# Patient Record
Sex: Male | Born: 1945 | Race: White | Hispanic: No | Marital: Single | Smoking: Former smoker
Health system: Southern US, Community
[De-identification: ages and names within clinical notes are randomized; demographics above are authoritative.]

## PROBLEM LIST (undated history)

## (undated) DIAGNOSIS — R601 Generalized edema: Secondary | ICD-10-CM

## (undated) DIAGNOSIS — E785 Hyperlipidemia, unspecified: Secondary | ICD-10-CM

## (undated) DIAGNOSIS — N400 Enlarged prostate without lower urinary tract symptoms: Secondary | ICD-10-CM

## (undated) DIAGNOSIS — I10 Essential (primary) hypertension: Secondary | ICD-10-CM

## (undated) DIAGNOSIS — G629 Polyneuropathy, unspecified: Secondary | ICD-10-CM

## (undated) DIAGNOSIS — K7460 Unspecified cirrhosis of liver: Secondary | ICD-10-CM

## (undated) HISTORY — DX: Polyneuropathy, unspecified: G62.9

## (undated) HISTORY — PX: UMBILICAL HERNIA REPAIR: SHX196

## (undated) HISTORY — DX: Unspecified cirrhosis of liver: K74.60

## (undated) HISTORY — DX: Hyperlipidemia, unspecified: E78.5

## (undated) HISTORY — DX: Benign prostatic hyperplasia without lower urinary tract symptoms: N40.0

## (undated) HISTORY — PX: HAND SURGERY: SHX662

## (undated) HISTORY — PX: OTHER SURGICAL HISTORY: SHX169

## (undated) HISTORY — DX: Essential (primary) hypertension: I10

---

## 2011-08-24 HISTORY — PX: COLONOSCOPY: SHX174

## 2015-04-27 DIAGNOSIS — I10 Essential (primary) hypertension: Secondary | ICD-10-CM | POA: Diagnosis not present

## 2015-04-27 DIAGNOSIS — Z6832 Body mass index (BMI) 32.0-32.9, adult: Secondary | ICD-10-CM | POA: Diagnosis not present

## 2015-04-27 DIAGNOSIS — E784 Other hyperlipidemia: Secondary | ICD-10-CM | POA: Diagnosis not present

## 2015-06-01 DIAGNOSIS — I10 Essential (primary) hypertension: Secondary | ICD-10-CM | POA: Diagnosis not present

## 2015-06-01 DIAGNOSIS — E785 Hyperlipidemia, unspecified: Secondary | ICD-10-CM | POA: Diagnosis not present

## 2015-06-01 DIAGNOSIS — R011 Cardiac murmur, unspecified: Secondary | ICD-10-CM | POA: Diagnosis not present

## 2015-06-01 DIAGNOSIS — M1990 Unspecified osteoarthritis, unspecified site: Secondary | ICD-10-CM | POA: Diagnosis not present

## 2015-06-01 DIAGNOSIS — E784 Other hyperlipidemia: Secondary | ICD-10-CM | POA: Diagnosis not present

## 2015-06-01 DIAGNOSIS — N200 Calculus of kidney: Secondary | ICD-10-CM | POA: Diagnosis not present

## 2015-06-01 DIAGNOSIS — R310 Gross hematuria: Secondary | ICD-10-CM | POA: Diagnosis not present

## 2015-06-01 DIAGNOSIS — I2510 Atherosclerotic heart disease of native coronary artery without angina pectoris: Secondary | ICD-10-CM | POA: Diagnosis not present

## 2015-06-02 DIAGNOSIS — R310 Gross hematuria: Secondary | ICD-10-CM | POA: Diagnosis not present

## 2015-06-02 DIAGNOSIS — N200 Calculus of kidney: Secondary | ICD-10-CM | POA: Diagnosis not present

## 2015-06-02 DIAGNOSIS — I2510 Atherosclerotic heart disease of native coronary artery without angina pectoris: Secondary | ICD-10-CM | POA: Diagnosis not present

## 2015-06-02 DIAGNOSIS — I10 Essential (primary) hypertension: Secondary | ICD-10-CM | POA: Diagnosis not present

## 2015-06-02 DIAGNOSIS — M1990 Unspecified osteoarthritis, unspecified site: Secondary | ICD-10-CM | POA: Diagnosis not present

## 2015-06-02 DIAGNOSIS — E784 Other hyperlipidemia: Secondary | ICD-10-CM | POA: Diagnosis not present

## 2015-06-02 DIAGNOSIS — E785 Hyperlipidemia, unspecified: Secondary | ICD-10-CM | POA: Diagnosis not present

## 2015-06-09 DIAGNOSIS — R310 Gross hematuria: Secondary | ICD-10-CM | POA: Diagnosis not present

## 2015-06-09 DIAGNOSIS — Z6832 Body mass index (BMI) 32.0-32.9, adult: Secondary | ICD-10-CM | POA: Diagnosis not present

## 2015-06-09 DIAGNOSIS — I2510 Atherosclerotic heart disease of native coronary artery without angina pectoris: Secondary | ICD-10-CM | POA: Diagnosis not present

## 2015-06-09 DIAGNOSIS — M160 Bilateral primary osteoarthritis of hip: Secondary | ICD-10-CM | POA: Diagnosis not present

## 2015-07-14 DIAGNOSIS — N401 Enlarged prostate with lower urinary tract symptoms: Secondary | ICD-10-CM | POA: Diagnosis not present

## 2015-07-27 DIAGNOSIS — I10 Essential (primary) hypertension: Secondary | ICD-10-CM | POA: Diagnosis not present

## 2015-07-27 DIAGNOSIS — L308 Other specified dermatitis: Secondary | ICD-10-CM | POA: Diagnosis not present

## 2015-07-27 DIAGNOSIS — Z6832 Body mass index (BMI) 32.0-32.9, adult: Secondary | ICD-10-CM | POA: Diagnosis not present

## 2015-10-26 DIAGNOSIS — Z6831 Body mass index (BMI) 31.0-31.9, adult: Secondary | ICD-10-CM | POA: Diagnosis not present

## 2015-10-26 DIAGNOSIS — Z1389 Encounter for screening for other disorder: Secondary | ICD-10-CM | POA: Diagnosis not present

## 2015-10-26 DIAGNOSIS — I10 Essential (primary) hypertension: Secondary | ICD-10-CM | POA: Diagnosis not present

## 2015-10-26 DIAGNOSIS — Z0000 Encounter for general adult medical examination without abnormal findings: Secondary | ICD-10-CM | POA: Diagnosis not present

## 2015-11-03 DIAGNOSIS — Z136 Encounter for screening for cardiovascular disorders: Secondary | ICD-10-CM | POA: Diagnosis not present

## 2015-11-03 DIAGNOSIS — Z87891 Personal history of nicotine dependence: Secondary | ICD-10-CM | POA: Diagnosis not present

## 2015-11-03 DIAGNOSIS — R1084 Generalized abdominal pain: Secondary | ICD-10-CM | POA: Diagnosis not present

## 2015-12-09 DIAGNOSIS — E7800 Pure hypercholesterolemia, unspecified: Secondary | ICD-10-CM | POA: Diagnosis not present

## 2015-12-09 DIAGNOSIS — M85852 Other specified disorders of bone density and structure, left thigh: Secondary | ICD-10-CM | POA: Diagnosis not present

## 2015-12-09 DIAGNOSIS — M85851 Other specified disorders of bone density and structure, right thigh: Secondary | ICD-10-CM | POA: Diagnosis not present

## 2015-12-09 DIAGNOSIS — I10 Essential (primary) hypertension: Secondary | ICD-10-CM | POA: Diagnosis not present

## 2015-12-09 DIAGNOSIS — M810 Age-related osteoporosis without current pathological fracture: Secondary | ICD-10-CM | POA: Diagnosis not present

## 2015-12-09 DIAGNOSIS — Z79899 Other long term (current) drug therapy: Secondary | ICD-10-CM | POA: Diagnosis not present

## 2015-12-09 DIAGNOSIS — M1990 Unspecified osteoarthritis, unspecified site: Secondary | ICD-10-CM | POA: Diagnosis not present

## 2016-01-28 DIAGNOSIS — E784 Other hyperlipidemia: Secondary | ICD-10-CM | POA: Diagnosis not present

## 2016-01-28 DIAGNOSIS — I10 Essential (primary) hypertension: Secondary | ICD-10-CM | POA: Diagnosis not present

## 2016-01-28 DIAGNOSIS — Z6830 Body mass index (BMI) 30.0-30.9, adult: Secondary | ICD-10-CM | POA: Diagnosis not present

## 2016-04-14 DIAGNOSIS — M25561 Pain in right knee: Secondary | ICD-10-CM | POA: Diagnosis not present

## 2016-04-22 DIAGNOSIS — M1712 Unilateral primary osteoarthritis, left knee: Secondary | ICD-10-CM | POA: Diagnosis not present

## 2016-05-09 DIAGNOSIS — N200 Calculus of kidney: Secondary | ICD-10-CM | POA: Diagnosis not present

## 2016-05-09 DIAGNOSIS — J440 Chronic obstructive pulmonary disease with acute lower respiratory infection: Secondary | ICD-10-CM | POA: Diagnosis not present

## 2016-05-09 DIAGNOSIS — I10 Essential (primary) hypertension: Secondary | ICD-10-CM | POA: Diagnosis not present

## 2016-05-09 DIAGNOSIS — Z6830 Body mass index (BMI) 30.0-30.9, adult: Secondary | ICD-10-CM | POA: Diagnosis not present

## 2016-05-09 DIAGNOSIS — E784 Other hyperlipidemia: Secondary | ICD-10-CM | POA: Diagnosis not present

## 2016-05-09 DIAGNOSIS — N4289 Other specified disorders of prostate: Secondary | ICD-10-CM | POA: Diagnosis not present

## 2016-05-09 DIAGNOSIS — Z125 Encounter for screening for malignant neoplasm of prostate: Secondary | ICD-10-CM | POA: Diagnosis not present

## 2016-07-26 DIAGNOSIS — Z6831 Body mass index (BMI) 31.0-31.9, adult: Secondary | ICD-10-CM | POA: Diagnosis not present

## 2016-07-26 DIAGNOSIS — M5432 Sciatica, left side: Secondary | ICD-10-CM | POA: Diagnosis not present

## 2016-07-26 DIAGNOSIS — I10 Essential (primary) hypertension: Secondary | ICD-10-CM | POA: Diagnosis not present

## 2016-07-26 DIAGNOSIS — J440 Chronic obstructive pulmonary disease with acute lower respiratory infection: Secondary | ICD-10-CM | POA: Diagnosis not present

## 2016-08-12 DIAGNOSIS — M5432 Sciatica, left side: Secondary | ICD-10-CM | POA: Diagnosis not present

## 2016-08-12 DIAGNOSIS — I10 Essential (primary) hypertension: Secondary | ICD-10-CM | POA: Diagnosis not present

## 2016-08-12 DIAGNOSIS — Z6831 Body mass index (BMI) 31.0-31.9, adult: Secondary | ICD-10-CM | POA: Diagnosis not present

## 2016-08-12 DIAGNOSIS — J440 Chronic obstructive pulmonary disease with acute lower respiratory infection: Secondary | ICD-10-CM | POA: Diagnosis not present

## 2016-09-27 ENCOUNTER — Telehealth: Payer: Self-pay

## 2016-09-27 NOTE — Telephone Encounter (Signed)
Pt was referred from the [place] for colonoscopy.  Said his last colonoscopy was 5 years ago and he had polyps. I have scheduled him an OV with Neil Crouch, PA on 11/10/2016 at 11:00 Am.  I called the number on the referral [phone] and spoke to [employer]. He always sends authorization number on his referrals. Authorization # [id number] ( on page 3 of the referral).  He is aware of the date and time of the pt's appt.

## 2016-09-27 NOTE — Telephone Encounter (Signed)
[phone] PATIENT READY TO SCHEDULE TCS

## 2016-11-10 ENCOUNTER — Encounter: Payer: Self-pay | Admitting: Gastroenterology

## 2016-11-10 ENCOUNTER — Other Ambulatory Visit: Payer: Self-pay

## 2016-11-10 ENCOUNTER — Ambulatory Visit (INDEPENDENT_AMBULATORY_CARE_PROVIDER_SITE_OTHER): Payer: Non-veteran care | Admitting: Gastroenterology

## 2016-11-10 DIAGNOSIS — Z86010 Personal history of colonic polyps: Secondary | ICD-10-CM | POA: Diagnosis not present

## 2016-11-10 MED ORDER — PEG 3350-KCL-NA BICARB-NACL 420 G PO SOLR: 4000.0000 mL | ORAL | 0 refills | Status: DC

## 2016-11-10 NOTE — Patient Instructions (Signed)
1. Colonoscopy as scheduled. See separate instructions.  

## 2016-11-10 NOTE — Assessment & Plan Note (Signed)
Colonoscopy in near future.  I have discussed the risks, alternatives, benefits with regards to but not limited to the risk of reaction to medication, bleeding, infection, perforation and the patient is agreeable to proceed. Written consent to be obtained.

## 2016-11-10 NOTE — Progress Notes (Signed)
cc'ed to pcp °

## 2016-11-10 NOTE — Progress Notes (Signed)
Primary Care Physician:  [facility]  Primary Gastroenterologist:  Garfield Cornea, MD   Chief Complaint  Patient presents with  . Colonoscopy    hx polyps, last tcs 5 yrs ago    HPI:  Mike Moreno is a 71 y.o. male here to schedule surveillance colonoscopy. Last colonoscopy in May 2013, tubular adenomatous polyps 2 removed. Recommended 5 year follow-up. He is referral from the [place] system. Clinically doing well from GI standpoint. No heartburn, dysphagia, vomiting, unintentional weight loss, abd pain, constipation, diarrhea, melena, brbpr. He has small umbilical hernia and bilateral inguinal hernias with plans for repair after his colonoscopy.   Current Outpatient Prescriptions  Medication Sig Dispense Refill  . atorvastatin (LIPITOR) 80 MG tablet Take 40 mg by mouth daily.    . benazepril (LOTENSIN) 5 MG tablet Take 5 mg by mouth daily.    . ERGOCALCIFEROL PO Take by mouth once a week.    . finasteride (PROSCAR) 5 MG tablet Take 5 mg by mouth daily.    Marland Kitchen tiZANidine (ZANAFLEX) 4 MG capsule Take 4 mg by mouth at bedtime.     No current facility-administered medications for this visit.     Allergies as of 11/10/2016  . (No Known Allergies)    Past Medical History:  Diagnosis Date  . BPH (benign prostatic hyperplasia)   . HTN (hypertension)   . Hyperlipidemia     Past Surgical History:  Procedure Laterality Date  . COLONOSCOPY  08/2011   According to [facility] notes, 2 tubular adenomatous colon polyps removed next surveillance colonoscopy in 5 years  . HAND SURGERY     traumatic injury    Family History  Problem Relation Age of Onset  . Colon cancer Neg Hx     Social History   Social History  . Marital status: Single    Spouse name: N/A  . Number of children: N/A  . Years of education: N/A   Occupational History  . Not on file.   Social History Main Topics  . Smoking status: Former Smoker    Types: Cigarettes, Cigars  . Smokeless tobacco: Never Used      Comment: quit 2015  . Alcohol use No     Comment: some etoh 20 years ago  . Drug use: No  . Sexual activity: Not on file   Other Topics Concern  . Not on file   Social History Narrative  . No narrative on file      ROS:  General: Negative for anorexia, weight loss, fever, chills, fatigue, weakness. Eyes: Negative for vision changes.  ENT: Negative for hoarseness, difficulty swallowing , nasal congestion. CV: Negative for chest pain, angina, palpitations, dyspnea on exertion, peripheral edema.  Respiratory: Negative for dyspnea at rest, dyspnea on exertion, cough, sputum, wheezing.  GI: See history of present illness. GU:  Negative for dysuria, hematuria, urinary incontinence, urinary frequency, nocturnal urination.  MS: Negative for jlow back pain. +arthritis Derm: Negative for rash or itching.  Neuro: Negative for weakness, abnormal sensation, seizure, frequent headaches, memory loss, confusion.  Psych: Negative for anxiety, depression, suicidal ideation, hallucinations.  Endo: Negative for unusual weight change.  Heme: Negative for bruising or bleeding. Allergy: Negative for rash or hives.    Physical Examination:  BP (!) 145/82   Pulse 71   Temp (!) 97 F (36.1 C) (Oral)   Ht 5\' 11"  (1.803 m)   Wt 225 lb (102.1 kg)   BMI 31.38 kg/m    General: Well-nourished,  well-developed in no acute distress.  Head: Normocephalic, atraumatic.   Eyes: Conjunctiva pink, no icterus. Mouth: Oropharyngeal mucosa moist and pink , no lesions erythema or exudate. Neck: Supple without thyromegaly, masses, or lymphadenopathy.  Lungs: Clear to auscultation bilaterally.  Heart: Regular rate and rhythm, no murmurs rubs or gallops.  Abdomen: Bowel sounds are normal, nontender, nondistended, no hepatosplenomegaly or masses, no abdominal bruits or    hernia , no rebound or guarding.   Rectal: deferred Extremities: No lower extremity edema. No clubbing or deformities.  Neuro: Alert and  oriented x 4 , grossly normal neurologically.  Skin: Warm and dry, no rash or jaundice.   Psych: Alert and cooperative, normal mood and affect.

## 2016-11-14 ENCOUNTER — Telehealth: Payer: Self-pay | Admitting: General Practice

## 2016-11-14 NOTE — Telephone Encounter (Signed)
Patient called back and stated he found his license

## 2016-11-14 NOTE — Telephone Encounter (Signed)
Patient called in stating he was here as a New Patient on 7/19th and we scanned in his Driver's license.  He has not been able to find them.  Routing to [place] to check at her desk to see if we have them.  The patient can be reached at [phone].

## 2016-11-21 DIAGNOSIS — I10 Essential (primary) hypertension: Secondary | ICD-10-CM | POA: Diagnosis not present

## 2016-11-21 DIAGNOSIS — Z6832 Body mass index (BMI) 32.0-32.9, adult: Secondary | ICD-10-CM | POA: Diagnosis not present

## 2016-11-21 DIAGNOSIS — M5432 Sciatica, left side: Secondary | ICD-10-CM | POA: Diagnosis not present

## 2016-11-21 DIAGNOSIS — J440 Chronic obstructive pulmonary disease with acute lower respiratory infection: Secondary | ICD-10-CM | POA: Diagnosis not present

## 2016-11-21 DIAGNOSIS — M12861 Other specific arthropathies, not elsewhere classified, right knee: Secondary | ICD-10-CM | POA: Diagnosis not present

## 2016-12-07 ENCOUNTER — Encounter (HOSPITAL_COMMUNITY): Payer: Self-pay | Admitting: *Deleted

## 2016-12-07 ENCOUNTER — Encounter (HOSPITAL_COMMUNITY)
Admission: RE | Disposition: A | Discharge status: Home / Self Care | Payer: Self-pay | Source: Ambulatory Visit | Attending: Internal Medicine

## 2016-12-07 ENCOUNTER — Ambulatory Visit (HOSPITAL_COMMUNITY)
Admission: RE | Admit: 2016-12-07 | Discharge: 2016-12-07 | Disposition: A | Discharge status: Home / Self Care | Payer: Non-veteran care | Source: Ambulatory Visit | Attending: Internal Medicine | Admitting: Internal Medicine

## 2016-12-07 DIAGNOSIS — K6389 Other specified diseases of intestine: Secondary | ICD-10-CM | POA: Diagnosis not present

## 2016-12-07 DIAGNOSIS — Z9889 Other specified postprocedural states: Secondary | ICD-10-CM | POA: Insufficient documentation

## 2016-12-07 DIAGNOSIS — K621 Rectal polyp: Secondary | ICD-10-CM | POA: Insufficient documentation

## 2016-12-07 DIAGNOSIS — Z79899 Other long term (current) drug therapy: Secondary | ICD-10-CM | POA: Diagnosis not present

## 2016-12-07 DIAGNOSIS — N400 Enlarged prostate without lower urinary tract symptoms: Secondary | ICD-10-CM | POA: Insufficient documentation

## 2016-12-07 DIAGNOSIS — E785 Hyperlipidemia, unspecified: Secondary | ICD-10-CM | POA: Diagnosis not present

## 2016-12-07 DIAGNOSIS — Z87891 Personal history of nicotine dependence: Secondary | ICD-10-CM | POA: Diagnosis not present

## 2016-12-07 DIAGNOSIS — I10 Essential (primary) hypertension: Secondary | ICD-10-CM | POA: Insufficient documentation

## 2016-12-07 DIAGNOSIS — Z1211 Encounter for screening for malignant neoplasm of colon: Secondary | ICD-10-CM | POA: Insufficient documentation

## 2016-12-07 DIAGNOSIS — Z86010 Personal history of colonic polyps: Secondary | ICD-10-CM | POA: Diagnosis not present

## 2016-12-07 HISTORY — PX: BIOPSY: SHX5522

## 2016-12-07 HISTORY — PX: POLYPECTOMY: SHX5525

## 2016-12-07 HISTORY — PX: COLONOSCOPY: SHX5424

## 2016-12-07 SURGERY — COLONOSCOPY
Anesthesia: Moderate Sedation

## 2016-12-07 MED ORDER — ONDANSETRON HCL 4 MG/2ML IJ SOLN
INTRAMUSCULAR | Status: DC | PRN
  Administered 2016-12-07: 4 mg via INTRAVENOUS

## 2016-12-07 MED ORDER — MEPERIDINE HCL 100 MG/ML IJ SOLN
INTRAMUSCULAR | Status: DC | PRN
  Administered 2016-12-07: 50 mg via INTRAVENOUS
  Administered 2016-12-07: 25 mg via INTRAVENOUS

## 2016-12-07 MED ORDER — MEPERIDINE HCL 100 MG/ML IJ SOLN
INTRAMUSCULAR | Status: DC
Start: 2016-12-07 — End: 2016-12-07
  Filled 2016-12-07: qty 2

## 2016-12-07 MED ORDER — ONDANSETRON HCL 4 MG/2ML IJ SOLN
INTRAMUSCULAR | Status: AC
  Filled 2016-12-07: qty 2

## 2016-12-07 MED ORDER — MIDAZOLAM HCL 5 MG/5ML IJ SOLN
INTRAMUSCULAR | Status: AC
  Filled 2016-12-07: qty 10

## 2016-12-07 MED ORDER — SODIUM CHLORIDE 0.9 % IV SOLN
INTRAVENOUS | Status: DC
  Administered 2016-12-07: 1000 mL via INTRAVENOUS

## 2016-12-07 MED ORDER — MIDAZOLAM HCL 5 MG/5ML IJ SOLN
INTRAMUSCULAR | Status: DC | PRN
  Administered 2016-12-07: 1 mg via INTRAVENOUS
  Administered 2016-12-07 (×2): 2 mg via INTRAVENOUS

## 2016-12-07 MED ORDER — SIMETHICONE 40 MG/0.6ML PO SUSP
ORAL | Status: DC | PRN
  Administered 2016-12-07: 10:00:00

## 2016-12-07 NOTE — Op Note (Signed)
[facility] Patient Name: Mike Moreno Procedure Date: 12/07/2016 9:51 AM MRN: [id number] Date of Birth: [birth year] Attending MD: Norvel Richards , MD CSN: [id number] Age: 71 Admit Type: Outpatient Procedure:                Colonoscopy Indications:              High risk colon cancer surveillance: Personal                            history of colonic polyps Providers:                Norvel Richards, MD, Otis Peak B. Gwenlyn Perking RN, RN,                            Aram Candela Referring MD:              Medicines:                Midazolam 5 mg IV, Meperidine 75 mg IV Complications:            No immediate complications. Estimated Blood Loss:     Estimated blood loss was minimal. Procedure:                Pre-Anesthesia Assessment:                           - Prior to the procedure, a History and Physical                            was performed, and patient medications and                            allergies were reviewed. The patient's tolerance of                            previous anesthesia was also reviewed. The risks                            and benefits of the procedure and the sedation                            options and risks were discussed with the patient.                            All questions were answered, and informed consent                            was obtained. Prior Anticoagulants: The patient has                            taken no previous anticoagulant or antiplatelet                            agents. ASA Grade Assessment: III - A patient with  severe systemic disease. After reviewing the risks                            and benefits, the patient was deemed in                            satisfactory condition to undergo the procedure.                           After obtaining informed consent, the colonoscope                            was passed under direct vision. Throughout the                             procedure, the patient's blood pressure, pulse, and                            oxygen saturations were monitored continuously. The                            EC-3890Li ([id number]) scope was introduced through                            the anus and advanced to the 5 cm into the ileum.                            The terminal ileum, ileocecal valve, appendiceal                            orifice, and rectum were photographed. The entire                            colon was well visualized. The colonoscopy was                            performed without difficulty. The patient tolerated                            the procedure well. The quality of the bowel                            preparation was adequate. Scope In: 10:21:03 AM Scope Out: 10:40:22 AM Scope Withdrawal Time: 0 hours 15 minutes 38 seconds  Total Procedure Duration: 0 hours 19 minutes 19 seconds  Findings:      The perianal and digital rectal examinations were normal.      A 4 mm polyp was found in the rectum. The polyp was sessile.      A localized area of mucosa in the ileocecal valve was nodular. This was       biopsied with a cold forceps for histology. Estimated blood loss was       minimal.      The exam was otherwise without abnormality on direct and retroflexion  views. . The polyp was removed with a cold snare. Resection and       retrieval were complete. Estimated blood loss was minimal Impression:               - One 4 mm polyp in the rectum, removed with a cold                            snare. Resected and retrieved.                           - Nodular ileal mucosa. Biopsied.                           - The examination was otherwise normal on direct                            and retroflexion views. Moderate Sedation:      Moderate (conscious) sedation was administered by the endoscopy nurse       and supervised by the endoscopist. The following parameters were       monitored: oxygen saturation, heart  rate, blood pressure, respiratory       rate, EKG, adequacy of pulmonary ventilation, and response to care.       Total physician intraservice time was 37 minutes. Recommendation:           - Written discharge instructions were provided to                            the patient.                           - The signs and symptoms of potential delayed                            complications were discussed with the patient.                           - Patient has a contact number available for                            emergencies.                           - Return to normal activities tomorrow.                           - Resume previous diet.                           - Continue present medications.                           - Repeat colonoscopy in 5 years for surveillance                            based on pathology results.                           -  Return to GI office (date not yet determined).                           - Repeat colonoscopy date to be determined after                            pending pathology results are reviewed for                            surveillance based on pathology results. Procedure Code(s):        --- Professional ---                           [phone], Colonoscopy, flexible; with removal of                            tumor(s), polyp(s), or other lesion(s) by snare                            technique                           45380, 59, Colonoscopy, flexible; with biopsy,                            single or multiple                           99152, Moderate sedation services provided by the                            same physician or other qualified health care                            professional performing the diagnostic or                            therapeutic service that the sedation supports,                            requiring the presence of an independent trained                            observer to assist in the monitoring of the                             patient's level of consciousness and physiological                            status; initial 15 minutes of intraservice time,                            patient age 70 years or older  99153, Moderate sedation services; each additional                            15 minutes intraservice time Diagnosis Code(s):        --- Professional ---                           Z86.010, Personal history of colonic polyps                           K62.1, Rectal polyp                           K63.89, Other specified diseases of intestine CPT copyright 2016 American Medical Association. All rights reserved. The codes documented in this report are preliminary and upon coder review may  be revised to meet current compliance requirements. Cristopher Estimable. Kristain Hu, MD Norvel Richards, MD 12/07/2016 10:50:50 AM This report has been signed electronically. Number of Addenda: 0

## 2016-12-07 NOTE — H&P (Signed)
@LOGO @   Primary Care Physician:  [facility] [place] Primary Gastroenterologist:  Dr. Gala Romney  Pre-Procedure History & Physical: HPI:  Mike Moreno is a 71 y.o. male is here for a surveillance colonoscopy. History of colon polyps removed a colonoscopy 5 years ago in [place]. Her bowel symptoms currently. Here for surveillance examination.  Past Medical History:  Diagnosis Date  . BPH (benign prostatic hyperplasia)   . HTN (hypertension)   . Hyperlipidemia     Past Surgical History:  Procedure Laterality Date  . COLONOSCOPY  08/2011   According to [facility] notes, 2 tubular adenomatous colon polyps removed next surveillance colonoscopy in 5 years  . HAND SURGERY     traumatic injury    Prior to Admission medications   Medication Sig Start Date End Date Taking? Authorizing Provider  atorvastatin (LIPITOR) 40 MG tablet Take 40 mg by mouth every evening.   Yes [provider]  benazepril (LOTENSIN) 5 MG tablet Take 5 mg by mouth daily.   Yes [provider]  finasteride (PROSCAR) 5 MG tablet Take 5 mg by mouth daily.   Yes [provider]  polyethylene glycol-electrolytes (TRILYTE) 420 g solution Take 4,000 mLs by mouth as directed. 11/10/16  Yes Gerrell Tabet, Cristopher Estimable, MD  tiZANidine (ZANAFLEX) 4 MG tablet Take 4 mg by mouth at bedtime. 11/01/16  Yes [provider]  Vitamin D, Ergocalciferol, (DRISDOL) 50000 units CAPS capsule Take 50,000 Units by mouth every Wednesday.   Yes [provider]    Allergies as of 11/10/2016  . (No Known Allergies)    Family History  Problem Relation Age of Onset  . Colon cancer Neg Hx     Social History   Social History  . Marital status: Single    Spouse name: N/A  . Number of children: N/A  . Years of education: N/A   Occupational History  . Not on file.   Social History Main Topics  . Smoking status: Former Smoker    Types: Cigarettes, Cigars  . Smokeless tobacco: Never Used   Comment: quit 2015  . Alcohol use No     Comment: some etoh 20 years ago  . Drug use: No     Comment: not since 2005  . Sexual activity: Not on file   Other Topics Concern  . Not on file   Social History Narrative  . No narrative on file    Review of Systems: See HPI, otherwise negative ROS  Physical Exam: BP (!) 155/77   Pulse 74   Temp 98.7 F (37.1 C) (Oral)   Resp 20   Ht 5\' 11"  (1.803 m)   Wt 214 lb (97.1 kg)   SpO2 99%   BMI 29.85 kg/m  General:   Alert,  Well-developed, well-nourished, pleasant and cooperative in NAD Lungs:  Clear throughout to auscultation.   No wheezes, crackles, or rhonchi. No acute distress. Heart:  Regular rate and rhythm; no murmurs, clicks, rubs,  or gallops. Abdomen:  Soft, nontender and nondistended. No masses, hepatosplenomegaly or hernias noted. Normal bowel sounds, without guarding, and without rebound.    Impression/Plan: Mike Moreno is now here to undergo a Surveillance colonoscopy. Risks, benefits, limitations, imponderables and alternatives regarding colonoscopy have been reviewed with the patient. Questions have been answered. All parties agreeable.        Notice:  This dictation was prepared with Dragon dictation along with smaller phrase technology. Any transcriptional errors that result from this process are unintentional  and may not be corrected upon review.

## 2016-12-07 NOTE — Discharge Instructions (Addendum)
Colon Polyps °Polyps are tissue growths inside the body. Polyps can grow in many places, including the large intestine (colon). A polyp may be a round bump or a mushroom-shaped growth. You could have one polyp or several. °Most colon polyps are noncancerous (benign). However, some colon polyps can become cancerous over time. °What are the causes? °The exact cause of colon polyps is not known. °What increases the risk? °This condition is more likely to develop in people who: °· Have a family history of colon cancer or colon polyps. °· Are older than 50 or older than 45 if they are African American. °· Have inflammatory bowel disease, such as ulcerative colitis or Crohn disease. °· Are overweight. °· Smoke cigarettes. °· Do not get enough exercise. °· Drink too much alcohol. °· Eat a diet that is: °? High in fat and red meat. °? Low in fiber. °· Had childhood cancer that was treated with abdominal radiation. ° °What are the signs or symptoms? °Most polyps do not cause symptoms. If you have symptoms, they may include: °· Blood coming from your rectum when having a bowel movement. °· Blood in your stool. The stool may look dark red or black. °· A change in bowel habits, such as constipation or diarrhea. ° °How is this diagnosed? °This condition is diagnosed with a colonoscopy. This is a procedure that uses a lighted, flexible scope to look at the inside of your colon. °How is this treated? °Treatment for this condition involves removing any polyps that are found. Those polyps will then be tested for cancer. If cancer is found, your health care provider will talk to you about options for colon cancer treatment. °Follow these instructions at home: °Diet °· Eat plenty of fiber, such as fruits, vegetables, and whole grains. °· Eat foods that are high in calcium and vitamin D, such as milk, cheese, yogurt, eggs, liver, fish, and broccoli. °· Limit foods high in fat, red meats, and processed meats, such as hot dogs, sausage,  bacon, and lunch meats. °· Maintain a healthy weight, or lose weight if recommended by your health care provider. °General instructions °· Do not smoke cigarettes. °· Do not drink alcohol excessively. °· Keep all follow-up visits as told by your health care provider. This is important. This includes keeping regularly scheduled colonoscopies. Talk to your health care provider about when you need a colonoscopy. °· Exercise every day or as told by your health care provider. °Contact a health care provider if: °· You have new or worsening bleeding during a bowel movement. °· You have new or increased blood in your stool. °· You have a change in bowel habits. °· You unexpectedly lose weight. °This information is not intended to replace advice given to you by your health care provider. Make sure you discuss any questions you have with your health care provider. °Document Released: 01/06/2004 Document Revised: 09/17/2015 Document Reviewed: 03/02/2015 °Elsevier Interactive Patient Education © 2018 [employer] ° °Colonoscopy °Discharge Instructions ° °Read the instructions outlined below and refer to this sheet in the next few weeks. These discharge instructions provide you with general information on caring for yourself after you leave the hospital. Your doctor may also give you specific instructions. While your treatment has been planned according to the most current medical practices available, unavoidable complications occasionally occur. If you have any problems or questions after discharge, call Dr. Alwin Lanigan at [phone]. °ACTIVITY °· You may resume your regular activity, but move at a slower pace for the next 24   hours.   Take frequent rest periods for the next 24 hours.   Walking will help get rid of the air and reduce the bloated feeling in your belly (abdomen).   No driving for 24 hours (because of the medicine (anesthesia) used during the test).    Do not sign any important legal documents or operate any  machinery for 24 hours (because of the anesthesia used during the test).  NUTRITION  Drink plenty of fluids.   You may resume your normal diet as instructed by your doctor.   Begin with a light meal and progress to your normal diet. Heavy or fried foods are harder to digest and may make you feel sick to your stomach (nauseated).   Avoid alcoholic beverages for 24 hours or as instructed.  MEDICATIONS  You may resume your normal medications unless your doctor tells you otherwise.  WHAT YOU CAN EXPECT TODAY  Some feelings of bloating in the abdomen.   Passage of more gas than usual.   Spotting of blood in your stool or on the toilet paper.  IF YOU HAD POLYPS REMOVED DURING THE COLONOSCOPY:  No aspirin products for 7 days or as instructed.   No alcohol for 7 days or as instructed.   Eat a soft diet for the next 24 hours.  FINDING OUT THE RESULTS OF YOUR TEST Not all test results are available during your visit. If your test results are not back during the visit, make an appointment with your caregiver to find out the results. Do not assume everything is normal if you have not heard from your caregiver or the medical facility. It is important for you to follow up on all of your test results.  SEEK IMMEDIATE MEDICAL ATTENTION IF:  You have more than a spotting of blood in your stool.   Your belly is swollen (abdominal distention).   You are nauseated or vomiting.   You have a temperature over 101.   You have abdominal pain or discomfort that is severe or gets worse throughout the day.    Colon polyp information provided - one small polyp removed from your rectum and mild nodularity where your small intestine next to your colon  -  biopsied  Further recommendations to follow pending review of pathology report

## 2016-12-08 ENCOUNTER — Encounter (HOSPITAL_COMMUNITY): Payer: Self-pay | Admitting: Internal Medicine

## 2016-12-09 ENCOUNTER — Encounter: Payer: Self-pay | Admitting: Internal Medicine

## 2017-02-03 DIAGNOSIS — R609 Edema, unspecified: Secondary | ICD-10-CM | POA: Diagnosis not present

## 2017-02-03 DIAGNOSIS — E7800 Pure hypercholesterolemia, unspecified: Secondary | ICD-10-CM | POA: Diagnosis not present

## 2017-02-03 DIAGNOSIS — M1990 Unspecified osteoarthritis, unspecified site: Secondary | ICD-10-CM | POA: Diagnosis not present

## 2017-02-03 DIAGNOSIS — R600 Localized edema: Secondary | ICD-10-CM | POA: Diagnosis not present

## 2017-02-03 DIAGNOSIS — R109 Unspecified abdominal pain: Secondary | ICD-10-CM | POA: Diagnosis not present

## 2017-02-03 DIAGNOSIS — Z79899 Other long term (current) drug therapy: Secondary | ICD-10-CM | POA: Diagnosis not present

## 2017-02-03 DIAGNOSIS — K5900 Constipation, unspecified: Secondary | ICD-10-CM | POA: Diagnosis not present

## 2017-02-03 DIAGNOSIS — Z87891 Personal history of nicotine dependence: Secondary | ICD-10-CM | POA: Diagnosis not present

## 2017-02-03 DIAGNOSIS — R300 Dysuria: Secondary | ICD-10-CM | POA: Diagnosis not present

## 2017-02-27 DIAGNOSIS — J441 Chronic obstructive pulmonary disease with (acute) exacerbation: Secondary | ICD-10-CM | POA: Diagnosis not present

## 2017-02-27 DIAGNOSIS — J440 Chronic obstructive pulmonary disease with acute lower respiratory infection: Secondary | ICD-10-CM | POA: Diagnosis not present

## 2017-02-27 DIAGNOSIS — I10 Essential (primary) hypertension: Secondary | ICD-10-CM | POA: Diagnosis not present

## 2017-02-27 DIAGNOSIS — Z6831 Body mass index (BMI) 31.0-31.9, adult: Secondary | ICD-10-CM | POA: Diagnosis not present

## 2017-02-27 DIAGNOSIS — E7849 Other hyperlipidemia: Secondary | ICD-10-CM | POA: Diagnosis not present

## 2017-06-05 DIAGNOSIS — E7849 Other hyperlipidemia: Secondary | ICD-10-CM | POA: Diagnosis not present

## 2017-06-05 DIAGNOSIS — Z6832 Body mass index (BMI) 32.0-32.9, adult: Secondary | ICD-10-CM | POA: Diagnosis not present

## 2017-06-05 DIAGNOSIS — Z6831 Body mass index (BMI) 31.0-31.9, adult: Secondary | ICD-10-CM | POA: Diagnosis not present

## 2017-06-05 DIAGNOSIS — J441 Chronic obstructive pulmonary disease with (acute) exacerbation: Secondary | ICD-10-CM | POA: Diagnosis not present

## 2017-06-05 DIAGNOSIS — I10 Essential (primary) hypertension: Secondary | ICD-10-CM | POA: Diagnosis not present

## 2017-06-05 DIAGNOSIS — M170 Bilateral primary osteoarthritis of knee: Secondary | ICD-10-CM | POA: Diagnosis not present

## 2017-09-11 DIAGNOSIS — I10 Essential (primary) hypertension: Secondary | ICD-10-CM | POA: Diagnosis not present

## 2017-09-11 DIAGNOSIS — Z6832 Body mass index (BMI) 32.0-32.9, adult: Secondary | ICD-10-CM | POA: Diagnosis not present

## 2017-09-11 DIAGNOSIS — J441 Chronic obstructive pulmonary disease with (acute) exacerbation: Secondary | ICD-10-CM | POA: Diagnosis not present

## 2017-09-11 DIAGNOSIS — E7849 Other hyperlipidemia: Secondary | ICD-10-CM | POA: Diagnosis not present

## 2017-09-11 DIAGNOSIS — M170 Bilateral primary osteoarthritis of knee: Secondary | ICD-10-CM | POA: Diagnosis not present

## 2017-09-11 DIAGNOSIS — N182 Chronic kidney disease, stage 2 (mild): Secondary | ICD-10-CM | POA: Diagnosis not present

## 2017-12-26 DIAGNOSIS — N182 Chronic kidney disease, stage 2 (mild): Secondary | ICD-10-CM | POA: Diagnosis not present

## 2017-12-26 DIAGNOSIS — Z6832 Body mass index (BMI) 32.0-32.9, adult: Secondary | ICD-10-CM | POA: Diagnosis not present

## 2017-12-26 DIAGNOSIS — Z0000 Encounter for general adult medical examination without abnormal findings: Secondary | ICD-10-CM | POA: Diagnosis not present

## 2017-12-26 DIAGNOSIS — I10 Essential (primary) hypertension: Secondary | ICD-10-CM | POA: Diagnosis not present

## 2017-12-26 DIAGNOSIS — E7849 Other hyperlipidemia: Secondary | ICD-10-CM | POA: Diagnosis not present

## 2017-12-26 DIAGNOSIS — J441 Chronic obstructive pulmonary disease with (acute) exacerbation: Secondary | ICD-10-CM | POA: Diagnosis not present

## 2017-12-26 DIAGNOSIS — J449 Chronic obstructive pulmonary disease, unspecified: Secondary | ICD-10-CM | POA: Diagnosis not present

## 2017-12-26 DIAGNOSIS — M170 Bilateral primary osteoarthritis of knee: Secondary | ICD-10-CM | POA: Diagnosis not present

## 2018-01-03 DIAGNOSIS — I82439 Acute embolism and thrombosis of unspecified popliteal vein: Secondary | ICD-10-CM | POA: Diagnosis not present

## 2018-01-03 DIAGNOSIS — I82432 Acute embolism and thrombosis of left popliteal vein: Secondary | ICD-10-CM | POA: Diagnosis not present

## 2018-02-18 DIAGNOSIS — E785 Hyperlipidemia, unspecified: Secondary | ICD-10-CM | POA: Diagnosis not present

## 2018-02-18 DIAGNOSIS — R079 Chest pain, unspecified: Secondary | ICD-10-CM | POA: Diagnosis not present

## 2018-02-18 DIAGNOSIS — N179 Acute kidney failure, unspecified: Secondary | ICD-10-CM | POA: Diagnosis not present

## 2018-02-18 DIAGNOSIS — I10 Essential (primary) hypertension: Secondary | ICD-10-CM | POA: Diagnosis not present

## 2018-02-18 DIAGNOSIS — I82502 Chronic embolism and thrombosis of unspecified deep veins of left lower extremity: Secondary | ICD-10-CM | POA: Diagnosis not present

## 2018-02-18 DIAGNOSIS — I2510 Atherosclerotic heart disease of native coronary artery without angina pectoris: Secondary | ICD-10-CM | POA: Diagnosis not present

## 2018-02-18 DIAGNOSIS — M1990 Unspecified osteoarthritis, unspecified site: Secondary | ICD-10-CM | POA: Diagnosis not present

## 2018-02-18 DIAGNOSIS — I129 Hypertensive chronic kidney disease with stage 1 through stage 4 chronic kidney disease, or unspecified chronic kidney disease: Secondary | ICD-10-CM | POA: Diagnosis not present

## 2018-02-18 DIAGNOSIS — R0789 Other chest pain: Secondary | ICD-10-CM | POA: Diagnosis not present

## 2018-02-18 DIAGNOSIS — G629 Polyneuropathy, unspecified: Secondary | ICD-10-CM | POA: Diagnosis not present

## 2018-02-18 DIAGNOSIS — R609 Edema, unspecified: Secondary | ICD-10-CM | POA: Diagnosis not present

## 2018-02-18 DIAGNOSIS — Z87891 Personal history of nicotine dependence: Secondary | ICD-10-CM | POA: Diagnosis not present

## 2018-02-18 DIAGNOSIS — Z87442 Personal history of urinary calculi: Secondary | ICD-10-CM | POA: Diagnosis not present

## 2018-02-18 DIAGNOSIS — N182 Chronic kidney disease, stage 2 (mild): Secondary | ICD-10-CM | POA: Diagnosis not present

## 2018-02-19 DIAGNOSIS — R0789 Other chest pain: Secondary | ICD-10-CM | POA: Diagnosis not present

## 2018-02-19 DIAGNOSIS — E785 Hyperlipidemia, unspecified: Secondary | ICD-10-CM | POA: Diagnosis not present

## 2018-02-19 DIAGNOSIS — I129 Hypertensive chronic kidney disease with stage 1 through stage 4 chronic kidney disease, or unspecified chronic kidney disease: Secondary | ICD-10-CM | POA: Diagnosis not present

## 2018-02-19 DIAGNOSIS — N182 Chronic kidney disease, stage 2 (mild): Secondary | ICD-10-CM | POA: Diagnosis not present

## 2018-02-19 DIAGNOSIS — I2510 Atherosclerotic heart disease of native coronary artery without angina pectoris: Secondary | ICD-10-CM | POA: Diagnosis not present

## 2018-02-26 DIAGNOSIS — M169 Osteoarthritis of hip, unspecified: Secondary | ICD-10-CM | POA: Diagnosis not present

## 2018-02-26 DIAGNOSIS — Z1389 Encounter for screening for other disorder: Secondary | ICD-10-CM | POA: Diagnosis not present

## 2018-02-26 DIAGNOSIS — N182 Chronic kidney disease, stage 2 (mild): Secondary | ICD-10-CM | POA: Diagnosis not present

## 2018-02-26 DIAGNOSIS — Z6834 Body mass index (BMI) 34.0-34.9, adult: Secondary | ICD-10-CM | POA: Diagnosis not present

## 2018-02-26 DIAGNOSIS — E782 Mixed hyperlipidemia: Secondary | ICD-10-CM | POA: Diagnosis not present

## 2018-02-26 DIAGNOSIS — I82409 Acute embolism and thrombosis of unspecified deep veins of unspecified lower extremity: Secondary | ICD-10-CM | POA: Diagnosis not present

## 2018-02-26 DIAGNOSIS — Z0000 Encounter for general adult medical examination without abnormal findings: Secondary | ICD-10-CM | POA: Diagnosis not present

## 2018-02-26 DIAGNOSIS — I10 Essential (primary) hypertension: Secondary | ICD-10-CM | POA: Diagnosis not present

## 2018-06-04 DIAGNOSIS — Z6834 Body mass index (BMI) 34.0-34.9, adult: Secondary | ICD-10-CM | POA: Diagnosis not present

## 2018-06-04 DIAGNOSIS — I82409 Acute embolism and thrombosis of unspecified deep veins of unspecified lower extremity: Secondary | ICD-10-CM | POA: Diagnosis not present

## 2018-06-04 DIAGNOSIS — Z0000 Encounter for general adult medical examination without abnormal findings: Secondary | ICD-10-CM | POA: Diagnosis not present

## 2018-06-04 DIAGNOSIS — N182 Chronic kidney disease, stage 2 (mild): Secondary | ICD-10-CM | POA: Diagnosis not present

## 2018-06-04 DIAGNOSIS — Z125 Encounter for screening for malignant neoplasm of prostate: Secondary | ICD-10-CM | POA: Diagnosis not present

## 2018-06-04 DIAGNOSIS — Z1389 Encounter for screening for other disorder: Secondary | ICD-10-CM | POA: Diagnosis not present

## 2018-06-04 DIAGNOSIS — E782 Mixed hyperlipidemia: Secondary | ICD-10-CM | POA: Diagnosis not present

## 2018-06-04 DIAGNOSIS — I10 Essential (primary) hypertension: Secondary | ICD-10-CM | POA: Diagnosis not present

## 2018-06-04 DIAGNOSIS — M169 Osteoarthritis of hip, unspecified: Secondary | ICD-10-CM | POA: Diagnosis not present

## 2018-09-11 DIAGNOSIS — I10 Essential (primary) hypertension: Secondary | ICD-10-CM | POA: Diagnosis not present

## 2018-09-11 DIAGNOSIS — N182 Chronic kidney disease, stage 2 (mild): Secondary | ICD-10-CM | POA: Diagnosis not present

## 2018-09-11 DIAGNOSIS — Z6834 Body mass index (BMI) 34.0-34.9, adult: Secondary | ICD-10-CM | POA: Diagnosis not present

## 2018-09-11 DIAGNOSIS — E782 Mixed hyperlipidemia: Secondary | ICD-10-CM | POA: Diagnosis not present

## 2018-09-11 DIAGNOSIS — M169 Osteoarthritis of hip, unspecified: Secondary | ICD-10-CM | POA: Diagnosis not present

## 2018-09-11 DIAGNOSIS — I82413 Acute embolism and thrombosis of femoral vein, bilateral: Secondary | ICD-10-CM | POA: Diagnosis not present

## 2018-12-24 DIAGNOSIS — E782 Mixed hyperlipidemia: Secondary | ICD-10-CM | POA: Diagnosis not present

## 2018-12-24 DIAGNOSIS — M169 Osteoarthritis of hip, unspecified: Secondary | ICD-10-CM | POA: Diagnosis not present

## 2018-12-24 DIAGNOSIS — Z6833 Body mass index (BMI) 33.0-33.9, adult: Secondary | ICD-10-CM | POA: Diagnosis not present

## 2018-12-24 DIAGNOSIS — N182 Chronic kidney disease, stage 2 (mild): Secondary | ICD-10-CM | POA: Diagnosis not present

## 2018-12-24 DIAGNOSIS — I82502 Chronic embolism and thrombosis of unspecified deep veins of left lower extremity: Secondary | ICD-10-CM | POA: Diagnosis not present

## 2018-12-24 DIAGNOSIS — I10 Essential (primary) hypertension: Secondary | ICD-10-CM | POA: Diagnosis not present

## 2019-01-01 DIAGNOSIS — Z20828 Contact with and (suspected) exposure to other viral communicable diseases: Secondary | ICD-10-CM | POA: Diagnosis not present

## 2019-01-01 DIAGNOSIS — Z79899 Other long term (current) drug therapy: Secondary | ICD-10-CM | POA: Diagnosis not present

## 2019-01-01 DIAGNOSIS — Z86718 Personal history of other venous thrombosis and embolism: Secondary | ICD-10-CM | POA: Diagnosis not present

## 2019-01-01 DIAGNOSIS — I2510 Atherosclerotic heart disease of native coronary artery without angina pectoris: Secondary | ICD-10-CM | POA: Diagnosis not present

## 2019-01-01 DIAGNOSIS — E785 Hyperlipidemia, unspecified: Secondary | ICD-10-CM | POA: Diagnosis not present

## 2019-01-01 DIAGNOSIS — M1990 Unspecified osteoarthritis, unspecified site: Secondary | ICD-10-CM | POA: Diagnosis not present

## 2019-01-01 DIAGNOSIS — N182 Chronic kidney disease, stage 2 (mild): Secondary | ICD-10-CM | POA: Diagnosis not present

## 2019-01-01 DIAGNOSIS — J180 Bronchopneumonia, unspecified organism: Secondary | ICD-10-CM | POA: Diagnosis not present

## 2019-01-01 DIAGNOSIS — J189 Pneumonia, unspecified organism: Secondary | ICD-10-CM | POA: Diagnosis not present

## 2019-01-01 DIAGNOSIS — I129 Hypertensive chronic kidney disease with stage 1 through stage 4 chronic kidney disease, or unspecified chronic kidney disease: Secondary | ICD-10-CM | POA: Diagnosis not present

## 2019-01-02 DIAGNOSIS — Z79899 Other long term (current) drug therapy: Secondary | ICD-10-CM | POA: Diagnosis not present

## 2019-01-02 DIAGNOSIS — N182 Chronic kidney disease, stage 2 (mild): Secondary | ICD-10-CM | POA: Diagnosis not present

## 2019-01-02 DIAGNOSIS — M1990 Unspecified osteoarthritis, unspecified site: Secondary | ICD-10-CM | POA: Diagnosis not present

## 2019-01-02 DIAGNOSIS — Z86718 Personal history of other venous thrombosis and embolism: Secondary | ICD-10-CM | POA: Diagnosis not present

## 2019-01-02 DIAGNOSIS — I2510 Atherosclerotic heart disease of native coronary artery without angina pectoris: Secondary | ICD-10-CM | POA: Diagnosis not present

## 2019-01-02 DIAGNOSIS — I129 Hypertensive chronic kidney disease with stage 1 through stage 4 chronic kidney disease, or unspecified chronic kidney disease: Secondary | ICD-10-CM | POA: Diagnosis not present

## 2019-01-02 DIAGNOSIS — E785 Hyperlipidemia, unspecified: Secondary | ICD-10-CM | POA: Diagnosis not present

## 2019-01-02 DIAGNOSIS — Z20828 Contact with and (suspected) exposure to other viral communicable diseases: Secondary | ICD-10-CM | POA: Diagnosis not present

## 2019-01-02 DIAGNOSIS — J189 Pneumonia, unspecified organism: Secondary | ICD-10-CM | POA: Diagnosis not present

## 2019-01-03 DIAGNOSIS — Z20828 Contact with and (suspected) exposure to other viral communicable diseases: Secondary | ICD-10-CM | POA: Diagnosis not present

## 2019-01-03 DIAGNOSIS — Z79899 Other long term (current) drug therapy: Secondary | ICD-10-CM | POA: Diagnosis not present

## 2019-01-03 DIAGNOSIS — N182 Chronic kidney disease, stage 2 (mild): Secondary | ICD-10-CM | POA: Diagnosis not present

## 2019-01-03 DIAGNOSIS — E785 Hyperlipidemia, unspecified: Secondary | ICD-10-CM | POA: Diagnosis not present

## 2019-01-03 DIAGNOSIS — M1990 Unspecified osteoarthritis, unspecified site: Secondary | ICD-10-CM | POA: Diagnosis not present

## 2019-01-03 DIAGNOSIS — I2510 Atherosclerotic heart disease of native coronary artery without angina pectoris: Secondary | ICD-10-CM | POA: Diagnosis not present

## 2019-01-03 DIAGNOSIS — J189 Pneumonia, unspecified organism: Secondary | ICD-10-CM | POA: Diagnosis not present

## 2019-01-03 DIAGNOSIS — Z86718 Personal history of other venous thrombosis and embolism: Secondary | ICD-10-CM | POA: Diagnosis not present

## 2019-01-03 DIAGNOSIS — I129 Hypertensive chronic kidney disease with stage 1 through stage 4 chronic kidney disease, or unspecified chronic kidney disease: Secondary | ICD-10-CM | POA: Diagnosis not present

## 2019-01-14 DIAGNOSIS — Z6833 Body mass index (BMI) 33.0-33.9, adult: Secondary | ICD-10-CM | POA: Diagnosis not present

## 2019-01-14 DIAGNOSIS — J168 Pneumonia due to other specified infectious organisms: Secondary | ICD-10-CM | POA: Diagnosis not present

## 2019-02-19 DIAGNOSIS — N182 Chronic kidney disease, stage 2 (mild): Secondary | ICD-10-CM | POA: Diagnosis not present

## 2019-02-19 DIAGNOSIS — I5033 Acute on chronic diastolic (congestive) heart failure: Secondary | ICD-10-CM | POA: Diagnosis not present

## 2019-02-19 DIAGNOSIS — Z6833 Body mass index (BMI) 33.0-33.9, adult: Secondary | ICD-10-CM | POA: Diagnosis not present

## 2019-02-19 DIAGNOSIS — M1009 Idiopathic gout, multiple sites: Secondary | ICD-10-CM | POA: Diagnosis not present

## 2019-02-19 DIAGNOSIS — I825Z2 Chronic embolism and thrombosis of unspecified deep veins of left distal lower extremity: Secondary | ICD-10-CM | POA: Diagnosis not present

## 2019-02-19 DIAGNOSIS — R7303 Prediabetes: Secondary | ICD-10-CM | POA: Diagnosis not present

## 2019-03-06 DIAGNOSIS — Z6832 Body mass index (BMI) 32.0-32.9, adult: Secondary | ICD-10-CM | POA: Diagnosis not present

## 2019-03-06 DIAGNOSIS — N62 Hypertrophy of breast: Secondary | ICD-10-CM | POA: Diagnosis not present

## 2019-03-20 DIAGNOSIS — N182 Chronic kidney disease, stage 2 (mild): Secondary | ICD-10-CM | POA: Diagnosis not present

## 2019-03-20 DIAGNOSIS — I825Z2 Chronic embolism and thrombosis of unspecified deep veins of left distal lower extremity: Secondary | ICD-10-CM | POA: Diagnosis not present

## 2019-03-20 DIAGNOSIS — E7849 Other hyperlipidemia: Secondary | ICD-10-CM | POA: Diagnosis not present

## 2019-03-20 DIAGNOSIS — I10 Essential (primary) hypertension: Secondary | ICD-10-CM | POA: Diagnosis not present

## 2019-03-20 DIAGNOSIS — Z0000 Encounter for general adult medical examination without abnormal findings: Secondary | ICD-10-CM | POA: Diagnosis not present

## 2019-03-20 DIAGNOSIS — Z1389 Encounter for screening for other disorder: Secondary | ICD-10-CM | POA: Diagnosis not present

## 2019-03-20 DIAGNOSIS — Z6833 Body mass index (BMI) 33.0-33.9, adult: Secondary | ICD-10-CM | POA: Diagnosis not present

## 2019-03-20 DIAGNOSIS — I5032 Chronic diastolic (congestive) heart failure: Secondary | ICD-10-CM | POA: Diagnosis not present

## 2019-03-27 DIAGNOSIS — R928 Other abnormal and inconclusive findings on diagnostic imaging of breast: Secondary | ICD-10-CM | POA: Diagnosis not present

## 2019-03-27 DIAGNOSIS — N62 Hypertrophy of breast: Secondary | ICD-10-CM | POA: Diagnosis not present

## 2019-05-01 DIAGNOSIS — Z6833 Body mass index (BMI) 33.0-33.9, adult: Secondary | ICD-10-CM | POA: Diagnosis not present

## 2019-05-01 DIAGNOSIS — N3001 Acute cystitis with hematuria: Secondary | ICD-10-CM | POA: Diagnosis not present

## 2019-06-06 ENCOUNTER — Inpatient Hospital Stay: Admit: 2019-06-06 | Discharge: 2019-06-06 | Payer: PRIVATE HEALTH INSURANCE | Primary: Family Medicine

## 2019-06-06 LAB — COMPREHENSIVE METABOLIC PANEL
BKR ALANINE AMINOTRANSFERASE (ALT): 32 U/L (ref 16–61)
BKR ALBUMIN: 3.6 g/dL (ref 3.4–5.0)
BKR ALKALINE PHOSPHATASE: 73 U/L (ref 45–117)
BKR ANION GAP (LM): 7 mmol/L (ref 5–15)
BKR ASPARTATE AMINOTRANSFERASE (AST): 21 U/L (ref 15–37)
BKR BILIRUBIN TOTAL: 0.8 mg/dL (ref <1.0)
BKR BLOOD UREA NITROGEN: 18 mg/dL (ref 7–18)
BKR CALCIUM: 8.9 mg/dL (ref 8.5–10.1)
BKR CHLORIDE: 108 mmol/L — ABNORMAL HIGH (ref 98–107)
BKR CO2: 25 mmol/L (ref 21–32)
BKR CREATININE: 0.82 mg/dL (ref 0.70–1.30)
BKR EGFR (AFR AMER) (LMC): >60 mL/min/{1.73_m2} (ref >60)
BKR EGFR (NON AFR AMER) (LMC): >60 mL/min/{1.73_m2} (ref >60)
BKR GLOBULIN: 3.3 g/dL (ref 2.5–5.0)
BKR GLUCOSE: 81 mg/dL (ref 65–110)
BKR POTASSIUM: 4.1 mmol/L (ref 3.5–5.1)
BKR PROTEIN TOTAL: 6.9 g/dL (ref 6.4–8.2)
BKR SODIUM: 140 mmol/L (ref 136–145)

## 2019-06-06 LAB — CBC WITH AUTO DIFFERENTIAL
BKR WAM ABSOLUTE IMMATURE GRANULOCYTES.: 0.01 x 1000/ÂµL (ref 0.00–0.10)
BKR WAM ABSOLUTE LYMPHOCYTE COUNT.: 0.86 x 1000/ÂµL — ABNORMAL LOW (ref 1.00–4.50)
BKR WAM ABSOLUTE NEUTROPHIL COUNT.: 3.85 x 1000/ÂµL (ref 1.40–6.60)
BKR WAM BASOPHIL ABSOLUTE COUNT.: 0.06 x 1000/ÂµL (ref 0.0–0.3)
BKR WAM BASOPHILS: 1.1 % (ref 0.0–3.0)
BKR WAM EOSINOPHIL ABSOLUTE COUNT.: 0.15 x 1000/ÂµL (ref 0.00–0.45)
BKR WAM EOSINOPHILS: 2.8 % (ref 0.0–4.0)
BKR WAM HEMATOCRIT: 52.5 % — ABNORMAL HIGH (ref 40.0–50.0)
BKR WAM HEMOGLOBIN: 17.2 g/dL — ABNORMAL HIGH (ref 13.0–17.0)
BKR WAM IMMATURE GRANULOCYTES: 0.2 % (ref 0.0–1.0)
BKR WAM LYMPHOCYTES: 15.8 % — ABNORMAL LOW (ref 25.0–45.0)
BKR WAM MCH PG: 31.6 pg (ref 27–33)
BKR WAM MCHC: 32.8 g/dL (ref 32.0–36.0)
BKR WAM MCV: 96.3 fL (ref 79.0–99.0)
BKR WAM MONOCYTE ABSOLUTE COUNT.: 0.51 x 1000/ÂµL (ref 0.00–1.20)
BKR WAM MONOCYTES: 9.4 % (ref 0.0–12.0)
BKR WAM MPV: 10.7 fL (ref 7.5–11.5)
BKR WAM NEUTROPHILS: 70.7 % — ABNORMAL HIGH (ref 36.0–66.0)
BKR WAM NUCLEATED RED BLOOD CELLS: 0 % (ref 0.0–5.0)
BKR WAM PLATELETS: 148 x1000/ÂµL (ref 140–400)
BKR WAM RDW-CV: 12.9 % (ref 11.5–14.5)
BKR WAM RED BLOOD CELL COUNT.: 5.45 M/ÂµL — ABNORMAL HIGH (ref 4.00–5.20)
BKR WAM WHITE BLOOD CELL COUNT.: 5.44 x1000/ÂµL (ref 4.00–10.00)

## 2019-06-06 LAB — PSA, TOTAL (SCREENING) (BH GH L LMW YH): BKR PROSTATE SPECIFIC ANTIGEN, SCREENING: 0.7 ng/mL (ref <4.000)

## 2019-06-06 LAB — LIPID PANEL
BKR CHOLESTEROL: 146 mg/dL
BKR HDL CHOLESTEROL: 47 mg/dL
BKR LDL CHOLESTEROL CALCULATED: 78 mg/dL
BKR TRIGLYCERIDES: 106 mg/dL

## 2019-06-06 LAB — TSH W/REFLEX TO FT4     (BH GH LMW Q YH): BKR THYROID STIMULATING HORMONE (LM): 0.65 u[IU]/mL (ref 0.36–3.74)

## 2019-06-06 MED ORDER — LORATADINE 10 MG TABLET
10 | ORAL | 0.00 refills | 75.00000 days | Status: AC
Start: 2019-06-06 — End: ?

## 2019-06-06 NOTE — Other
All good

## 2019-06-07 NOTE — Other
Good morning, called patient and relayed message and patient was pleased. Thank you.

## 2019-06-14 MED ORDER — SILDENAFIL 100 MG TABLET
100 | ORAL_TABLET | Freq: Every day | ORAL | 4 refills | 30.00000 days | Status: AC | PRN
Start: 2019-06-14 — End: 2020-07-10

## 2019-07-02 DIAGNOSIS — M1009 Idiopathic gout, multiple sites: Secondary | ICD-10-CM | POA: Diagnosis not present

## 2019-07-02 DIAGNOSIS — N182 Chronic kidney disease, stage 2 (mild): Secondary | ICD-10-CM | POA: Diagnosis not present

## 2019-07-02 DIAGNOSIS — E7849 Other hyperlipidemia: Secondary | ICD-10-CM | POA: Diagnosis not present

## 2019-07-02 DIAGNOSIS — Z6833 Body mass index (BMI) 33.0-33.9, adult: Secondary | ICD-10-CM | POA: Diagnosis not present

## 2019-07-02 DIAGNOSIS — R7303 Prediabetes: Secondary | ICD-10-CM | POA: Diagnosis not present

## 2019-07-02 DIAGNOSIS — I5032 Chronic diastolic (congestive) heart failure: Secondary | ICD-10-CM | POA: Diagnosis not present

## 2019-07-02 DIAGNOSIS — I10 Essential (primary) hypertension: Secondary | ICD-10-CM | POA: Diagnosis not present

## 2019-10-07 DIAGNOSIS — M1009 Idiopathic gout, multiple sites: Secondary | ICD-10-CM | POA: Diagnosis not present

## 2019-10-07 DIAGNOSIS — I10 Essential (primary) hypertension: Secondary | ICD-10-CM | POA: Diagnosis not present

## 2019-10-07 DIAGNOSIS — I5032 Chronic diastolic (congestive) heart failure: Secondary | ICD-10-CM | POA: Diagnosis not present

## 2019-10-07 DIAGNOSIS — N182 Chronic kidney disease, stage 2 (mild): Secondary | ICD-10-CM | POA: Diagnosis not present

## 2019-10-07 DIAGNOSIS — E7849 Other hyperlipidemia: Secondary | ICD-10-CM | POA: Diagnosis not present

## 2019-10-07 DIAGNOSIS — Z0000 Encounter for general adult medical examination without abnormal findings: Secondary | ICD-10-CM | POA: Diagnosis not present

## 2019-10-07 DIAGNOSIS — Z6833 Body mass index (BMI) 33.0-33.9, adult: Secondary | ICD-10-CM | POA: Diagnosis not present

## 2019-11-29 MED ORDER — DOXYCYCLINE HYCLATE 100 MG CAPSULE
100 | ORAL_CAPSULE | Freq: Two times a day (BID) | ORAL | 1 refills | 7.00000 days | Status: AC
Start: 2019-11-29 — End: 2020-06-11

## 2020-01-13 DIAGNOSIS — M7552 Bursitis of left shoulder: Secondary | ICD-10-CM | POA: Diagnosis not present

## 2020-01-13 DIAGNOSIS — Z6832 Body mass index (BMI) 32.0-32.9, adult: Secondary | ICD-10-CM | POA: Diagnosis not present

## 2020-01-13 DIAGNOSIS — Z0000 Encounter for general adult medical examination without abnormal findings: Secondary | ICD-10-CM | POA: Diagnosis not present

## 2020-01-13 DIAGNOSIS — M1009 Idiopathic gout, multiple sites: Secondary | ICD-10-CM | POA: Diagnosis not present

## 2020-01-13 DIAGNOSIS — N182 Chronic kidney disease, stage 2 (mild): Secondary | ICD-10-CM | POA: Diagnosis not present

## 2020-01-13 DIAGNOSIS — E7849 Other hyperlipidemia: Secondary | ICD-10-CM | POA: Diagnosis not present

## 2020-01-13 DIAGNOSIS — Z125 Encounter for screening for malignant neoplasm of prostate: Secondary | ICD-10-CM | POA: Diagnosis not present

## 2020-01-13 DIAGNOSIS — I10 Essential (primary) hypertension: Secondary | ICD-10-CM | POA: Diagnosis not present

## 2020-01-13 DIAGNOSIS — I5032 Chronic diastolic (congestive) heart failure: Secondary | ICD-10-CM | POA: Diagnosis not present

## 2020-02-17 DIAGNOSIS — R109 Unspecified abdominal pain: Secondary | ICD-10-CM | POA: Diagnosis not present

## 2020-02-17 DIAGNOSIS — Z6832 Body mass index (BMI) 32.0-32.9, adult: Secondary | ICD-10-CM | POA: Diagnosis not present

## 2020-02-19 DIAGNOSIS — S3991XA Unspecified injury of abdomen, initial encounter: Secondary | ICD-10-CM | POA: Diagnosis not present

## 2020-02-19 DIAGNOSIS — R109 Unspecified abdominal pain: Secondary | ICD-10-CM | POA: Diagnosis not present

## 2020-02-19 DIAGNOSIS — N400 Enlarged prostate without lower urinary tract symptoms: Secondary | ICD-10-CM | POA: Diagnosis not present

## 2020-02-19 DIAGNOSIS — K7460 Unspecified cirrhosis of liver: Secondary | ICD-10-CM | POA: Diagnosis not present

## 2020-02-19 DIAGNOSIS — N200 Calculus of kidney: Secondary | ICD-10-CM | POA: Diagnosis not present

## 2020-03-11 ENCOUNTER — Ambulatory Visit: Payer: No Typology Code available for payment source

## 2020-03-11 ENCOUNTER — Ambulatory Visit (INDEPENDENT_AMBULATORY_CARE_PROVIDER_SITE_OTHER): Payer: No Typology Code available for payment source

## 2020-03-11 ENCOUNTER — Ambulatory Visit
Admission: EM | Admit: 2020-03-11 | Discharge: 2020-03-11 | Disposition: A | Discharge status: Home / Self Care | Payer: No Typology Code available for payment source | Attending: Internal Medicine | Admitting: Internal Medicine

## 2020-03-11 DIAGNOSIS — S96912A Strain of unspecified muscle and tendon at ankle and foot level, left foot, initial encounter: Secondary | ICD-10-CM | POA: Diagnosis not present

## 2020-03-11 DIAGNOSIS — G8929 Other chronic pain: Secondary | ICD-10-CM | POA: Diagnosis not present

## 2020-03-11 DIAGNOSIS — M25561 Pain in right knee: Secondary | ICD-10-CM

## 2020-03-11 DIAGNOSIS — M25562 Pain in left knee: Secondary | ICD-10-CM | POA: Diagnosis not present

## 2020-03-11 DIAGNOSIS — M25572 Pain in left ankle and joints of left foot: Secondary | ICD-10-CM | POA: Diagnosis not present

## 2020-03-11 DIAGNOSIS — M79672 Pain in left foot: Secondary | ICD-10-CM | POA: Diagnosis not present

## 2020-03-11 NOTE — Discharge Instructions (Addendum)
I reviewed all the xrays myself and I do not see any brakes or bone chips. [facility]. You may need physical therapy for your left ankle and foot pain since more likely you have injured your ligaments and tendons and those take time to heal, but physical therapy can speed up the process.   If the final radiology reading finds something I have not seen today, I will call you

## 2020-03-11 NOTE — ED Provider Notes (Signed)
[facility]    CSN: [id number] Arrival date & time: 03/11/20  1038      History   Chief Complaint Chief Complaint  Patient presents with  . Fall    HPI Mike Moreno is a 74 y.o. male who presents with bilateral knee pain and L foot pain since he fell from tripping on a bold on the ground on 02/11/2020. He went to ER and his ribs were the most painful then and had CT scan, but since his knees were not as painful, he did not have those xrayed. Has hx of knee bilateral OA and receives steroid injections in them, the R one hurts more than the L. His orthopedist at the [place] told him to come in for xrays to make sure he does not have a chip, so he would not have to travel to [place]. He also states his L foot hurts on the lateral foot and ankle area.     Past Medical History:  Diagnosis Date  . BPH (benign prostatic hyperplasia)   . HTN (hypertension)   . Hyperlipidemia     Patient Active Problem List   Diagnosis Date Noted  . Hx of adenomatous polyp of colon 11/10/2016    Past Surgical History:  Procedure Laterality Date  . BIOPSY  12/07/2016   Procedure: BIOPSY;  Surgeon: Daneil Dolin, MD;  [facility];  Service: Endoscopy;;  ileocecal valve  . COLONOSCOPY  08/2011   According to [facility] notes, 2 tubular adenomatous colon polyps removed next surveillance colonoscopy in 5 years  . COLONOSCOPY N/A 12/07/2016   Procedure: COLONOSCOPY;  Surgeon: Daneil Dolin, MD;  [facility];  Service: Endoscopy;  Laterality: N/A;  1030   . HAND SURGERY     traumatic injury  . POLYPECTOMY  12/07/2016   Procedure: POLYPECTOMY;  Surgeon: Daneil Dolin, MD;  [facility];  Service: Endoscopy;;  colon       Home Medications    Prior to Admission medications   Medication Sig Start Date End Date Taking? Authorizing Provider  atorvastatin (LIPITOR) 40 MG tablet Take 40 mg by mouth every evening.    [provider]  benazepril (LOTENSIN)  5 MG tablet Take 5 mg by mouth daily.    [provider]  finasteride (PROSCAR) 5 MG tablet Take 5 mg by mouth daily.    [provider]  polyethylene glycol-electrolytes (TRILYTE) 420 g solution Take 4,000 mLs by mouth as directed. 11/10/16   Rourk, Cristopher Estimable, MD  tiZANidine (ZANAFLEX) 4 MG tablet Take 4 mg by mouth at bedtime. 11/01/16   [provider]  Vitamin D, Ergocalciferol, (DRISDOL) 50000 units CAPS capsule Take 50,000 Units by mouth every Wednesday.    [provider]    Family History Family History  Problem Relation Age of Onset  . Colon cancer Neg Hx     Social History Social History   Tobacco Use  . Smoking status: Former Smoker    Types: Cigarettes, Cigars  . Smokeless tobacco: Never Used  . Tobacco comment: quit 2015  Vaping Use  . Vaping Use: Never used  Substance Use Topics  . Alcohol use: No    Comment: some etoh 20 years ago  . Drug use: No    Comment: not since 2005     Allergies   Patient has no known allergies.   Review of Systems Review of Systems  Cardiovascular: Negative for leg swelling.  Musculoskeletal: Positive  for arthralgias and gait problem. Negative for back pain and joint swelling.  Skin: Negative for rash and wound.  Neurological: Positive for numbness. Negative for dizziness.       Off and on on ankle pain area     Physical Exam Triage Vital Signs ED Triage Vitals  Enc Vitals Group     BP 03/11/20 1050 (!) 154/80     Pulse Rate 03/11/20 1050 74     Resp 03/11/20 1050 18     Temp 03/11/20 1050 97.8 F (36.6 C)     Temp Source 03/11/20 1050 Oral     SpO2 03/11/20 1050 100 %     Weight --      Height --      Head Circumference --      Peak Flow --      Pain Score 03/11/20 1051 6     Pain Loc --      Pain Edu? --      Excl. in [place]? --    No data found.  Updated Vital Signs BP (!) 154/80 (BP Location: Right Arm)   Pulse 74   Temp 97.8 F (36.6 C) (Oral)   Resp 18   SpO2 100%    Visual Acuity Right Eye Distance:   Left Eye Distance:   Bilateral Distance:    Right Eye Near:   Left Eye Near:    Bilateral Near:     Physical Exam Vitals and nursing note reviewed.  Constitutional:      General: He is not in acute distress.    Appearance: He is not toxic-appearing.  HENT:     Right Ear: External ear normal.     Left Ear: External ear normal.  Eyes:     General: No scleral icterus.    Conjunctiva/sclera: Conjunctivae normal.  Pulmonary:     Effort: Pulmonary effort is normal.  Musculoskeletal:        General: No swelling, deformity or signs of injury.     Cervical back: Neck supple.     Right lower leg: No edema.     Left lower leg: No edema.     Comments: KNEES- upon inspection, there is no redness, effusion or redness, both with normal ROM and mild pain on L knee joint region and moderate pain on R joint line region. There are a lot of crepitations in the R knee. No laxity noted bilaterally.   Skin:    General: Skin is warm and dry.     Findings: No rash.  Neurological:     Mental Status: He is alert and oriented to person, place, and time.     Gait: Gait normal.     Deep Tendon Reflexes: Reflexes normal.  Psychiatric:        Mood and Affect: Mood normal.        Behavior: Behavior normal.        Thought Content: Thought content normal.        Judgment: Judgment normal.    UC Treatments / Results  Labs (all labs ordered are listed, but only abnormal results are displayed) Labs Reviewed - No data to display  EKG   Radiology No results found.  Procedures Procedures (including critical care time)  Medications Ordered in UC Medications - No data to display  Initial Impression / Assessment and Plan / UC Course  I have reviewed the triage vital signs and the nursing notes. Chronic bilateral knee pain with aggravation of the R due  to fall, but negative xrays. Strained L ankle and foot. Needs to FU with his [place] orthopedist.  Pertinent   imaging results that were available during my care of the patient were reviewed by me and considered in my medical decision making (see chart for details).  Final Clinical Impressions(s) / UC Diagnoses   Final diagnoses:  None   Discharge Instructions   None    ED Prescriptions    None     PDMP not reviewed this encounter.   Shelby Mattocks, PA-C 03/11/20 1316

## 2020-03-11 NOTE — ED Triage Notes (Signed)
Pt present fall a month ago 02/11/2020. Pt states having pain in both knees and left foot. Pt states that he has no cartilage in both knees. He would like to get an X ray to make sure he has no chip bones because of the severity of pain.

## 2020-04-06 MED ORDER — SIMVASTATIN 10 MG TABLET
10 | ORAL_TABLET | ORAL | 4 refills | 90.00000 days | Status: AC
Start: 2020-04-06 — End: 2021-04-08

## 2020-04-13 DIAGNOSIS — Z6833 Body mass index (BMI) 33.0-33.9, adult: Secondary | ICD-10-CM | POA: Diagnosis not present

## 2020-04-13 DIAGNOSIS — N400 Enlarged prostate without lower urinary tract symptoms: Secondary | ICD-10-CM | POA: Diagnosis not present

## 2020-04-13 DIAGNOSIS — Z0000 Encounter for general adult medical examination without abnormal findings: Secondary | ICD-10-CM | POA: Diagnosis not present

## 2020-04-13 DIAGNOSIS — I10 Essential (primary) hypertension: Secondary | ICD-10-CM | POA: Diagnosis not present

## 2020-04-13 DIAGNOSIS — M10072 Idiopathic gout, left ankle and foot: Secondary | ICD-10-CM | POA: Diagnosis not present

## 2020-04-13 DIAGNOSIS — I700 Atherosclerosis of aorta: Secondary | ICD-10-CM | POA: Diagnosis not present

## 2020-04-13 DIAGNOSIS — J449 Chronic obstructive pulmonary disease, unspecified: Secondary | ICD-10-CM | POA: Diagnosis not present

## 2020-04-13 DIAGNOSIS — N182 Chronic kidney disease, stage 2 (mild): Secondary | ICD-10-CM | POA: Diagnosis not present

## 2020-05-11 DIAGNOSIS — N1831 Chronic kidney disease, stage 3a: Secondary | ICD-10-CM | POA: Diagnosis not present

## 2020-05-19 ENCOUNTER — Ambulatory Visit: Admit: 2020-05-19 | Payer: PRIVATE HEALTH INSURANCE | Primary: Family Medicine

## 2020-05-23 DIAGNOSIS — I129 Hypertensive chronic kidney disease with stage 1 through stage 4 chronic kidney disease, or unspecified chronic kidney disease: Secondary | ICD-10-CM | POA: Diagnosis not present

## 2020-05-23 DIAGNOSIS — J449 Chronic obstructive pulmonary disease, unspecified: Secondary | ICD-10-CM | POA: Diagnosis not present

## 2020-05-23 DIAGNOSIS — I700 Atherosclerosis of aorta: Secondary | ICD-10-CM | POA: Diagnosis not present

## 2020-05-23 DIAGNOSIS — N182 Chronic kidney disease, stage 2 (mild): Secondary | ICD-10-CM | POA: Diagnosis not present

## 2020-06-01 MED ORDER — FLUTICASONE PROPIONATE 50 MCG/ACTUATION NASAL SPRAY,SUSPENSION
50 | NASAL | 3 refills | 30.00000 days | Status: AC
Start: 2020-06-01 — End: ?

## 2020-06-03 DIAGNOSIS — J208 Acute bronchitis due to other specified organisms: Secondary | ICD-10-CM | POA: Diagnosis not present

## 2020-06-03 DIAGNOSIS — Z6832 Body mass index (BMI) 32.0-32.9, adult: Secondary | ICD-10-CM | POA: Diagnosis not present

## 2020-06-11 ENCOUNTER — Ambulatory Visit: Admit: 2020-06-11 | Payer: PRIVATE HEALTH INSURANCE | Attending: Family Medicine | Primary: Family Medicine

## 2020-06-11 DIAGNOSIS — E7800 Pure hypercholesterolemia, unspecified: Secondary | ICD-10-CM

## 2020-06-11 DIAGNOSIS — I6523 Occlusion and stenosis of bilateral carotid arteries: Secondary | ICD-10-CM

## 2020-06-11 DIAGNOSIS — Z87442 Personal history of urinary calculi: Secondary | ICD-10-CM

## 2020-06-11 DIAGNOSIS — Z125 Encounter for screening for malignant neoplasm of prostate: Secondary | ICD-10-CM

## 2020-06-11 DIAGNOSIS — Z0000 Encounter for general adult medical examination without abnormal findings: Secondary | ICD-10-CM

## 2020-06-22 DIAGNOSIS — N182 Chronic kidney disease, stage 2 (mild): Secondary | ICD-10-CM | POA: Diagnosis not present

## 2020-06-22 DIAGNOSIS — G588 Other specified mononeuropathies: Secondary | ICD-10-CM | POA: Diagnosis not present

## 2020-06-22 DIAGNOSIS — E7849 Other hyperlipidemia: Secondary | ICD-10-CM | POA: Diagnosis not present

## 2020-06-22 DIAGNOSIS — M810 Age-related osteoporosis without current pathological fracture: Secondary | ICD-10-CM | POA: Diagnosis not present

## 2020-06-22 DIAGNOSIS — M10072 Idiopathic gout, left ankle and foot: Secondary | ICD-10-CM | POA: Diagnosis not present

## 2020-06-22 DIAGNOSIS — M85851 Other specified disorders of bone density and structure, right thigh: Secondary | ICD-10-CM | POA: Diagnosis not present

## 2020-07-10 MED ORDER — SILDENAFIL 100 MG TABLET
100 | ORAL_TABLET | ORAL | 4 refills | 30.00000 days | Status: AC
Start: 2020-07-10 — End: 2021-08-02

## 2020-07-13 DIAGNOSIS — Z6833 Body mass index (BMI) 33.0-33.9, adult: Secondary | ICD-10-CM | POA: Diagnosis not present

## 2020-07-13 DIAGNOSIS — M10072 Idiopathic gout, left ankle and foot: Secondary | ICD-10-CM | POA: Diagnosis not present

## 2020-07-13 DIAGNOSIS — N400 Enlarged prostate without lower urinary tract symptoms: Secondary | ICD-10-CM | POA: Diagnosis not present

## 2020-07-13 DIAGNOSIS — E7849 Other hyperlipidemia: Secondary | ICD-10-CM | POA: Diagnosis not present

## 2020-07-13 DIAGNOSIS — N182 Chronic kidney disease, stage 2 (mild): Secondary | ICD-10-CM | POA: Diagnosis not present

## 2020-07-13 DIAGNOSIS — G588 Other specified mononeuropathies: Secondary | ICD-10-CM | POA: Diagnosis not present

## 2020-07-13 DIAGNOSIS — J208 Acute bronchitis due to other specified organisms: Secondary | ICD-10-CM | POA: Diagnosis not present

## 2020-07-30 ENCOUNTER — Other Ambulatory Visit: Payer: Self-pay

## 2020-07-30 ENCOUNTER — Encounter: Payer: Self-pay | Admitting: Emergency Medicine

## 2020-07-30 ENCOUNTER — Ambulatory Visit
Admission: EM | Admit: 2020-07-30 | Discharge: 2020-07-30 | Disposition: A | Discharge status: Home / Self Care | Payer: Medicare HMO | Attending: Emergency Medicine | Admitting: Emergency Medicine

## 2020-07-30 DIAGNOSIS — G6289 Other specified polyneuropathies: Secondary | ICD-10-CM | POA: Diagnosis not present

## 2020-07-30 DIAGNOSIS — M7989 Other specified soft tissue disorders: Secondary | ICD-10-CM

## 2020-07-30 MED ORDER — 1ST MEDX-PATCH/ LIDOCAINE 4-0.0375-5-20 % EX PTCH: MEDICATED_PATCH | CUTANEOUS | 1 refills | Status: DC

## 2020-07-30 NOTE — ED Provider Notes (Signed)
[place]   [id number] 07/30/20 Arrival Time: 5397  CC: LE swelling and burning sensation  SUBJECTIVE: History from: patient. Mike Moreno is a 75 y.o. male complains of bilateral feet swelling and burning x 6 months.  Denies a precipitating event or specific injury.  Denies hx of DM.  Localizes the pain to the bilateral LEs.  Describes the pain as constant and burning in character.  Has tried gabapentin without relief.  Symptoms are made worse with sitting.  Denies similar symptoms in the past.  Denies fever, chills, erythema, ecchymosis, weakness.  Unable to be seen by PCP.    ROS: As per HPI.  All other pertinent ROS negative.     Past Medical History:  Diagnosis Date  . BPH (benign prostatic hyperplasia)   . HTN (hypertension)   . Hyperlipidemia    Past Surgical History:  Procedure Laterality Date  . BIOPSY  12/07/2016   Procedure: BIOPSY;  Surgeon: Daneil Dolin, MD;  [facility];  Service: Endoscopy;;  ileocecal valve  . COLONOSCOPY  08/2011   According to [facility] notes, 2 tubular adenomatous colon polyps removed next surveillance colonoscopy in 5 years  . COLONOSCOPY N/A 12/07/2016   Procedure: COLONOSCOPY;  Surgeon: Daneil Dolin, MD;  [facility];  Service: Endoscopy;  Laterality: N/A;  1030   . HAND SURGERY     traumatic injury  . POLYPECTOMY  12/07/2016   Procedure: POLYPECTOMY;  Surgeon: Daneil Dolin, MD;  [facility];  Service: Endoscopy;;  colon   No Known Allergies No current facility-administered medications on file prior to encounter.   Current Outpatient Medications on File Prior to Encounter  Medication Sig Dispense Refill  . atorvastatin (LIPITOR) 40 MG tablet Take 40 mg by mouth every evening.    . benazepril (LOTENSIN) 5 MG tablet Take 5 mg by mouth daily.    . finasteride (PROSCAR) 5 MG tablet Take 5 mg by mouth daily.    Marland Kitchen tiZANidine (ZANAFLEX) 4 MG tablet Take 4 mg by mouth at bedtime.    . Vitamin  D, Ergocalciferol, (DRISDOL) 50000 units CAPS capsule Take 50,000 Units by mouth every Wednesday.     Social History   Socioeconomic History  . Marital status: Single    Spouse name: Not on file  . Number of children: Not on file  . Years of education: Not on file  . Highest education level: Not on file  Occupational History  . Not on file  Tobacco Use  . Smoking status: Former Smoker    Types: Cigarettes, Cigars  . Smokeless tobacco: Never Used  . Tobacco comment: quit 2015  Vaping Use  . Vaping Use: Never used  Substance and Sexual Activity  . Alcohol use: No    Comment: some etoh 20 years ago  . Drug use: No    Comment: not since 2005  . Sexual activity: Not on file  Other Topics Concern  . Not on file  Social History Narrative  . Not on file   Social Determinants of Health   Financial Resource Strain: Not on file  Food Insecurity: Not on file  Transportation Needs: Not on file  Physical Activity: Not on file  Stress: Not on file  Social Connections: Not on file  Intimate Partner Violence: Not on file   Family History  Problem Relation Age of Onset  . Colon cancer Neg Hx     OBJECTIVE:  Vitals:   07/30/20 1438 07/30/20  1443  BP: (!) 157/73   Pulse: 63   Resp: 16   Temp: 97.8 F (36.6 C)   TempSrc: Oral   SpO2: 97%   Weight:  235 lb (106.6 kg)    General appearance: ALERT; in no acute distress.  Head: NCAT Lungs: Normal respiratory effort CV: bilateral dorsalis pedis pulse 2+ Musculoskeletal: Bilateral LE Inspection: Swelling diffuse about the bilateral LEs, 1+ pitting edema Palpation: TTP diffuse about bilateral feet Skin: warm and dry Neurologic: Ambulates without difficulty Psychological: alert and cooperative; normal mood and affect   ASSESSMENT & PLAN:  1. Other polyneuropathy   2. Leg swelling     Meds ordered this encounter  Medications  . Lido-Capsaicin-Men-Methyl Sal (1ST MEDX-PATCH/ LIDOCAINE) 4-0.373-09-11 % PTCH    Sig:  Apply one patch to lower extremities.  Patches may remain in place for no more than 12 hours in any 24-hour period    Dispense:  30 patch    Refill:  1    Order Specific Question:   Supervising Provider    Answer:   Raylene Everts [[id number]]    Continue conservative management of rest, ice, and elevation Continue with gabapentin Lidocaine patches prescribed.  Use as directed Drink water and avoid foods and drinks with high sodium/ sugar content as this may contribute to swelling Wear compression stockings Follow up with PCP for recheck next week Return or go to the ER if you have any new or worsening symptoms (fever, chills, chest pain, redness, swelling, shortness of breath, nausea, vomiting, calf pain, etc...)   Reviewed expectations re: course of current medical issues. Questions answered. Outlined signs and symptoms indicating need for more acute intervention. Patient verbalized understanding. After Visit Summary given.    Lestine Box, PA-C 07/30/20 1526

## 2020-07-30 NOTE — Discharge Instructions (Signed)
Continue conservative management of rest, ice, and elevation Continue with gabapentin Lidocaine patches prescribed.  Use as directed Drink water and avoid foods and drinks with high sodium/ sugar content as this may contribute to swelling Wear compression stockings Follow up with PCP for recheck next week Return or go to the ER if you have any new or worsening symptoms (fever, chills, chest pain, redness, swelling, shortness of breath, nausea, vomiting, calf pain, etc...)

## 2020-07-30 NOTE — ED Triage Notes (Signed)
Bilateral feet swelling and burning.  Saw PCP and he told him he has neuropathy.  Pt doesn't have diabetes nor has had any chemotherapy treatments.  PCP started him on gabapentin.  He doesn't think it has helped his feet.

## 2020-09-11 DIAGNOSIS — I10 Essential (primary) hypertension: Secondary | ICD-10-CM | POA: Diagnosis not present

## 2020-09-11 DIAGNOSIS — R040 Epistaxis: Secondary | ICD-10-CM | POA: Diagnosis not present

## 2020-09-11 DIAGNOSIS — E785 Hyperlipidemia, unspecified: Secondary | ICD-10-CM | POA: Diagnosis not present

## 2020-09-11 DIAGNOSIS — Z87891 Personal history of nicotine dependence: Secondary | ICD-10-CM | POA: Diagnosis not present

## 2020-10-05 DIAGNOSIS — G588 Other specified mononeuropathies: Secondary | ICD-10-CM | POA: Diagnosis not present

## 2020-10-05 DIAGNOSIS — N400 Enlarged prostate without lower urinary tract symptoms: Secondary | ICD-10-CM | POA: Diagnosis not present

## 2020-10-05 DIAGNOSIS — N182 Chronic kidney disease, stage 2 (mild): Secondary | ICD-10-CM | POA: Diagnosis not present

## 2020-10-05 DIAGNOSIS — E7849 Other hyperlipidemia: Secondary | ICD-10-CM | POA: Diagnosis not present

## 2020-10-05 DIAGNOSIS — Z6831 Body mass index (BMI) 31.0-31.9, adult: Secondary | ICD-10-CM | POA: Diagnosis not present

## 2020-10-05 DIAGNOSIS — M10072 Idiopathic gout, left ankle and foot: Secondary | ICD-10-CM | POA: Diagnosis not present

## 2020-10-13 DIAGNOSIS — R040 Epistaxis: Secondary | ICD-10-CM | POA: Diagnosis not present

## 2020-11-22 DIAGNOSIS — G588 Other specified mononeuropathies: Secondary | ICD-10-CM | POA: Diagnosis not present

## 2020-11-22 DIAGNOSIS — N400 Enlarged prostate without lower urinary tract symptoms: Secondary | ICD-10-CM | POA: Diagnosis not present

## 2020-11-22 DIAGNOSIS — N182 Chronic kidney disease, stage 2 (mild): Secondary | ICD-10-CM | POA: Diagnosis not present

## 2020-11-22 DIAGNOSIS — E7849 Other hyperlipidemia: Secondary | ICD-10-CM | POA: Diagnosis not present

## 2020-12-23 DIAGNOSIS — N400 Enlarged prostate without lower urinary tract symptoms: Secondary | ICD-10-CM | POA: Diagnosis not present

## 2020-12-23 DIAGNOSIS — M10072 Idiopathic gout, left ankle and foot: Secondary | ICD-10-CM | POA: Diagnosis not present

## 2020-12-23 DIAGNOSIS — N182 Chronic kidney disease, stage 2 (mild): Secondary | ICD-10-CM | POA: Diagnosis not present

## 2020-12-23 DIAGNOSIS — E7849 Other hyperlipidemia: Secondary | ICD-10-CM | POA: Diagnosis not present

## 2020-12-23 DIAGNOSIS — G588 Other specified mononeuropathies: Secondary | ICD-10-CM | POA: Diagnosis not present

## 2021-01-11 DIAGNOSIS — K7469 Other cirrhosis of liver: Secondary | ICD-10-CM | POA: Diagnosis not present

## 2021-01-11 DIAGNOSIS — Z6831 Body mass index (BMI) 31.0-31.9, adult: Secondary | ICD-10-CM | POA: Diagnosis not present

## 2021-01-22 DIAGNOSIS — E7849 Other hyperlipidemia: Secondary | ICD-10-CM | POA: Diagnosis not present

## 2021-01-22 DIAGNOSIS — I10 Essential (primary) hypertension: Secondary | ICD-10-CM | POA: Diagnosis not present

## 2021-02-22 DIAGNOSIS — E7849 Other hyperlipidemia: Secondary | ICD-10-CM | POA: Diagnosis not present

## 2021-02-22 DIAGNOSIS — I10 Essential (primary) hypertension: Secondary | ICD-10-CM | POA: Diagnosis not present

## 2021-04-08 MED ORDER — SIMVASTATIN 10 MG TABLET
10 | ORAL_TABLET | ORAL | 4 refills | 90.00000 days | Status: AC
Start: 2021-04-08 — End: 2022-05-09

## 2021-04-13 DIAGNOSIS — R0902 Hypoxemia: Secondary | ICD-10-CM | POA: Diagnosis not present

## 2021-04-13 DIAGNOSIS — R001 Bradycardia, unspecified: Secondary | ICD-10-CM | POA: Diagnosis not present

## 2021-04-13 DIAGNOSIS — I959 Hypotension, unspecified: Secondary | ICD-10-CM | POA: Diagnosis not present

## 2021-04-13 DIAGNOSIS — K767 Hepatorenal syndrome: Secondary | ICD-10-CM | POA: Diagnosis not present

## 2021-04-13 DIAGNOSIS — Z20822 Contact with and (suspected) exposure to covid-19: Secondary | ICD-10-CM | POA: Diagnosis not present

## 2021-04-13 DIAGNOSIS — A419 Sepsis, unspecified organism: Secondary | ICD-10-CM | POA: Diagnosis not present

## 2021-04-13 DIAGNOSIS — R935 Abnormal findings on diagnostic imaging of other abdominal regions, including retroperitoneum: Secondary | ICD-10-CM | POA: Diagnosis not present

## 2021-04-13 DIAGNOSIS — R2681 Unsteadiness on feet: Secondary | ICD-10-CM | POA: Diagnosis not present

## 2021-04-13 DIAGNOSIS — R0689 Other abnormalities of breathing: Secondary | ICD-10-CM | POA: Diagnosis not present

## 2021-04-13 DIAGNOSIS — R410 Disorientation, unspecified: Secondary | ICD-10-CM | POA: Diagnosis not present

## 2021-04-13 DIAGNOSIS — N179 Acute kidney failure, unspecified: Secondary | ICD-10-CM | POA: Diagnosis not present

## 2021-04-13 DIAGNOSIS — K7682 Hepatic encephalopathy: Secondary | ICD-10-CM | POA: Diagnosis not present

## 2021-04-13 DIAGNOSIS — K769 Liver disease, unspecified: Secondary | ICD-10-CM | POA: Diagnosis not present

## 2021-04-13 DIAGNOSIS — N320 Bladder-neck obstruction: Secondary | ICD-10-CM | POA: Diagnosis not present

## 2021-04-13 DIAGNOSIS — R079 Chest pain, unspecified: Secondary | ICD-10-CM | POA: Diagnosis not present

## 2021-04-13 DIAGNOSIS — R531 Weakness: Secondary | ICD-10-CM | POA: Diagnosis not present

## 2021-04-13 DIAGNOSIS — I700 Atherosclerosis of aorta: Secondary | ICD-10-CM | POA: Diagnosis not present

## 2021-04-16 DIAGNOSIS — A419 Sepsis, unspecified organism: Secondary | ICD-10-CM | POA: Diagnosis not present

## 2021-04-16 DIAGNOSIS — R2681 Unsteadiness on feet: Secondary | ICD-10-CM | POA: Diagnosis not present

## 2021-04-16 DIAGNOSIS — N179 Acute kidney failure, unspecified: Secondary | ICD-10-CM | POA: Diagnosis not present

## 2021-04-16 DIAGNOSIS — K767 Hepatorenal syndrome: Secondary | ICD-10-CM | POA: Diagnosis not present

## 2021-04-16 DIAGNOSIS — R079 Chest pain, unspecified: Secondary | ICD-10-CM | POA: Diagnosis not present

## 2021-04-16 DIAGNOSIS — I959 Hypotension, unspecified: Secondary | ICD-10-CM | POA: Diagnosis not present

## 2021-04-16 DIAGNOSIS — R6521 Severe sepsis with septic shock: Secondary | ICD-10-CM | POA: Diagnosis not present

## 2021-04-16 DIAGNOSIS — R188 Other ascites: Secondary | ICD-10-CM | POA: Diagnosis not present

## 2021-04-16 DIAGNOSIS — N320 Bladder-neck obstruction: Secondary | ICD-10-CM | POA: Diagnosis not present

## 2021-04-16 DIAGNOSIS — E872 Acidosis, unspecified: Secondary | ICD-10-CM | POA: Diagnosis not present

## 2021-04-16 DIAGNOSIS — C799 Secondary malignant neoplasm of unspecified site: Secondary | ICD-10-CM | POA: Diagnosis not present

## 2021-04-16 DIAGNOSIS — Z0389 Encounter for observation for other suspected diseases and conditions ruled out: Secondary | ICD-10-CM | POA: Diagnosis not present

## 2021-04-16 DIAGNOSIS — C228 Malignant neoplasm of liver, primary, unspecified as to type: Secondary | ICD-10-CM | POA: Diagnosis not present

## 2021-04-16 DIAGNOSIS — K7682 Hepatic encephalopathy: Secondary | ICD-10-CM | POA: Diagnosis not present

## 2021-04-16 DIAGNOSIS — I517 Cardiomegaly: Secondary | ICD-10-CM | POA: Diagnosis not present

## 2021-04-16 DIAGNOSIS — N170 Acute kidney failure with tubular necrosis: Secondary | ICD-10-CM | POA: Diagnosis not present

## 2021-04-16 DIAGNOSIS — K7290 Hepatic failure, unspecified without coma: Secondary | ICD-10-CM | POA: Insufficient documentation

## 2021-04-16 DIAGNOSIS — C229 Malignant neoplasm of liver, not specified as primary or secondary: Secondary | ICD-10-CM | POA: Diagnosis not present

## 2021-04-16 DIAGNOSIS — Z20822 Contact with and (suspected) exposure to covid-19: Secondary | ICD-10-CM | POA: Diagnosis not present

## 2021-04-16 DIAGNOSIS — I340 Nonrheumatic mitral (valve) insufficiency: Secondary | ICD-10-CM | POA: Diagnosis not present

## 2021-04-16 DIAGNOSIS — N390 Urinary tract infection, site not specified: Secondary | ICD-10-CM | POA: Diagnosis not present

## 2021-04-17 DIAGNOSIS — E872 Acidosis, unspecified: Secondary | ICD-10-CM | POA: Diagnosis not present

## 2021-04-17 DIAGNOSIS — N170 Acute kidney failure with tubular necrosis: Secondary | ICD-10-CM | POA: Diagnosis not present

## 2021-04-17 DIAGNOSIS — R6521 Severe sepsis with septic shock: Secondary | ICD-10-CM | POA: Diagnosis not present

## 2021-04-17 DIAGNOSIS — N390 Urinary tract infection, site not specified: Secondary | ICD-10-CM | POA: Diagnosis not present

## 2021-04-17 DIAGNOSIS — C228 Malignant neoplasm of liver, primary, unspecified as to type: Secondary | ICD-10-CM | POA: Diagnosis not present

## 2021-04-17 DIAGNOSIS — Z0389 Encounter for observation for other suspected diseases and conditions ruled out: Secondary | ICD-10-CM | POA: Diagnosis not present

## 2021-04-17 DIAGNOSIS — A419 Sepsis, unspecified organism: Secondary | ICD-10-CM | POA: Diagnosis not present

## 2021-04-17 DIAGNOSIS — C799 Secondary malignant neoplasm of unspecified site: Secondary | ICD-10-CM | POA: Diagnosis not present

## 2021-04-17 DIAGNOSIS — R188 Other ascites: Secondary | ICD-10-CM | POA: Diagnosis not present

## 2021-04-17 DIAGNOSIS — K767 Hepatorenal syndrome: Secondary | ICD-10-CM | POA: Diagnosis not present

## 2021-04-18 DIAGNOSIS — K767 Hepatorenal syndrome: Secondary | ICD-10-CM | POA: Diagnosis not present

## 2021-04-19 DIAGNOSIS — R188 Other ascites: Secondary | ICD-10-CM | POA: Diagnosis not present

## 2021-04-19 DIAGNOSIS — I517 Cardiomegaly: Secondary | ICD-10-CM | POA: Diagnosis not present

## 2021-04-19 DIAGNOSIS — C799 Secondary malignant neoplasm of unspecified site: Secondary | ICD-10-CM | POA: Diagnosis not present

## 2021-04-19 DIAGNOSIS — A419 Sepsis, unspecified organism: Secondary | ICD-10-CM | POA: Diagnosis not present

## 2021-04-19 DIAGNOSIS — K767 Hepatorenal syndrome: Secondary | ICD-10-CM | POA: Diagnosis not present

## 2021-04-19 DIAGNOSIS — R6521 Severe sepsis with septic shock: Secondary | ICD-10-CM | POA: Diagnosis not present

## 2021-04-19 DIAGNOSIS — E872 Acidosis, unspecified: Secondary | ICD-10-CM | POA: Diagnosis not present

## 2021-04-19 DIAGNOSIS — N390 Urinary tract infection, site not specified: Secondary | ICD-10-CM | POA: Diagnosis not present

## 2021-04-19 DIAGNOSIS — R079 Chest pain, unspecified: Secondary | ICD-10-CM | POA: Diagnosis not present

## 2021-04-19 DIAGNOSIS — C228 Malignant neoplasm of liver, primary, unspecified as to type: Secondary | ICD-10-CM | POA: Diagnosis not present

## 2021-04-19 DIAGNOSIS — N170 Acute kidney failure with tubular necrosis: Secondary | ICD-10-CM | POA: Diagnosis not present

## 2021-04-20 DIAGNOSIS — I517 Cardiomegaly: Secondary | ICD-10-CM | POA: Diagnosis not present

## 2021-04-20 DIAGNOSIS — K767 Hepatorenal syndrome: Secondary | ICD-10-CM | POA: Diagnosis not present

## 2021-04-20 DIAGNOSIS — I340 Nonrheumatic mitral (valve) insufficiency: Secondary | ICD-10-CM | POA: Diagnosis not present

## 2021-04-21 DIAGNOSIS — K767 Hepatorenal syndrome: Secondary | ICD-10-CM | POA: Diagnosis not present

## 2021-04-22 DIAGNOSIS — R160 Hepatomegaly, not elsewhere classified: Secondary | ICD-10-CM | POA: Insufficient documentation

## 2021-04-22 DIAGNOSIS — K767 Hepatorenal syndrome: Secondary | ICD-10-CM | POA: Diagnosis not present

## 2021-04-28 DIAGNOSIS — K7469 Other cirrhosis of liver: Secondary | ICD-10-CM | POA: Diagnosis not present

## 2021-04-28 DIAGNOSIS — N400 Enlarged prostate without lower urinary tract symptoms: Secondary | ICD-10-CM | POA: Diagnosis not present

## 2021-04-28 DIAGNOSIS — C224 Other sarcomas of liver: Secondary | ICD-10-CM | POA: Diagnosis not present

## 2021-04-30 DIAGNOSIS — K7031 Alcoholic cirrhosis of liver with ascites: Secondary | ICD-10-CM | POA: Diagnosis present

## 2021-05-05 ENCOUNTER — Encounter: Admit: 2021-05-05 | Payer: PRIVATE HEALTH INSURANCE | Primary: Family Medicine

## 2021-06-07 ENCOUNTER — Encounter: Admit: 2021-06-07 | Payer: PRIVATE HEALTH INSURANCE | Attending: Family Medicine | Primary: Family Medicine

## 2021-06-07 ENCOUNTER — Telehealth: Admit: 2021-06-07 | Payer: PRIVATE HEALTH INSURANCE | Attending: Family Medicine | Primary: Family Medicine

## 2021-06-07 MED ORDER — AZITHROMYCIN 250 MG TABLET
250 | ORAL_TABLET | Freq: Every day | ORAL | 1 refills | 5.00000 days | Status: AC
Start: 2021-06-07 — End: ?

## 2021-06-14 ENCOUNTER — Encounter: Admit: 2021-06-14 | Payer: PRIVATE HEALTH INSURANCE | Attending: Family Medicine | Primary: Family Medicine

## 2021-06-14 ENCOUNTER — Ambulatory Visit: Admit: 2021-06-14 | Payer: Medicare (Managed Care) | Attending: Family Medicine | Primary: Family Medicine

## 2021-06-14 ENCOUNTER — Ambulatory Visit: Admit: 2021-06-14 | Payer: PRIVATE HEALTH INSURANCE | Attending: Family Medicine | Primary: Family Medicine

## 2021-06-14 DIAGNOSIS — E6609 Other obesity due to excess calories: Secondary | ICD-10-CM

## 2021-06-14 DIAGNOSIS — E7800 Pure hypercholesterolemia, unspecified: Secondary | ICD-10-CM

## 2021-06-14 DIAGNOSIS — Z0000 Encounter for general adult medical examination without abnormal findings: Secondary | ICD-10-CM

## 2021-06-14 DIAGNOSIS — Z87442 Personal history of urinary calculi: Secondary | ICD-10-CM

## 2021-06-14 DIAGNOSIS — Z6833 Body mass index (BMI) 33.0-33.9, adult: Secondary | ICD-10-CM

## 2021-06-14 DIAGNOSIS — I6523 Occlusion and stenosis of bilateral carotid arteries: Secondary | ICD-10-CM

## 2021-08-02 ENCOUNTER — Encounter: Admit: 2021-08-02 | Payer: PRIVATE HEALTH INSURANCE | Attending: Family Medicine | Primary: Family Medicine

## 2021-08-02 MED ORDER — SILDENAFIL 100 MG TABLET
100 | ORAL_TABLET | ORAL | 4 refills | 30.00000 days | Status: AC
Start: 2021-08-02 — End: 2022-08-03

## 2021-08-09 DIAGNOSIS — N400 Enlarged prostate without lower urinary tract symptoms: Secondary | ICD-10-CM | POA: Diagnosis not present

## 2021-08-09 DIAGNOSIS — C224 Other sarcomas of liver: Secondary | ICD-10-CM | POA: Diagnosis not present

## 2021-08-09 DIAGNOSIS — R197 Diarrhea, unspecified: Secondary | ICD-10-CM | POA: Diagnosis not present

## 2021-08-09 DIAGNOSIS — K7469 Other cirrhosis of liver: Secondary | ICD-10-CM | POA: Diagnosis not present

## 2021-09-28 ENCOUNTER — Encounter: Admit: 2021-09-28 | Payer: PRIVATE HEALTH INSURANCE | Primary: Family Medicine

## 2021-09-30 DIAGNOSIS — I5031 Acute diastolic (congestive) heart failure: Secondary | ICD-10-CM | POA: Diagnosis not present

## 2021-10-04 ENCOUNTER — Telehealth: Admit: 2021-10-04 | Payer: PRIVATE HEALTH INSURANCE | Attending: Family Medicine | Primary: Family Medicine

## 2021-11-01 DIAGNOSIS — J189 Pneumonia, unspecified organism: Secondary | ICD-10-CM | POA: Diagnosis not present

## 2021-11-01 DIAGNOSIS — Z20822 Contact with and (suspected) exposure to covid-19: Secondary | ICD-10-CM | POA: Diagnosis not present

## 2021-11-01 DIAGNOSIS — R059 Cough, unspecified: Secondary | ICD-10-CM | POA: Diagnosis not present

## 2021-11-01 DIAGNOSIS — Z87891 Personal history of nicotine dependence: Secondary | ICD-10-CM | POA: Diagnosis not present

## 2021-11-01 DIAGNOSIS — E785 Hyperlipidemia, unspecified: Secondary | ICD-10-CM | POA: Diagnosis not present

## 2021-11-01 DIAGNOSIS — Z79899 Other long term (current) drug therapy: Secondary | ICD-10-CM | POA: Diagnosis not present

## 2021-11-01 DIAGNOSIS — R6883 Chills (without fever): Secondary | ICD-10-CM | POA: Diagnosis not present

## 2021-11-01 DIAGNOSIS — R197 Diarrhea, unspecified: Secondary | ICD-10-CM | POA: Diagnosis not present

## 2021-11-01 DIAGNOSIS — J90 Pleural effusion, not elsewhere classified: Secondary | ICD-10-CM | POA: Diagnosis not present

## 2021-11-01 DIAGNOSIS — I10 Essential (primary) hypertension: Secondary | ICD-10-CM | POA: Diagnosis not present

## 2021-11-04 ENCOUNTER — Encounter: Payer: Self-pay | Admitting: *Deleted

## 2021-11-04 DIAGNOSIS — J158 Pneumonia due to other specified bacteria: Secondary | ICD-10-CM | POA: Diagnosis not present

## 2021-11-04 DIAGNOSIS — Z6827 Body mass index (BMI) 27.0-27.9, adult: Secondary | ICD-10-CM | POA: Diagnosis not present

## 2022-02-07 DIAGNOSIS — Z6827 Body mass index (BMI) 27.0-27.9, adult: Secondary | ICD-10-CM | POA: Diagnosis not present

## 2022-02-07 DIAGNOSIS — E7849 Other hyperlipidemia: Secondary | ICD-10-CM | POA: Diagnosis not present

## 2022-02-07 DIAGNOSIS — K7469 Other cirrhosis of liver: Secondary | ICD-10-CM | POA: Diagnosis not present

## 2022-02-15 DIAGNOSIS — N281 Cyst of kidney, acquired: Secondary | ICD-10-CM | POA: Diagnosis not present

## 2022-02-15 DIAGNOSIS — K766 Portal hypertension: Secondary | ICD-10-CM | POA: Diagnosis not present

## 2022-02-15 DIAGNOSIS — N200 Calculus of kidney: Secondary | ICD-10-CM | POA: Diagnosis not present

## 2022-02-15 DIAGNOSIS — K7460 Unspecified cirrhosis of liver: Secondary | ICD-10-CM | POA: Diagnosis not present

## 2022-02-15 DIAGNOSIS — K7469 Other cirrhosis of liver: Secondary | ICD-10-CM | POA: Diagnosis not present

## 2022-02-17 DIAGNOSIS — Z1211 Encounter for screening for malignant neoplasm of colon: Secondary | ICD-10-CM | POA: Diagnosis not present

## 2022-02-21 ENCOUNTER — Encounter: Admit: 2022-02-21 | Payer: Medicare (Managed Care) | Primary: Family Medicine

## 2022-02-24 ENCOUNTER — Encounter: Admit: 2022-02-24 | Payer: PRIVATE HEALTH INSURANCE | Primary: Family Medicine

## 2022-02-25 ENCOUNTER — Encounter: Admit: 2022-02-25 | Payer: Medicare (Managed Care) | Primary: Family Medicine

## 2022-03-04 ENCOUNTER — Ambulatory Visit: Admit: 2022-03-04 | Payer: Medicare (Managed Care) | Primary: Family Medicine

## 2022-03-29 DIAGNOSIS — R1032 Left lower quadrant pain: Secondary | ICD-10-CM | POA: Diagnosis not present

## 2022-03-29 DIAGNOSIS — Z6827 Body mass index (BMI) 27.0-27.9, adult: Secondary | ICD-10-CM | POA: Diagnosis not present

## 2022-03-29 DIAGNOSIS — K5902 Outlet dysfunction constipation: Secondary | ICD-10-CM | POA: Diagnosis not present

## 2022-04-14 ENCOUNTER — Encounter: Payer: Self-pay | Admitting: Internal Medicine

## 2022-05-08 ENCOUNTER — Encounter: Admit: 2022-05-08 | Payer: PRIVATE HEALTH INSURANCE | Attending: Family Medicine | Primary: Family Medicine

## 2022-05-09 MED ORDER — SIMVASTATIN 10 MG TABLET
10 | ORAL_TABLET | ORAL | 4 refills | 90.00000 days | Status: AC
Start: 2022-05-09 — End: ?

## 2022-05-25 DIAGNOSIS — G621 Alcoholic polyneuropathy: Secondary | ICD-10-CM | POA: Diagnosis not present

## 2022-05-25 DIAGNOSIS — N182 Chronic kidney disease, stage 2 (mild): Secondary | ICD-10-CM | POA: Diagnosis not present

## 2022-05-25 DIAGNOSIS — K5902 Outlet dysfunction constipation: Secondary | ICD-10-CM | POA: Diagnosis not present

## 2022-05-25 DIAGNOSIS — R1032 Left lower quadrant pain: Secondary | ICD-10-CM | POA: Diagnosis not present

## 2022-05-25 DIAGNOSIS — I5032 Chronic diastolic (congestive) heart failure: Secondary | ICD-10-CM | POA: Diagnosis not present

## 2022-05-25 DIAGNOSIS — Z6829 Body mass index (BMI) 29.0-29.9, adult: Secondary | ICD-10-CM | POA: Diagnosis not present

## 2022-05-25 DIAGNOSIS — N400 Enlarged prostate without lower urinary tract symptoms: Secondary | ICD-10-CM | POA: Diagnosis not present

## 2022-05-27 ENCOUNTER — Encounter: Payer: Self-pay | Admitting: Gastroenterology

## 2022-05-30 ENCOUNTER — Encounter: Payer: Self-pay | Admitting: Gastroenterology

## 2022-05-30 ENCOUNTER — Encounter: Payer: Self-pay | Admitting: *Deleted

## 2022-05-30 ENCOUNTER — Ambulatory Visit (INDEPENDENT_AMBULATORY_CARE_PROVIDER_SITE_OTHER): Payer: Medicare HMO | Admitting: Gastroenterology

## 2022-05-30 ENCOUNTER — Other Ambulatory Visit: Payer: Self-pay | Admitting: *Deleted

## 2022-05-30 VITALS — BP 122/69 | HR 48 | Temp 97.7°F | Ht 71.0 in | Wt 205.4 lb

## 2022-05-30 DIAGNOSIS — K7460 Unspecified cirrhosis of liver: Secondary | ICD-10-CM

## 2022-05-30 DIAGNOSIS — Z86010 Personal history of colonic polyps: Secondary | ICD-10-CM | POA: Diagnosis not present

## 2022-05-30 MED ORDER — PEG 3350-KCL-NA BICARB-NACL 420 G PO SOLR: 4000.0000 mL | Freq: Once | ORAL | 0 refills | Status: AC

## 2022-05-30 NOTE — Patient Instructions (Addendum)
Please complete labs. We will be in touch with results within 5 business days.  Colonoscopy to be scheduled. See separate instructions. If Linzess is too strong, you can try taking every other day. The number of stools may decrease with time over the next 1-2 weeks but we don't want you to continue having 6-7 stools daily due to risk of dehydration. So either try taking every other day OR you can continue taking 1/2 dose daily (you can open up contents of capsule into medicine cup and mix with couple of teaspoons of water. Drink half the mixture and throw the other half away).

## 2022-05-30 NOTE — H&P (View-Only) (Signed)
GI Office Note    Referring Provider: Neale Burly, MD Primary Care Physician:  Neale Burly, MD  Primary Gastroenterologist: Garfield Cornea, MD   Chief Complaint   Chief Complaint  Patient presents with   Colonoscopy     History of Present Illness   Mike Moreno is a 77 y.o. male presenting today at the request of Dr. Sherrie Sport for colonoscopy. Due to his history of cirrhosis, office visit was made.   CT A/P without contrast 01/2020 done after a fall, showing cirrhosis. Subsequent CT in 03/2021 with multiple hypodensities in the periphery of the liver, at one time thought worrisome for primary or metastatic neoplasm but now thought to be areas of asymmetry and fatty replacement or areas of hepatitis. Last CT as outlined below.   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 Concern for possible HCC but MRI not an option due to elevated creatinine. Patient elected to go home with comfort measures and no further work up of liver lesions. Labs 03/2021: Hep B surf Ag negative, Hep C Ab <0.1, AFP 3.5.  Labs from October 2023: Creatinine 1.38, BUN 24, sodium 142, albumin 3.1, total bilirubin 1.7, alkaline phosphatase 108, AST 60, ALT 31  CT Abd without contrast 01/2022: 1. 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 Single subcentimeter low-density lesion in the right  hepatic lobe remains too small to characterize. No definite new  liver lesions identified on noncontrast imaging.  2. Question mild wall thickening of the gastric antrum and proximal  duodenum, which may be secondary to underdistention or  gastritis/duodenitis.  3. Small left pleural effusion with adjacent atelectasis. Volume of  effusion has slightly decreased from prior.  4. Nonobstructing right renal stone.  5. Aortic atherosclerosis (ICD10-I70.0).   Today: he is due for surveillance colonoscopy due to history of  colon polyps. He had been having constipation. Started Linzess 29mg last week. Has been having 6-7 stools per day. Associated with urgency. No melena, brbpr. No abdominal pain. No heartburn, dysphagia, vomiting. No recent swelling.   Patient states he is not seeing a GI or hepatologist for his cirrhosis. He has labs done by PCP. His brother had cirrhosis felt to be due to etoh use. Patient states he used to drink regularly but quit in 1983. Remote drug use. Patient still works.   No prior EGD. He had thrombocytopenia (91000) in 03/2021. He is on propranolol '10mg'$  BID but he is unsure purpose. He has never had EGD. Also on midodrine, furosemide, and spironolactone.    Colonoscopy 11/2016: -one 453mpolyp in rectum, benign -nodular ileal mucosa, benign -surveillance colonoscopy in 5 years    Medications   Current Outpatient Medications  Medication Sig Dispense Refill   alfuzosin (UROXATRAL) 10 MG 24 hr tablet Take 10 mg by mouth daily.     furosemide (LASIX) 40 MG tablet Take 40 mg by mouth daily.     gabapentin (NEURONTIN) 100 MG capsule Take 100 mg by mouth at bedtime.     LINZESS 290 MCG CAPS capsule Take 290 mcg by mouth daily.     midodrine (PROAMATINE) 5 MG tablet Take 5 mg by mouth 3 (three) times daily.     propranolol (INDERAL) 10 MG tablet Take 10 mg by mouth 2 (two) times daily.     sodium bicarbonate 650 MG tablet Take 650 mg by mouth 2 (two) times daily.     spironolactone (ALDACTONE) 25  MG tablet Take 25 mg by mouth daily.     No current facility-administered medications for this visit.    Allergies   Allergies as of 05/30/2022   (No Known Allergies)    Past Medical History   Past Medical History:  Diagnosis Date   BPH (benign prostatic hyperplasia)    Cirrhosis (HCC)    HTN (hypertension)    Hyperlipidemia     Past Surgical History   Past Surgical History:  Procedure Laterality Date   bilateral inguinal hernias     BIOPSY  12/07/2016   Procedure: BIOPSY;   Surgeon: Daneil Dolin, MD;  [facility];  Service: Endoscopy;;  ileocecal valve   COLONOSCOPY  08/2011   According to [facility] notes, 2 tubular adenomatous colon polyps removed next surveillance colonoscopy in 5 years   COLONOSCOPY N/A 12/07/2016   Procedure: COLONOSCOPY;  Surgeon: Daneil Dolin, MD;  [facility];  Service: Endoscopy;  Laterality: N/A;  7    HAND SURGERY     traumatic injury   POLYPECTOMY  12/07/2016   Procedure: POLYPECTOMY;  Surgeon: Daneil Dolin, MD;  [facility];  Service: Endoscopy;;  colon   UMBILICAL HERNIA REPAIR      Past Family History   Family History  Problem Relation Age of Onset   Colon cancer Neg Hx     Past Social History   Social History   Socioeconomic History   Marital status: Single    Spouse name: Not on file   Number of children: Not on file   Years of education: Not on file   Highest education level: Not on file  Occupational History   Not on file  Tobacco Use   Smoking status: Former    Types: Cigarettes, Cigars   Smokeless tobacco: Never   Tobacco comments:    quit 2015  Vaping Use   Vaping Use: Never used  Substance and Sexual Activity   Alcohol use: No    Comment: some etoh 20 years ago   Drug use: No    Comment: not since 2005   Sexual activity: Not Currently  Other Topics Concern   Not on file  Social History Narrative   Not on file   Social Determinants of Health   Financial Resource Strain: Not on file  Food Insecurity: Not on file  Transportation Needs: Not on file  Physical Activity: Not on file  Stress: Not on file  Social Connections: Not on file  Intimate Partner Violence: Not on file    Review of Systems   General: Negative for anorexia, weight loss, fever, chills, fatigue, weakness. Eyes: Negative for vision changes.  ENT: Negative for hoarseness, difficulty swallowing , nasal congestion. CV: Negative for chest pain, angina, palpitations, dyspnea on  exertion, +intermittent peripheral edema none currently.  Respiratory: Negative for dyspnea at rest, dyspnea on exertion, cough, sputum, wheezing.  GI: See history of present illness. GU:  Negative for dysuria, hematuria, urinary incontinence, urinary frequency, nocturnal urination.  MS: Negative for joint pain, low back pain.  Derm: Negative for rash or itching.  Neuro: Negative for weakness, abnormal sensation, seizure, frequent headaches, memory loss,  confusion.  Psych: Negative for anxiety, depression, suicidal ideation, hallucinations.  Endo: Negative for unusual weight change.  Heme: Negative for bruising or bleeding. Allergy: Negative for rash or hives.  Physical Exam   BP 122/69 (BP Location: Right Arm, Patient Position: Sitting, Cuff Size: Large)   Pulse (!) 48  Temp 97.7 F (36.5 C) (Oral)   Ht '5\' 11"'$  (1.803 m)   Wt 205 lb 6.4 oz (93.2 kg)   SpO2 100%   BMI 28.65 kg/m    General: elderly male in NAD.   Head: Normocephalic, atraumatic.   Eyes: Conjunctiva pink, no icterus. Mouth: Oropharyngeal mucosa moist and pink  Neck: Supple without thyromegaly, masses, or lymphadenopathy.  Lungs: Clear to auscultation bilaterally.  Heart: Regular rate and rhythm, no murmurs rubs or gallops.  Abdomen: Bowel sounds are normal, nontender, nondistended, no hepatosplenomegaly or masses, no abdominal bruits or hernia, no rebound or guarding.  Caput medusae  Rectal: not performed Extremities: No lower extremity edema. No clubbing or deformities.  Neuro: Alert and oriented x 4 , grossly normal neurologically.  Skin: Warm and dry, no rash or jaundice.   Psych: Alert and cooperative, normal mood and affect.  Labs   See hpi Imaging Studies   No results found.  Assessment   H/O colon polyps: patient desires one last colonoscopy. He has history of adenomatous colon polyps previous. Recently started on Linzess for constipation. Dosage may be too strong for him. We discussed taking  every other day or making a slurry and consuming only 1/2 of the dose per day.   Cirrhosis: noted on imaging couple of years ago. No recent CBC on file, but he did have thrombocytopenia in 2022. We discusses screening for varices and at this time he was not interested. He is on propranolol with good rate control, it is unclear who or when this was started and for which purpose. He was started on midodrine during his hospitalization in 03/2021. Also back on diuretics as outlined above. No further episodes of confusion. While in hospital in 03/2021 his ammonia levels were minimally elevated. Confusion could have been secondary to uremia.   Long discussion with patient, he should be having hepatoma screening twice yearly with AFP and RUQ U/S. Would also recommend labs to check MELD twice yearly. Vaccinate for Hep A and B if not immune. Recommend EGD to screen for varices due to risk of life threatening bleeding that can occur. He is agreeable to labs for now.    PLAN   Colonoscopy with Dr. Gala Romney. ASA 3.  I have discussed the risks, alternatives, benefits with regards to but not limited to the risk of reaction to medication, bleeding, infection, perforation and the patient is agreeable to proceed. Written consent to be obtained. Reduce Linzess to 236mg every other day or 1/2 dose daily. Labs.  Recommend EGD to screen for varices whenever he is ready.  LLaureen Ochs LBobby Rumpf [facility]

## 2022-05-30 NOTE — Progress Notes (Signed)
   GI Office Note    Referring Provider: Hasanaj, Xaje A, MD Primary Care Physician:  Hasanaj, Xaje A, MD  Primary Gastroenterologist: Michael Rourk, MD   Chief Complaint   Chief Complaint  Patient presents with   Colonoscopy     History of Present Illness   Mike Moreno is a 77 y.o. male presenting today at the request of Dr. Hasanaj for colonoscopy. Due to his history of cirrhosis, office visit was made.   CT A/P without contrast 01/2020 done after a fall, showing cirrhosis. Subsequent CT in 03/2021 with multiple hypodensities in the periphery of the liver, at one time thought worrisome for primary or metastatic neoplasm but now thought to be areas of asymmetry and fatty replacement or areas of hepatitis. Last CT as outlined below.   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 Concern for possible HCC but MRI not an option due to elevated creatinine. Patient elected to go home with comfort measures and no further work up of liver lesions. Labs 03/2021: Hep B surf Ag negative, Hep C Ab <0.1, AFP 3.5.  Labs from October 2023: Creatinine 1.38, BUN 24, sodium 142, albumin 3.1, total bilirubin 1.7, alkaline phosphatase 108, AST 60, ALT 31  CT Abd without contrast 01/2022: 1. 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 Single subcentimeter low-density lesion in the right  hepatic lobe remains too small to characterize. No definite new  liver lesions identified on noncontrast imaging.  2. Question mild wall thickening of the gastric antrum and proximal  duodenum, which may be secondary to underdistention or  gastritis/duodenitis.  3. Small left pleural effusion with adjacent atelectasis. Volume of  effusion has slightly decreased from prior.  4. Nonobstructing right renal stone.  5. Aortic atherosclerosis (ICD10-I70.0).   Today: he is due for surveillance colonoscopy due to history of  colon polyps. He had been having constipation. Started Linzess 290mcg last week. Has been having 6-7 stools per day. Associated with urgency. No melena, brbpr. No abdominal pain. No heartburn, dysphagia, vomiting. No recent swelling.   Patient states he is not seeing a GI or hepatologist for his cirrhosis. He has labs done by PCP. His brother had cirrhosis felt to be due to etoh use. Patient states he used to drink regularly but quit in 1983. Remote drug use. Patient still works.   No prior EGD. He had thrombocytopenia (91000) in 03/2021. He is on propranolol 10mg BID but he is unsure purpose. He has never had EGD. Also on midodrine, furosemide, and spironolactone.    Colonoscopy 11/2016: -one 4mm polyp in rectum, benign -nodular ileal mucosa, benign -surveillance colonoscopy in 5 years    Medications   Current Outpatient Medications  Medication Sig Dispense Refill   alfuzosin (UROXATRAL) 10 MG 24 hr tablet Take 10 mg by mouth daily.     furosemide (LASIX) 40 MG tablet Take 40 mg by mouth daily.     gabapentin (NEURONTIN) 100 MG capsule Take 100 mg by mouth at bedtime.     LINZESS 290 MCG CAPS capsule Take 290 mcg by mouth daily.     midodrine (PROAMATINE) 5 MG tablet Take 5 mg by mouth 3 (three) times daily.     propranolol (INDERAL) 10 MG tablet Take 10 mg by mouth 2 (two) times daily.     sodium bicarbonate 650 MG tablet Take 650 mg by mouth 2 (two) times daily.     spironolactone (ALDACTONE) 25   MG tablet Take 25 mg by mouth daily.     No current facility-administered medications for this visit.    Allergies   Allergies as of 05/30/2022   (No Known Allergies)    Past Medical History   Past Medical History:  Diagnosis Date   BPH (benign prostatic hyperplasia)    Cirrhosis (HCC)    HTN (hypertension)    Hyperlipidemia     Past Surgical History   Past Surgical History:  Procedure Laterality Date   bilateral inguinal hernias     BIOPSY  12/07/2016   Procedure: BIOPSY;   Surgeon: Rourk, Robert M, MD;  [facility]: [facility];  Service: Endoscopy;;  ileocecal valve   COLONOSCOPY  08/2011   [facility] 2 tubular adenomatous colon polyps removed next surveillance colonoscopy in 5 years   COLONOSCOPY N/A 12/07/2016   Procedure: COLONOSCOPY;  Surgeon: Rourk, Robert M, MD;  Location: [facility];  Service: Endoscopy;  Laterality: N/A;  1030    HAND SURGERY     traumatic injury   POLYPECTOMY  12/07/2016   Procedure: POLYPECTOMY;  Surgeon: Rourk, Robert M, MD;  Location: [facility];  Service: Endoscopy;;  colon   UMBILICAL HERNIA REPAIR      Past Family History   Family History  Problem Relation Age of Onset   Colon cancer Neg Hx     Past Social History   Social History   Socioeconomic History   Marital status: Single    Spouse name: Not on file   Number of children: Not on file   Years of education: Not on file   Highest education level: Not on file  Occupational History   Not on file  Tobacco Use   Smoking status: Former    Types: Cigarettes, Cigars   Smokeless tobacco: Never   Tobacco comments:    quit 2015  Vaping Use   Vaping Use: Never used  Substance and Sexual Activity   Alcohol use: No    Comment: some etoh 20 years ago   Drug use: No    Comment: not since 2005   Sexual activity: Not Currently  Other Topics Concern   Not on file  Social History Narrative   Not on file   Social Determinants of Health   Financial Resource Strain: Not on file  Food Insecurity: Not on file  Transportation Needs: Not on file  Physical Activity: Not on file  Stress: Not on file  Social Connections: Not on file  Intimate Partner Violence: Not on file    Review of Systems   General: Negative for anorexia, weight loss, fever, chills, fatigue, weakness. Eyes: Negative for vision changes.  ENT: Negative for hoarseness, difficulty swallowing , nasal congestion. CV: Negative for chest pain, angina, palpitations, dyspnea on  exertion, +intermittent peripheral edema none currently.  Respiratory: Negative for dyspnea at rest, dyspnea on exertion, cough, sputum, wheezing.  GI: See history of present illness. GU:  Negative for dysuria, hematuria, urinary incontinence, urinary frequency, nocturnal urination.  MS: Negative for joint pain, low back pain.  Derm: Negative for rash or itching.  Neuro: Negative for weakness, abnormal sensation, seizure, frequent headaches, memory loss,  confusion.  Psych: Negative for anxiety, depression, suicidal ideation, hallucinations.  Endo: Negative for unusual weight change.  Heme: Negative for bruising or bleeding. Allergy: Negative for rash or hives.  Physical Exam   BP 122/69 (BP Location: Right Arm, Patient Position: Sitting, Cuff Size: Large)   Pulse (!) 48     Temp 97.7 F (36.5 C) (Oral)   Ht 5' 11" (1.803 m)   Wt 205 lb 6.4 oz (93.2 kg)   SpO2 100%   BMI 28.65 kg/m    General: elderly male in NAD.   Head: Normocephalic, atraumatic.   Eyes: Conjunctiva pink, no icterus. Mouth: Oropharyngeal mucosa moist and pink  Neck: Supple without thyromegaly, masses, or lymphadenopathy.  Lungs: Clear to auscultation bilaterally.  Heart: Regular rate and rhythm, no murmurs rubs or gallops.  Abdomen: Bowel sounds are normal, nontender, nondistended, no hepatosplenomegaly or masses, no abdominal bruits or hernia, no rebound or guarding.  Caput medusae  Rectal: not performed Extremities: No lower extremity edema. No clubbing or deformities.  Neuro: Alert and oriented x 4 , grossly normal neurologically.  Skin: Warm and dry, no rash or jaundice.   Psych: Alert and cooperative, normal mood and affect.  Labs   See hpi Imaging Studies   No results found.  Assessment   H/O colon polyps: patient desires one last colonoscopy. He has history of adenomatous colon polyps previous. Recently started on Linzess for constipation. Dosage may be too strong for him. We discussed taking  every other day or making a slurry and consuming only 1/2 of the dose per day.   Cirrhosis: noted on imaging couple of years ago. No recent CBC on file, but he did have thrombocytopenia in 2022. We discusses screening for varices and at this time he was not interested. He is on propranolol with good rate control, it is unclear who or when this was started and for which purpose. He was started on midodrine during his hospitalization in 03/2021. Also back on diuretics as outlined above. No further episodes of confusion. While in hospital in 03/2021 his ammonia levels were minimally elevated. Confusion could have been secondary to uremia.   Long discussion with patient, he should be having hepatoma screening twice yearly with AFP and RUQ U/S. Would also recommend labs to check MELD twice yearly. Vaccinate for Hep A and B if not immune. Recommend EGD to screen for varices due to risk of life threatening bleeding that can occur. He is agreeable to labs for now.    PLAN   Colonoscopy with Dr. Rourk. ASA 3.  I have discussed the risks, alternatives, benefits with regards to but not limited to the risk of reaction to medication, bleeding, infection, perforation and the patient is agreeable to proceed. Written consent to be obtained. Reduce Linzess to 290mcg every other day or 1/2 dose daily. Labs.  Recommend EGD to screen for varices whenever he is ready.  Shaylene Paganelli S. Rainen Vanrossum, MHS, PA-C [facility]  

## 2022-05-31 ENCOUNTER — Encounter: Payer: Self-pay | Admitting: Gastroenterology

## 2022-05-31 ENCOUNTER — Encounter: Payer: Self-pay | Admitting: *Deleted

## 2022-05-31 LAB — CBC WITH DIFFERENTIAL/PLATELET

## 2022-06-01 LAB — CBC WITH DIFFERENTIAL/PLATELET
Basophils Absolute: 0 10*3/uL (ref 0.0–0.2)
Basos: 1 %
EOS (ABSOLUTE): 0.1 10*3/uL (ref 0.0–0.4)
Eos: 3 %
Hematocrit: 37.3 % — ABNORMAL LOW (ref 37.5–51.0)
Hemoglobin: 13.1 g/dL (ref 13.0–17.7)
Immature Grans (Abs): 0 10*3/uL (ref 0.0–0.1)
Immature Granulocytes: 0 %
Lymphocytes Absolute: 1 10*3/uL (ref 0.7–3.1)
Lymphs: 22 %
MCH: 37.1 pg — ABNORMAL HIGH (ref 26.6–33.0)
MCHC: 35.1 g/dL (ref 31.5–35.7)
MCV: 106 fL — ABNORMAL HIGH (ref 79–97)
Monocytes Absolute: 0.5 10*3/uL (ref 0.1–0.9)
Monocytes: 12 %
Neutrophils Absolute: 2.9 10*3/uL (ref 1.4–7.0)
Neutrophils: 62 %
Platelets: 78 10*3/uL — CL (ref 150–450)
RBC: 3.53 x10E6/uL — ABNORMAL LOW (ref 4.14–5.80)
RDW: 12.5 % (ref 11.6–15.4)
WBC: 4.6 10*3/uL (ref 3.4–10.8)

## 2022-06-01 LAB — COMPREHENSIVE METABOLIC PANEL
ALT: 40 IU/L (ref 0–44)
AST: 75 IU/L — ABNORMAL HIGH (ref 0–40)
Albumin/Globulin Ratio: 1.1 — ABNORMAL LOW (ref 1.2–2.2)
Albumin: 3.6 g/dL — ABNORMAL LOW (ref 3.8–4.8)
Alkaline Phosphatase: 111 IU/L (ref 44–121)
BUN/Creatinine Ratio: 22 (ref 10–24)
BUN: 28 mg/dL — ABNORMAL HIGH (ref 8–27)
Bilirubin Total: 1.7 mg/dL — ABNORMAL HIGH (ref 0.0–1.2)
CO2: 19 mmol/L — ABNORMAL LOW (ref 20–29)
Calcium: 9.3 mg/dL (ref 8.6–10.2)
Chloride: 108 mmol/L — ABNORMAL HIGH (ref 96–106)
Creatinine, Ser: 1.3 mg/dL — ABNORMAL HIGH (ref 0.76–1.27)
Globulin, Total: 3.3 g/dL (ref 1.5–4.5)
Glucose: 103 mg/dL — ABNORMAL HIGH (ref 70–99)
Potassium: 4.5 mmol/L (ref 3.5–5.2)
Sodium: 140 mmol/L (ref 134–144)
Total Protein: 6.9 g/dL (ref 6.0–8.5)
eGFR: 57 mL/min/{1.73_m2} — ABNORMAL LOW (ref >59)

## 2022-06-01 LAB — IRON,TIBC AND FERRITIN PANEL
Ferritin: 333 ng/mL (ref 30–400)
Iron Saturation: 48 % (ref 15–55)
Iron: 131 ug/dL (ref 38–169)
Total Iron Binding Capacity: 272 ug/dL (ref 250–450)
UIBC: 141 ug/dL (ref 111–343)

## 2022-06-01 LAB — HEPATITIS B SURFACE ANTIBODY,QUALITATIVE: Hep B Surface Ab, Qual: NONREACTIVE

## 2022-06-01 LAB — AFP TUMOR MARKER: AFP, Serum, Tumor Marker: 7.7 ng/mL (ref 0.0–8.4)

## 2022-06-01 LAB — PROTIME-INR
INR: 1.3 — ABNORMAL HIGH (ref 0.9–1.2)
Prothrombin Time: 13.4 s — ABNORMAL HIGH (ref 9.1–12.0)

## 2022-06-01 LAB — IGA: IgA/Immunoglobulin A, Serum: 677 mg/dL — ABNORMAL HIGH (ref 61–437)

## 2022-06-01 LAB — HEPATITIS A ANTIBODY, TOTAL: hep A Total Ab: POSITIVE — AB

## 2022-06-01 LAB — TISSUE TRANSGLUTAMINASE, IGA: Transglutaminase IgA: <2 U/mL (ref 0–3)

## 2022-06-03 ENCOUNTER — Other Ambulatory Visit: Payer: Self-pay

## 2022-06-03 DIAGNOSIS — K7460 Unspecified cirrhosis of liver: Secondary | ICD-10-CM

## 2022-06-16 ENCOUNTER — Ambulatory Visit: Admit: 2022-06-16 | Payer: PRIVATE HEALTH INSURANCE | Attending: Family Medicine | Primary: Family Medicine

## 2022-06-16 ENCOUNTER — Ambulatory Visit: Admit: 2022-06-16 | Payer: Medicare (Managed Care) | Attending: Family Medicine | Primary: Family Medicine

## 2022-06-16 DIAGNOSIS — Z0000 Encounter for general adult medical examination without abnormal findings: Secondary | ICD-10-CM

## 2022-06-16 DIAGNOSIS — Z87442 Personal history of urinary calculi: Secondary | ICD-10-CM

## 2022-06-16 DIAGNOSIS — R030 Elevated blood-pressure reading, without diagnosis of hypertension: Secondary | ICD-10-CM

## 2022-06-16 DIAGNOSIS — I6523 Occlusion and stenosis of bilateral carotid arteries: Secondary | ICD-10-CM

## 2022-06-16 DIAGNOSIS — E7800 Pure hypercholesterolemia, unspecified: Secondary | ICD-10-CM

## 2022-06-16 NOTE — Patient Instructions (Signed)
Mike Moreno  06/16/2022     @PREFPERIOPPHARMACY$ @   Your procedure is scheduled on  06/22/2022.   Report to [facility] at  1145  A.M.   Call this number if you have problems the morning of surgery:  [phone]  If you experience any cold or flu symptoms such as cough, fever, chills, shortness of breath, etc. between now and your scheduled surgery, please notify us at the above number.   Remember:  Follow the diet and prep instructions given to you by the office.    Take these medicines the morning of surgery with A SIP OF WATER             uroxatral, gabapentin, midodrine, propranolol.     Do not wear jewelry, make-up or nail polish.  Do not wear lotions, powders, or perfumes, or deodorant.  Do not shave 48 hours prior to surgery.  Men may shave face and neck.  Do not bring valuables to the hospital.  [facility] is not responsible for any belongings or valuables.  Contacts, dentures or bridgework may not be worn into surgery.  Leave your suitcase in the car.  After surgery it may be brought to your room.  For patients admitted to the hospital, discharge time will be determined by your treatment team.  Patients discharged the day of surgery will not be allowed to drive home and must have someone with them for 24 hours.    Special instructions:   DO NOT smoke tobacco or vape for 24 hours before your procedure.  Please read over the following fact sheets that you were given. Anesthesia Post-op Instructions and Care and Recovery After Surgery      Colonoscopy, Adult, Care After The following information offers guidance on how to care for yourself after your procedure. Your health care provider may also give you more specific instructions. If you have problems or questions, contact your health care provider. What can I expect after the procedure? After the procedure, it is common to have: A small amount of blood in your stool for 24 hours after the  procedure. Some gas. Mild cramping or bloating of your abdomen. Follow these instructions at home: Eating and drinking  Drink enough fluid to keep your urine pale yellow. Follow instructions from your health care provider about eating or drinking restrictions. Resume your normal diet as told by your health care provider. Avoid heavy or fried foods that are hard to digest. Activity Rest as told by your health care provider. Avoid sitting for a long time without moving. Get up to take short walks every 1-2 hours. This is important to improve blood flow and breathing. Ask for help if you feel weak or unsteady. Return to your normal activities as told by your health care provider. Ask your health care provider what activities are safe for you. Managing cramping and bloating  Try walking around when you have cramps or feel bloated. If directed, apply heat to your abdomen as told by your health care provider. Use the heat source that your health care provider recommends, such as a moist heat pack or a heating pad. Place a towel between your skin and the heat source. Leave the heat on for 20-30 minutes. Remove the heat if your skin turns bright red. This is especially important if you are unable to feel pain, heat, or cold. You have a greater risk of getting burned. General instructions If you were given a sedative  during the procedure, it can affect you for several hours. Do not drive or operate machinery until your health care provider says that it is safe. For the first 24 hours after the procedure: Do not sign important documents. Do not drink alcohol. Do your regular daily activities at a slower pace than normal. Eat soft foods that are easy to digest. Take over-the-counter and prescription medicines only as told by your health care provider. Keep all follow-up visits. This is important. Contact a health care provider if: You have blood in your stool 2-3 days after the procedure. Get  help right away if: You have more than a small spotting of blood in your stool. You have large blood clots in your stool. You have swelling of your abdomen. You have nausea or vomiting. You have a fever. You have increasing pain in your abdomen that is not relieved with medicine. These symptoms may be an emergency. Get help right away. Call 911. Do not wait to see if the symptoms will go away. Do not drive yourself to the hospital. Summary After the procedure, it is common to have a small amount of blood in your stool. You may also have mild cramping and bloating of your abdomen. If you were given a sedative during the procedure, it can affect you for several hours. Do not drive or operate machinery until your health care provider says that it is safe. Get help right away if you have a lot of blood in your stool, nausea or vomiting, a fever, or increased pain in your abdomen. This information is not intended to replace advice given to you by your health care provider. Make sure you discuss any questions you have with your health care provider. Document Revised: 12/02/2020 Document Reviewed: 12/02/2020 Elsevier Patient Education  [place] After The following information offers guidance on how to care for yourself after your procedure. Your health care provider may also give you more specific instructions. If you have problems or questions, contact your health care provider. What can I expect after the procedure? After the procedure, it is common to have: Tiredness. Little or no memory about what happened during or after the procedure. Impaired judgment when it comes to making decisions. Nausea or vomiting. Some trouble with balance. Follow these instructions at home: For the time period you were told by your health care provider:  Rest. Do not participate in activities where you could fall or become injured. Do not drive or use machinery. Do  not drink alcohol. Do not take sleeping pills or medicines that cause drowsiness. Do not make important decisions or sign legal documents. Do not take care of children on your own. Medicines Take over-the-counter and prescription medicines only as told by your health care provider. If you were prescribed antibiotics, take them as told by your health care provider. Do not stop using the antibiotic even if you start to feel better. Eating and drinking Follow instructions from your health care provider about what you may eat and drink. Drink enough fluid to keep your urine pale yellow. If you vomit: Drink clear fluids slowly and in small amounts as you are able. Clear fluids include water, ice chips, low-calorie sports drinks, and fruit juice that has water added to it (diluted fruit juice). Eat light and bland foods in small amounts as you are able. These foods include bananas, applesauce, rice, lean meats, toast, and crackers. General instructions  Have a responsible adult stay with you  for the time you are told. It is important to have someone help care for you until you are awake and alert. If you have sleep apnea, surgery and some medicines can increase your risk for breathing problems. Follow instructions from your health care provider about wearing your sleep device: When you are sleeping. This includes during daytime naps. While taking prescription pain medicines, sleeping medicines, or medicines that make you drowsy. Do not use any products that contain nicotine or tobacco. These products include cigarettes, chewing tobacco, and vaping devices, such as e-cigarettes. If you need help quitting, ask your health care provider. Contact a health care provider if: You feel nauseous or vomit every time you eat or drink. You feel light-headed. You are still sleepy or having trouble with balance after 24 hours. You get a rash. You have a fever. You have redness or swelling around the IV  site. Get help right away if: You have trouble breathing. You have new confusion after you get home. These symptoms may be an emergency. Get help right away. Call 911. Do not wait to see if the symptoms will go away. Do not drive yourself to the hospital. This information is not intended to replace advice given to you by your health care provider. Make sure you discuss any questions you have with your health care provider. Document Revised: 09/06/2021 Document Reviewed: 09/06/2021 Elsevier Patient Education  [place].

## 2022-06-17 ENCOUNTER — Encounter (HOSPITAL_COMMUNITY)
Admission: RE | Admit: 2022-06-17 | Discharge: 2022-06-17 | Disposition: A | Discharge status: Home / Self Care | Payer: Medicare HMO | Source: Ambulatory Visit | Attending: Internal Medicine | Admitting: Internal Medicine

## 2022-06-17 ENCOUNTER — Encounter (HOSPITAL_COMMUNITY): Payer: Self-pay

## 2022-06-17 ENCOUNTER — Other Ambulatory Visit (HOSPITAL_COMMUNITY)
Admission: RE | Admit: 2022-06-17 | Discharge: 2022-06-17 | Disposition: A | Discharge status: Home / Self Care | Payer: Medicare HMO | Source: Ambulatory Visit | Attending: Gastroenterology | Admitting: Gastroenterology

## 2022-06-17 VITALS — BP 131/57 | HR 55 | Temp 97.7°F | Resp 18 | Ht 71.0 in | Wt 205.4 lb

## 2022-06-17 DIAGNOSIS — Z01818 Encounter for other preprocedural examination: Secondary | ICD-10-CM | POA: Diagnosis not present

## 2022-06-17 DIAGNOSIS — K7030 Alcoholic cirrhosis of liver without ascites: Secondary | ICD-10-CM

## 2022-06-17 DIAGNOSIS — K7460 Unspecified cirrhosis of liver: Secondary | ICD-10-CM | POA: Diagnosis not present

## 2022-06-17 DIAGNOSIS — R001 Bradycardia, unspecified: Secondary | ICD-10-CM | POA: Diagnosis not present

## 2022-06-17 LAB — CBC WITH DIFFERENTIAL/PLATELET
Abs Immature Granulocytes: 0.02 10*3/uL (ref 0.00–0.07)
Basophils Absolute: 0 10*3/uL (ref 0.0–0.1)
Basophils Relative: 1 %
Eosinophils Absolute: 0.2 10*3/uL (ref 0.0–0.5)
Eosinophils Relative: 4 %
HCT: 33.8 % — ABNORMAL LOW (ref 39.0–52.0)
Hemoglobin: 11.6 g/dL — ABNORMAL LOW (ref 13.0–17.0)
Immature Granulocytes: 0 %
Lymphocytes Relative: 27 %
Lymphs Abs: 1.2 10*3/uL (ref 0.7–4.0)
MCH: 36.9 pg — ABNORMAL HIGH (ref 26.0–34.0)
MCHC: 34.3 g/dL (ref 30.0–36.0)
MCV: 107.6 fL — ABNORMAL HIGH (ref 80.0–100.0)
Monocytes Absolute: 0.7 10*3/uL (ref 0.1–1.0)
Monocytes Relative: 15 %
Neutro Abs: 2.4 10*3/uL (ref 1.7–7.7)
Neutrophils Relative %: 53 %
Platelets: 72 10*3/uL — ABNORMAL LOW (ref 150–400)
RBC: 3.14 MIL/uL — ABNORMAL LOW (ref 4.22–5.81)
RDW: 13.7 % (ref 11.5–15.5)
WBC: 4.5 10*3/uL (ref 4.0–10.5)
nRBC: 0 % (ref 0.0–0.2)

## 2022-06-17 LAB — COMPREHENSIVE METABOLIC PANEL
ALT: 33 U/L (ref 0–44)
AST: 72 U/L — ABNORMAL HIGH (ref 15–41)
Albumin: 2.8 g/dL — ABNORMAL LOW (ref 3.5–5.0)
Alkaline Phosphatase: 94 U/L (ref 38–126)
Anion gap: 6 (ref 5–15)
BUN: 28 mg/dL — ABNORMAL HIGH (ref 8–23)
CO2: 23 mmol/L (ref 22–32)
Calcium: 8.3 mg/dL — ABNORMAL LOW (ref 8.9–10.3)
Chloride: 107 mmol/L (ref 98–111)
Creatinine, Ser: 1.53 mg/dL — ABNORMAL HIGH (ref 0.61–1.24)
GFR, Estimated: 47 mL/min — ABNORMAL LOW (ref >60)
Glucose, Bld: 97 mg/dL (ref 70–99)
Potassium: 3.7 mmol/L (ref 3.5–5.1)
Sodium: 136 mmol/L (ref 135–145)
Total Bilirubin: 2.7 mg/dL — ABNORMAL HIGH (ref 0.3–1.2)
Total Protein: 6.4 g/dL — ABNORMAL LOW (ref 6.5–8.1)

## 2022-06-17 LAB — VITAMIN B12: Vitamin B-12: 843 pg/mL (ref 180–914)

## 2022-06-17 LAB — FOLATE: Folate: 10.6 ng/mL (ref >5.9)

## 2022-06-17 LAB — PROTIME-INR
INR: 1.3 — ABNORMAL HIGH (ref 0.8–1.2)
Prothrombin Time: 16.3 seconds — ABNORMAL HIGH (ref 11.4–15.2)

## 2022-06-20 ENCOUNTER — Telehealth: Payer: Self-pay | Admitting: *Deleted

## 2022-06-20 NOTE — Telephone Encounter (Signed)
[employer] PA: Approved Authorization [id number]  Tracking [id number] DOS:06/22/22-09/20/22

## 2022-06-21 NOTE — Progress Notes (Signed)
Abnormal labs PT/INR slightly elevated and Platelets are at  72 which is not much different per previous labs.  Dr Jenetta Downer notified and is okay to proceed.

## 2022-06-22 ENCOUNTER — Ambulatory Visit (HOSPITAL_COMMUNITY): Payer: Medicare HMO | Admitting: Anesthesiology

## 2022-06-22 ENCOUNTER — Encounter (HOSPITAL_COMMUNITY)
Admission: RE | Disposition: A | Discharge status: Home / Self Care | Payer: Self-pay | Source: Ambulatory Visit | Attending: Internal Medicine

## 2022-06-22 ENCOUNTER — Ambulatory Visit (HOSPITAL_COMMUNITY)
Admission: RE | Admit: 2022-06-22 | Discharge: 2022-06-22 | Disposition: A | Discharge status: Home / Self Care | Payer: Medicare HMO | Source: Ambulatory Visit | Attending: Internal Medicine | Admitting: Internal Medicine

## 2022-06-22 ENCOUNTER — Ambulatory Visit (HOSPITAL_BASED_OUTPATIENT_CLINIC_OR_DEPARTMENT_OTHER): Payer: Medicare HMO | Admitting: Anesthesiology

## 2022-06-22 DIAGNOSIS — D123 Benign neoplasm of transverse colon: Secondary | ICD-10-CM | POA: Diagnosis not present

## 2022-06-22 DIAGNOSIS — K7460 Unspecified cirrhosis of liver: Secondary | ICD-10-CM | POA: Insufficient documentation

## 2022-06-22 DIAGNOSIS — Z1211 Encounter for screening for malignant neoplasm of colon: Secondary | ICD-10-CM | POA: Insufficient documentation

## 2022-06-22 DIAGNOSIS — D120 Benign neoplasm of cecum: Secondary | ICD-10-CM | POA: Diagnosis not present

## 2022-06-22 DIAGNOSIS — I10 Essential (primary) hypertension: Secondary | ICD-10-CM

## 2022-06-22 DIAGNOSIS — D125 Benign neoplasm of sigmoid colon: Secondary | ICD-10-CM | POA: Diagnosis not present

## 2022-06-22 DIAGNOSIS — Z09 Encounter for follow-up examination after completed treatment for conditions other than malignant neoplasm: Secondary | ICD-10-CM

## 2022-06-22 DIAGNOSIS — Z87891 Personal history of nicotine dependence: Secondary | ICD-10-CM | POA: Insufficient documentation

## 2022-06-22 DIAGNOSIS — K7030 Alcoholic cirrhosis of liver without ascites: Secondary | ICD-10-CM

## 2022-06-22 DIAGNOSIS — Z86010 Personal history of colonic polyps: Secondary | ICD-10-CM | POA: Diagnosis not present

## 2022-06-22 DIAGNOSIS — K635 Polyp of colon: Secondary | ICD-10-CM | POA: Diagnosis not present

## 2022-06-22 HISTORY — PX: POLYPECTOMY: SHX5525

## 2022-06-22 HISTORY — PX: COLONOSCOPY WITH PROPOFOL: SHX5780

## 2022-06-22 LAB — FOLATE: Folate: 9.3 ng/mL (ref >5.9)

## 2022-06-22 SURGERY — COLONOSCOPY WITH PROPOFOL
Anesthesia: General

## 2022-06-22 MED ORDER — PROPOFOL 10 MG/ML IV BOLUS
INTRAVENOUS | Status: DC | PRN
  Administered 2022-06-22: 80 mg via INTRAVENOUS

## 2022-06-22 MED ORDER — LIDOCAINE HCL (CARDIAC) PF 100 MG/5ML IV SOSY
PREFILLED_SYRINGE | INTRAVENOUS | Status: DC | PRN
  Administered 2022-06-22: 60 mg via INTRATRACHEAL

## 2022-06-22 MED ORDER — PHENYLEPHRINE HCL (PRESSORS) 10 MG/ML IV SOLN
INTRAVENOUS | Status: DC | PRN
  Administered 2022-06-22: 160 ug via INTRAVENOUS

## 2022-06-22 MED ORDER — LACTATED RINGERS IV SOLN: INTRAVENOUS | Status: DC | PRN

## 2022-06-22 MED ORDER — LACTATED RINGERS IV SOLN: INTRAVENOUS | Status: DC

## 2022-06-22 MED ORDER — PROPOFOL 500 MG/50ML IV EMUL
INTRAVENOUS | Status: DC | PRN
  Administered 2022-06-22: 150 ug/kg/min via INTRAVENOUS

## 2022-06-22 NOTE — Anesthesia Postprocedure Evaluation (Signed)
Anesthesia Post Note  Patient: TEJUAN CURLER  Procedure(s) Performed: COLONOSCOPY WITH PROPOFOL POLYPECTOMY  Patient location during evaluation: Phase II Anesthesia Type: General Level of consciousness: awake and alert and oriented Pain management: pain level controlled Vital Signs Assessment: post-procedure vital signs reviewed and stable Respiratory status: spontaneous breathing, nonlabored ventilation and respiratory function stable Cardiovascular status: blood pressure returned to baseline and stable Postop Assessment: no apparent nausea or vomiting Anesthetic complications: no  No notable events documented.   Last Vitals:  Vitals:   06/22/22 1359 06/22/22 1406  BP: (!) 123/51 (!) 127/53  Pulse: 69   Resp: 14   Temp: 36.7 C   SpO2: 98%     Last Pain:  Vitals:   06/22/22 1359  TempSrc: Oral  PainSc: 0-No pain                 Carliyah Cotterman C Camillo Quadros

## 2022-06-22 NOTE — Interval H&P Note (Signed)
History and Physical Interval Note:  06/22/2022 1:27 PM  Mike Moreno  has presented today for surgery, with the diagnosis of history of colon polyps,cirrhosis,constipation.  The various methods of treatment have been discussed with the patient and family. After consideration of risks, benefits and other options for treatment, the patient has consented to  Procedure(s) with comments: COLONOSCOPY WITH PROPOFOL (N/A) - 1:45 pm, pt can't move transportation as a surgical intervention.  The patient's history has been reviewed, patient examined, no change in status, stable for surgery.  I have reviewed the patient's chart and labs.  Questions were answered to the patient's satisfaction.       patient seen in short stay.  No change.  Noted thrombocytopenia.  Patient is here for surveillance colonoscopy due to history of colonic polyps. The risks, benefits, limitations, alternatives and imponderables have been reviewed with the patient. Questions have been answered. All parties are agreeable.    [name]

## 2022-06-22 NOTE — Discharge Instructions (Addendum)
  Colonoscopy Discharge Instructions  Read the instructions outlined below and refer to this sheet in the next few weeks. These discharge instructions provide you with general information on caring for yourself after you leave the hospital. Your doctor may also give you specific instructions. While your treatment has been planned according to the most current medical practices available, unavoidable complications occasionally occur. If you have any problems or questions after discharge, call Dr. Gala Romney at [phone]. ACTIVITY You may resume your regular activity, but move at a slower pace for the next 24 hours.  Take frequent rest periods for the next 24 hours.  Walking will help get rid of the air and reduce the bloated feeling in your belly (abdomen).  No driving for 24 hours (because of the medicine (anesthesia) used during the test).   Do not sign any important legal documents or operate any machinery for 24 hours (because of the anesthesia used during the test).  NUTRITION Drink plenty of fluids.  You may resume your normal diet as instructed by your doctor.  Begin with a light meal and progress to your normal diet. Heavy or fried foods are harder to digest and may make you feel sick to your stomach (nauseated).  Avoid alcoholic beverages for 24 hours or as instructed.  MEDICATIONS You may resume your normal medications unless your doctor tells you otherwise.  WHAT YOU CAN EXPECT TODAY Some feelings of bloating in the abdomen.  Passage of more gas than usual.  Spotting of blood in your stool or on the toilet paper.  IF YOU HAD POLYPS REMOVED DURING THE COLONOSCOPY: No aspirin products for 7 days or as instructed.  No alcohol for 7 days or as instructed.  Eat a soft diet for the next 24 hours.  FINDING OUT THE RESULTS OF YOUR TEST Not all test results are available during your visit. If your test results are not back during the visit, make an appointment with your caregiver to find out the  results. Do not assume everything is normal if you have not heard from your caregiver or the medical facility. It is important for you to follow up on all of your test results.  SEEK IMMEDIATE MEDICAL ATTENTION IF: You have more than a spotting of blood in your stool.  Your belly is swollen (abdominal distention).  You are nauseated or vomiting.  You have a temperature over 101.  You have abdominal pain or discomfort that is severe or gets worse throughout the day.     Multiple polyps removed your colon today  Further recommendations to follow pending review of pathology report   office visit with [place] in 6 months

## 2022-06-22 NOTE — Op Note (Signed)
[facility] Patient Name: Mike Moreno Procedure Date: 06/22/2022 12:03 PM MRN: [id number] Date of Birth: [birth year] Attending MD: Norvel Richards , MD, [id number] CSN: [id number] Age: 76 Admit Type: Inpatient Procedure:                Colonoscopy Indications:              High risk colon cancer surveillance: Personal                            history of colonic polyps Providers:                Norvel Richards, MD, Janeece Riggers, RN, Raphael Gibney, Technician Referring MD:              Medicines:                Propofol per Anesthesia Complications:            No immediate complications. Estimated Blood Loss:     Estimated blood loss was minimal. Estimated blood                            loss was minimal. Procedure:                Pre-Anesthesia Assessment:                           - Prior to the procedure, a History and Physical                            was performed, and patient medications and                            allergies were reviewed. The patient's tolerance of                            previous anesthesia was also reviewed. The risks                            and benefits of the procedure and the sedation                            options and risks were discussed with the patient.                            All questions were answered, and informed consent                            was obtained. Prior Anticoagulants: The patient has                            taken no anticoagulant or antiplatelet agents. ASA  Grade Assessment: III - A patient with severe                            systemic disease. After reviewing the risks and                            benefits, the patient was deemed in satisfactory                            condition to undergo the procedure.                           After obtaining informed consent, the colonoscope                            was passed under direct  vision. Throughout the                            procedure, the patient's blood pressure, pulse, and                            oxygen saturations were monitored continuously. The                            [id number] scope was introduced through the                            anus and advanced to the the cecum, identified by                            appendiceal orifice and ileocecal valve. The                            colonoscopy was performed without difficulty. The                            patient tolerated the procedure well. The quality                            of the bowel preparation was adequate. The                            ileocecal valve, appendiceal orifice, and rectum                            were photographed. The colonoscopy was performed                            without difficulty. The patient tolerated the                            procedure well. Scope In: 1:37:29 PM Scope Out: 1:56:38 PM Scope Withdrawal Time: 0 hours 12 minutes 18 seconds  Total Procedure Duration: 0 hours 19 minutes 9 seconds  Findings:      The perianal and digital rectal examinations were normal. Mildly       congested/friable colon diffusely.      Four sessile polyps were found in the sigmoid colon, mid transverse       colon and cecum. The polyps were 4 to 6 mm in size. These polyps were       removed with a cold snare. Resection and retrieval were complete.       Estimated blood loss was minimal.      The exam was otherwise without abnormality on direct and retroflexion       views. Impression:               - Four 4 to 6 mm polyps in the sigmoid colon, in                            the mid transverse colon and in the cecum, removed                            with a cold snare. Resected and retrieved.                           - The examination was otherwise normal on direct                            and retroflexion views. 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 The following parameters were monitored: oxygen       saturation, heart rate, blood pressure, respiratory rate, EKG, adequacy       of pulmonary ventilation, and response to care. Recommendation:           - Patient has a contact number available for                            emergencies. The signs and symptoms of potential                            delayed complications were discussed with the                            patient. Return to normal activities tomorrow.                            Written discharge instructions were provided to the                            patient.                           -  Advance diet as tolerated.                           - Continue present medications.                           - Repeat colonoscopy date to be determined after                            pending pathology results are reviewed for                            surveillance.                           - Return to GI office in 6 months. Procedure Code(s):        --- Professional ---                           [phone], Colonoscopy, flexible; with removal of                            tumor(s), polyp(s), or other lesion(s) by snare                            technique Diagnosis Code(s):        --- Professional ---                           Z86.010, Personal history of colonic polyps                           D12.5, Benign neoplasm of sigmoid colon                           D12.3, Benign neoplasm of transverse colon (hepatic                            flexure or splenic flexure)                           D12.0, Benign neoplasm of cecum CPT copyright 2022 American [employer] Association. All rights reserved. The codes documented in this report are preliminary and upon coder review may   be revised to meet current compliance requirements. Cristopher Estimable. Koji Niehoff, MD Norvel Richards, MD 06/22/2022 2:07:18 PM This report has been signed electronically. Number of Addenda: 0

## 2022-06-22 NOTE — Anesthesia Preprocedure Evaluation (Addendum)
Anesthesia Evaluation  Patient identified by MRN, date of birth, ID band Patient awake    Reviewed: Allergy & Precautions, H&P , NPO status , Patient's Chart, lab work & pertinent test results, reviewed documented beta blocker date and time   Airway Mallampati: II  TM Distance: >3 FB Neck ROM: Full    Dental  (+) Dental Advisory Given, Missing, Chipped   Pulmonary former smoker   Pulmonary exam normal breath sounds clear to auscultation       Cardiovascular Exercise Tolerance: Good hypertension, Pt. on medications and Pt. on home beta blockers Normal cardiovascular exam Rhythm:Regular Rate:Normal  17-Jun-2022 10:18:12 [facility] ROUTINE RECORD [id number] (51 yr) Male Caucasian Vent. rate 53 BPM PR interval 164 ms QRS duration 92 ms QT/QTcB 494/463 ms P-R-T axes 65 68 28 Sinus bradycardia Otherwise normal ECG No previous ECGs available Confirmed by Asencion Noble [phone]) on 06/19/2022 11:22:47 AM   Neuro/Psych negative neurological ROS  negative psych ROS   GI/Hepatic negative GI ROS,,,(+) Cirrhosis     substance abuse  alcohol use  Endo/Other  negative endocrine ROS    Renal/GU negative Renal ROS  negative genitourinary   Musculoskeletal negative musculoskeletal ROS (+)    Abdominal   Peds negative pediatric ROS (+)  Hematology  (+) Blood dyscrasia (thrombocytopenia)   Anesthesia Other Findings On midodrine for low blood pressure  Reproductive/Obstetrics negative OB ROS                             Anesthesia Physical Anesthesia Plan  ASA: 3  Anesthesia Plan: General   Post-op Pain Management: Minimal or no pain anticipated   Induction: Intravenous  PONV Risk Score and Plan: 1 and Propofol infusion  Airway Management Planned: Natural Airway and Nasal Cannula  Additional Equipment:   Intra-op Plan:   Post-operative Plan:   Informed Consent: I have  reviewed the patients History and Physical, chart, labs and discussed the procedure including the risks, benefits and alternatives for the proposed anesthesia with the patient or authorized representative who has indicated his/her understanding and acceptance.     Dental advisory given  Plan Discussed with: CRNA and Surgeon  Anesthesia Plan Comments:        Anesthesia Quick Evaluation

## 2022-06-22 NOTE — Transfer of Care (Signed)
Immediate Anesthesia Transfer of Care Note  Patient: Mike Moreno  Procedure(s) Performed: COLONOSCOPY WITH PROPOFOL POLYPECTOMY  Patient Location: Short Stay  Anesthesia Type:General  Level of Consciousness: awake, alert , and oriented  Airway & Oxygen Therapy: Patient Spontanous Breathing  Post-op Assessment: Report given to RN and Post -op Vital signs reviewed and stable  Post vital signs: Reviewed and stable  Last Vitals:  Vitals Value Taken Time  BP 105/40   Temp 98   Pulse 65   Resp 16   SpO2 98     Last Pain:  Vitals:   06/22/22 1359  TempSrc: Oral  PainSc: 0-No pain         Complications: No notable events documented.

## 2022-06-23 LAB — SURGICAL PATHOLOGY

## 2022-06-25 ENCOUNTER — Encounter: Payer: Self-pay | Admitting: Internal Medicine

## 2022-06-29 ENCOUNTER — Encounter: Payer: Self-pay | Admitting: Internal Medicine

## 2022-07-01 ENCOUNTER — Encounter (HOSPITAL_COMMUNITY): Payer: Self-pay | Admitting: Internal Medicine

## 2022-08-02 ENCOUNTER — Encounter: Admit: 2022-08-02 | Payer: PRIVATE HEALTH INSURANCE | Attending: Family Medicine | Primary: Family Medicine

## 2022-08-03 MED ORDER — SILDENAFIL 100 MG TABLET
100 | ORAL_TABLET | ORAL | 3 refills | 30.00000 days | Status: AC
Start: 2022-08-03 — End: ?

## 2022-08-05 ENCOUNTER — Encounter
Admit: 2022-08-05 | Payer: PRIVATE HEALTH INSURANCE | Attending: Vascular and Interventional Radiology | Primary: Family Medicine

## 2022-08-05 ENCOUNTER — Encounter: Admit: 2022-08-05 | Payer: PRIVATE HEALTH INSURANCE | Attending: Family Medicine | Primary: Family Medicine

## 2022-08-05 ENCOUNTER — Encounter: Admit: 2022-08-05 | Payer: Medicare (Managed Care) | Attending: Family Medicine | Primary: Family Medicine

## 2022-08-05 MED ORDER — MISCELLANEOUS MEDICAL SUPPLY MISC
1 refills | 16.00000 days | Status: AC
Start: 2022-08-05 — End: ?

## 2022-08-28 NOTE — Progress Notes (Deleted)
GI Office Note    Referring Provider: Toma Deiters, MD Primary Care Physician:  Toma Deiters, MD  Primary Gastroenterologist: Roetta Sessions, MD   Chief Complaint   No chief complaint on file.   History of Present Illness   Mike Moreno is a 77 y.o. male presenting today       Colonoscopy February 2024: -four 4 to 6 mm polyps in the sigmoid colon, mid transverse colon, cecum removed -3 tubular adenomas -Consider colonoscopy in 5 years if overall health permits  Medications   Current Outpatient Medications  Medication Sig Dispense Refill   alfuzosin (UROXATRAL) 10 MG 24 hr tablet Take 10 mg by mouth daily.     Ascorbic Acid (VITAMIN C) 1000 MG tablet Take 1,000 mg by mouth daily.     furosemide (LASIX) 40 MG tablet Take 40 mg by mouth daily.     gabapentin (NEURONTIN) 100 MG capsule Take 100 mg by mouth at bedtime.     LINZESS 290 MCG CAPS capsule Take 290 mcg by mouth daily.     midodrine (PROAMATINE) 5 MG tablet Take 5 mg by mouth 3 (three) times daily.     nitroGLYCERIN (NITROSTAT) 0.4 MG SL tablet Place 0.4 mg under the tongue as needed for chest pain.     propranolol (INDERAL) 10 MG tablet Take 10 mg by mouth 2 (two) times daily.     sodium bicarbonate 650 MG tablet Take 650 mg by mouth 2 (two) times daily.     spironolactone (ALDACTONE) 25 MG tablet Take 25 mg by mouth daily.     No current facility-administered medications for this visit.    Allergies   Allergies as of 08/29/2022   (No Known Allergies)     Past Medical History   Past Medical History:  Diagnosis Date   BPH (benign prostatic hyperplasia)    Cirrhosis (HCC)    HTN (hypertension)    Hyperlipidemia    Neuropathy     Past Surgical History   Past Surgical History:  Procedure Laterality Date   bilateral inguinal hernias     BIOPSY  12/07/2016   Procedure: BIOPSY;  Surgeon: Corbin Ade, MD;  [facility];  Service: Endoscopy;;  ileocecal valve    COLONOSCOPY  08/2011   According to [facility] notes, 2 tubular adenomatous colon polyps removed next surveillance colonoscopy in 5 years   COLONOSCOPY N/A 12/07/2016   Procedure: COLONOSCOPY;  Surgeon: Corbin Ade, MD;  [facility]: [facility];  Service: Endoscopy;  Laterality: N/A;  1030    COLONOSCOPY WITH PROPOFOL N/A 06/22/2022   Procedure: COLONOSCOPY WITH PROPOFOL;  Surgeon: Corbin Ade, MD;  Location: [facility];  Service: Endoscopy;  Laterality: N/A;  1:45 pm, pt can't move transportation   HAND SURGERY     traumatic injury   POLYPECTOMY  12/07/2016   Procedure: POLYPECTOMY;  Surgeon: Corbin Ade, MD;  [facility];  Service: Endoscopy;;  colon   POLYPECTOMY  06/22/2022   Procedure: POLYPECTOMY;  Surgeon: Corbin Ade, MD;  [facility];  Service: Endoscopy;;   UMBILICAL HERNIA REPAIR      Past Family History   Family History  Problem Relation Age of Onset   Cirrhosis Brother    Colon cancer Neg Hx     Past Social History   Social History   Socioeconomic History   Marital status: Single    Spouse name: Not on file  Number of children: Not on file   Years of education: Not on file   Highest education level: Not on file  Occupational History   Not on file  Tobacco Use   Smoking status: Former    Types: Cigarettes, Cigars   Smokeless tobacco: Never   Tobacco comments:    quit 2015  Vaping Use   Vaping Use: Never used  Substance and Sexual Activity   Alcohol use: No    Comment: some etoh 20 years ago   Drug use: No    Comment: not since 2005   Sexual activity: Not Currently  Other Topics Concern   Not on file  Social History Narrative   Not on file   Social Determinants of Health   Financial Resource Strain: Not on file  Food Insecurity: Not on file  Transportation Needs: Not on file  Physical Activity: Not on file  Stress: Not on file  Social Connections: Not on file  Intimate Partner Violence: Not on file     Review of Systems   General: Negative for anorexia, weight loss, fever, chills, fatigue, weakness. ENT: Negative for hoarseness, difficulty swallowing , nasal congestion. CV: Negative for chest pain, angina, palpitations, dyspnea on exertion, peripheral edema.  Respiratory: Negative for dyspnea at rest, dyspnea on exertion, cough, sputum, wheezing.  GI: See history of present illness. GU:  Negative for dysuria, hematuria, urinary incontinence, urinary frequency, nocturnal urination.  Endo: Negative for unusual weight change.     Physical Exam   There were no vitals taken for this visit.   General: Well-nourished, well-developed in no acute distress.  Eyes: No icterus. Mouth: Oropharyngeal mucosa moist and pink , no lesions erythema or exudate. Lungs: Clear to auscultation bilaterally.  Heart: Regular rate and rhythm, no murmurs rubs or gallops.  Abdomen: Bowel sounds are normal, nontender, nondistended, no hepatosplenomegaly or masses,  no abdominal bruits or hernia , no rebound or guarding.  Rectal: ***  Extremities: No lower extremity edema. No clubbing or deformities. Neuro: Alert and oriented x 4   Skin: Warm and dry, no jaundice.   Psych: Alert and cooperative, normal mood and affect.  Labs   *** Imaging Studies   No results found.  Assessment       PLAN   ***   Leanna Battles. Melvyn Neth, MHS, PA-C [facility]

## 2022-08-29 ENCOUNTER — Ambulatory Visit: Payer: Non-veteran care | Admitting: Gastroenterology

## 2022-08-29 ENCOUNTER — Encounter: Payer: Self-pay | Admitting: Gastroenterology

## 2022-08-29 DIAGNOSIS — Z6829 Body mass index (BMI) 29.0-29.9, adult: Secondary | ICD-10-CM | POA: Diagnosis not present

## 2022-08-29 DIAGNOSIS — N182 Chronic kidney disease, stage 2 (mild): Secondary | ICD-10-CM | POA: Diagnosis not present

## 2022-08-29 DIAGNOSIS — K5902 Outlet dysfunction constipation: Secondary | ICD-10-CM | POA: Diagnosis not present

## 2022-08-29 DIAGNOSIS — G621 Alcoholic polyneuropathy: Secondary | ICD-10-CM | POA: Diagnosis not present

## 2022-08-29 DIAGNOSIS — I5032 Chronic diastolic (congestive) heart failure: Secondary | ICD-10-CM | POA: Diagnosis not present

## 2022-08-29 DIAGNOSIS — N400 Enlarged prostate without lower urinary tract symptoms: Secondary | ICD-10-CM | POA: Diagnosis not present

## 2022-08-29 DIAGNOSIS — Z0000 Encounter for general adult medical examination without abnormal findings: Secondary | ICD-10-CM | POA: Diagnosis not present

## 2022-09-02 ENCOUNTER — Ambulatory Visit: Payer: Non-veteran care | Admitting: Gastroenterology

## 2022-09-07 IMAGING — DX DG KNEE COMPLETE 4+V*R*
4 series · 4 of 4 positions shown · non-contrast
Comparison: None.

CLINICAL DATA: Fall over a month ago.  Right knee pain.

EXAM:
RIGHT KNEE - COMPLETE 4+ VIEW

[knee mlo (1 of 3)]
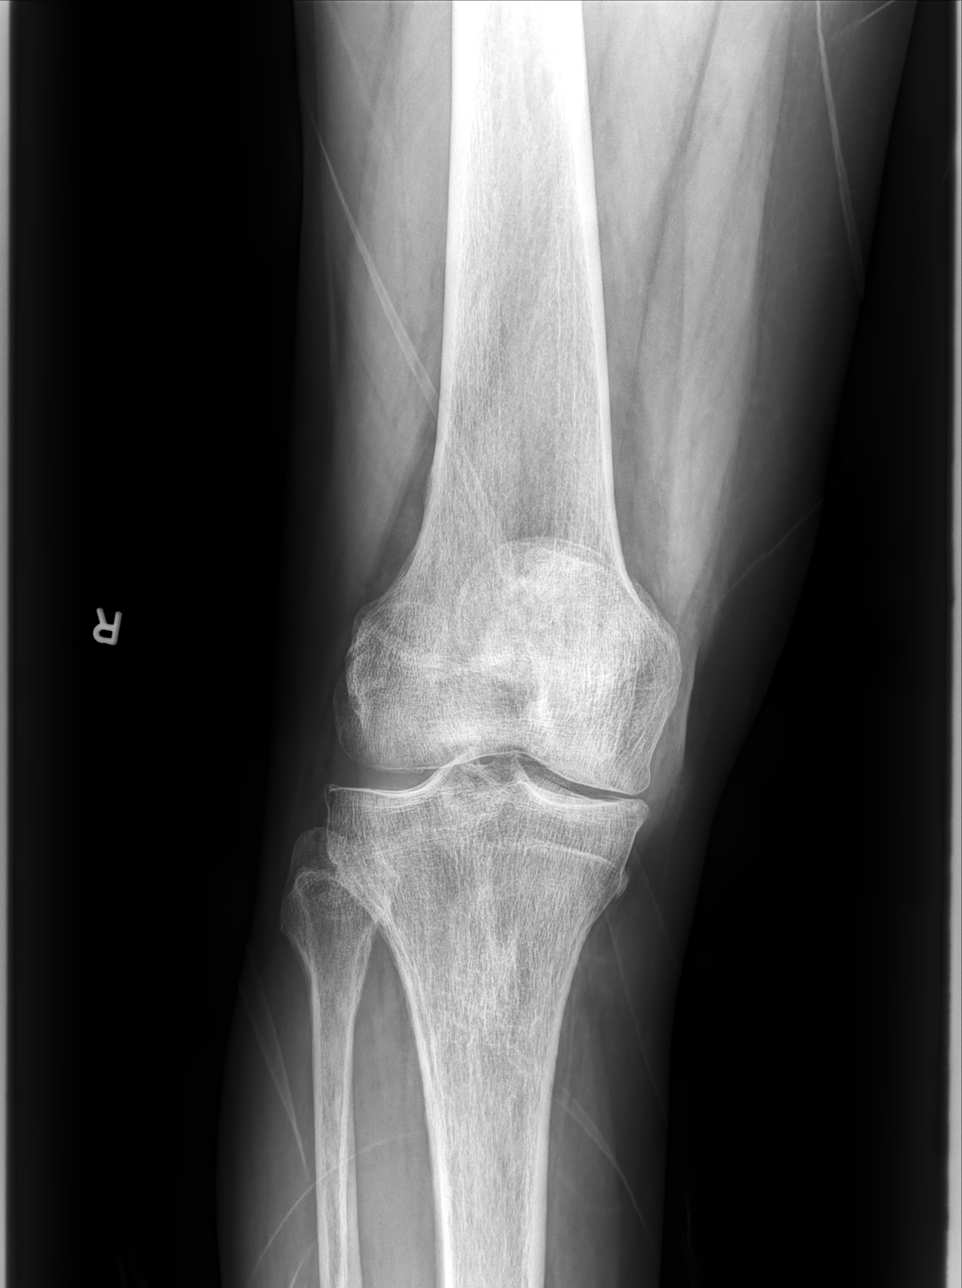

[knee mlo (2 of 3)]
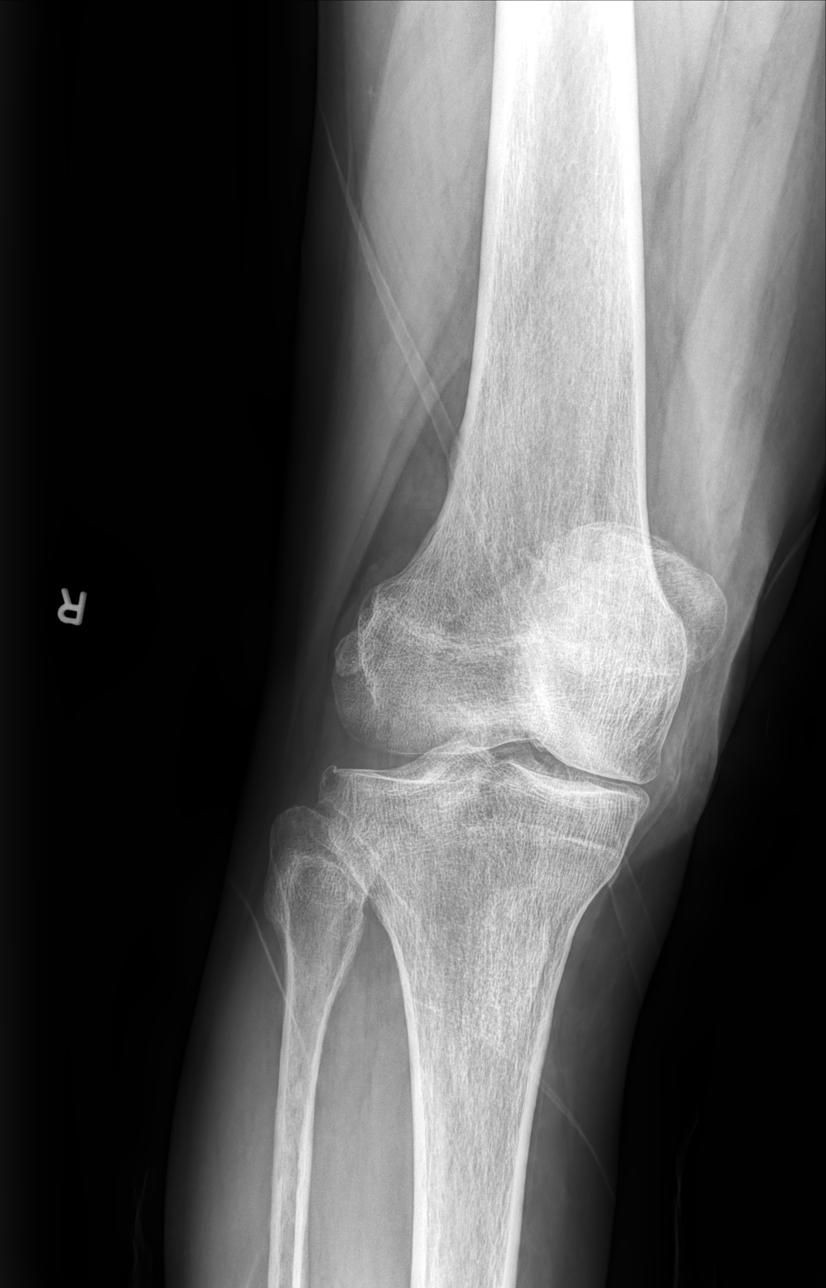

[knee mlo (3 of 3)]
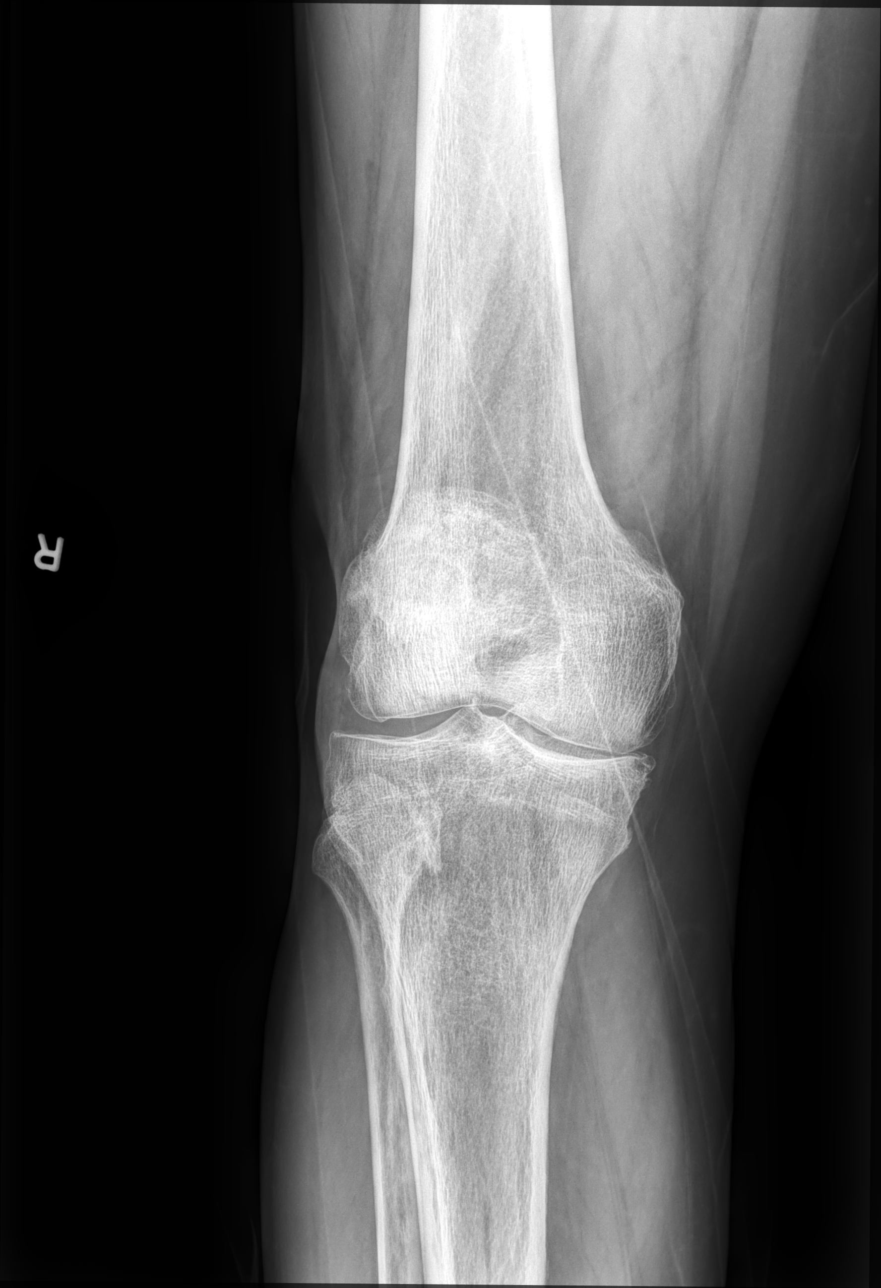

[knee lat]
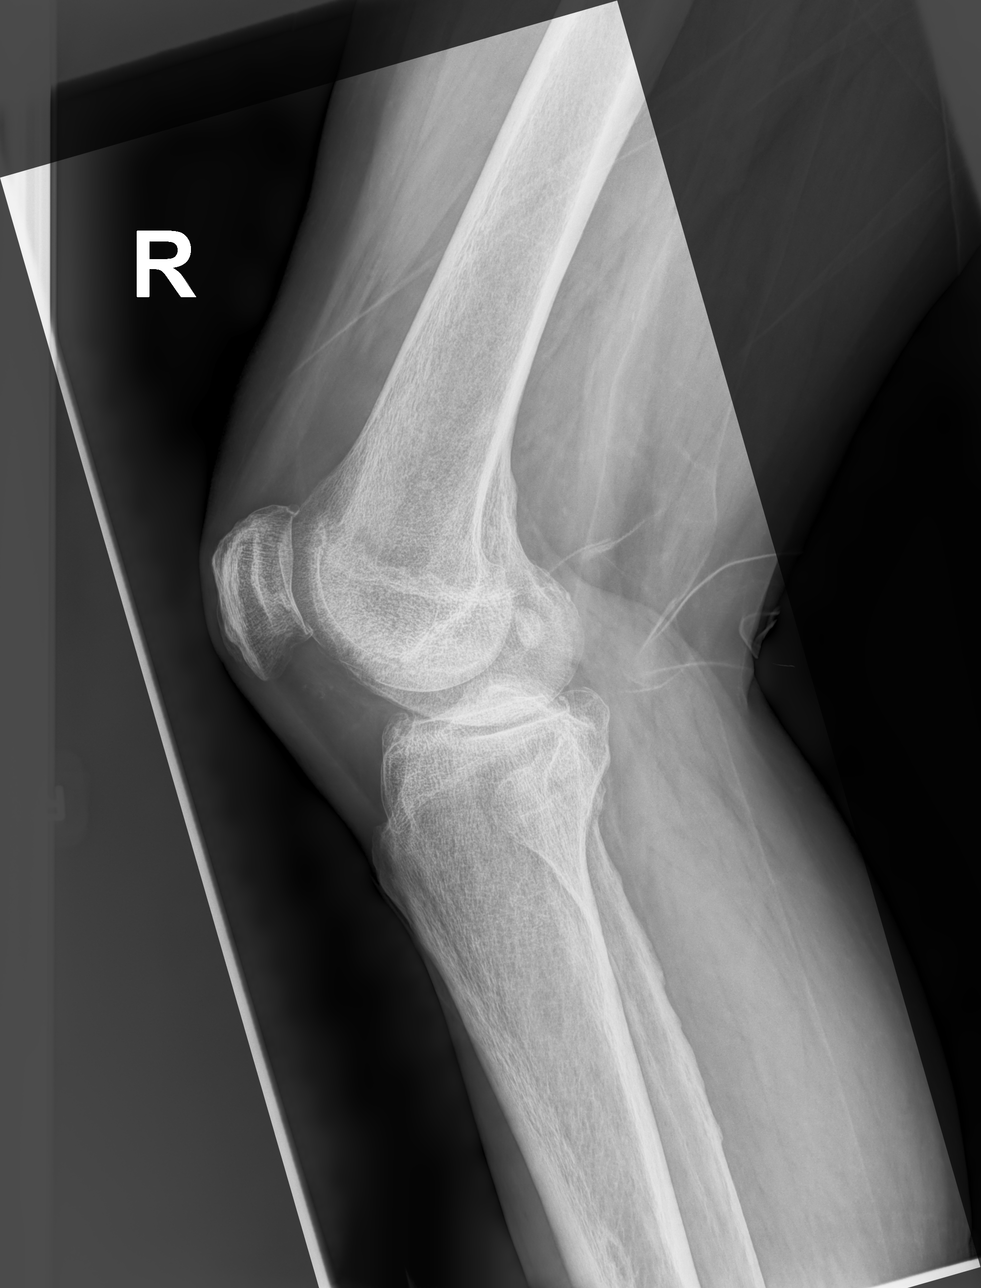

[4 of 4 positions shown; findings below may reference images not displayed]

FINDINGS: No evidence of fracture, dislocation, or joint effusion. Mild
marginal spurring with medial compartment narrowing. Subjective
osteopenia.
IMPRESSION: 1. No acute finding.
2. Degenerative spurring with medial compartment narrowing

## 2022-09-21 ENCOUNTER — Encounter: Admit: 2022-09-21 | Payer: Medicare (Managed Care) | Attending: Family Medicine | Primary: Family Medicine

## 2022-11-24 ENCOUNTER — Telehealth: Admit: 2022-11-24 | Payer: PRIVATE HEALTH INSURANCE | Attending: Family Medicine | Primary: Family Medicine

## 2022-11-26 ENCOUNTER — Encounter: Admit: 2022-11-26 | Payer: PRIVATE HEALTH INSURANCE | Primary: Family Medicine

## 2022-11-26 ENCOUNTER — Inpatient Hospital Stay: Admit: 2022-11-26 | Discharge: 2022-11-26 | Payer: PRIVATE HEALTH INSURANCE

## 2022-11-26 ENCOUNTER — Emergency Department: Admit: 2022-11-26 | Payer: PRIVATE HEALTH INSURANCE | Primary: Family Medicine

## 2022-11-26 DIAGNOSIS — Z87442 Personal history of urinary calculi: Secondary | ICD-10-CM

## 2022-11-26 DIAGNOSIS — G7100 Muscular dystrophy, unspecified: Secondary | ICD-10-CM

## 2022-11-26 DIAGNOSIS — R2681 Unsteadiness on feet: Secondary | ICD-10-CM

## 2022-11-26 DIAGNOSIS — G629 Polyneuropathy, unspecified: Secondary | ICD-10-CM

## 2022-11-26 DIAGNOSIS — Q2112 Patent foramen ovale: Secondary | ICD-10-CM

## 2022-11-26 DIAGNOSIS — R2241 Localized swelling, mass and lump, right lower limb: Secondary | ICD-10-CM

## 2022-11-26 DIAGNOSIS — Z978 Presence of other specified devices: Secondary | ICD-10-CM

## 2022-11-26 DIAGNOSIS — M545 Low back pain, unspecified: Secondary | ICD-10-CM

## 2022-11-26 DIAGNOSIS — C44212 Basal cell carcinoma of skin of right ear and external auricular canal: Secondary | ICD-10-CM

## 2022-11-26 LAB — BASIC METABOLIC PANEL
BKR ANION GAP: 10 g/dL — ABNORMAL HIGH (ref 7–17)
BKR BLOOD UREA NITROGEN: 21 mg/dL — ABNORMAL LOW (ref 8–23)
BKR BUN / CREAT RATIO: 24.4 % — ABNORMAL HIGH (ref 8.0–23.0)
BKR CALCIUM: 9.1 mg/dL (ref 8.8–10.2)
BKR CHLORIDE: 101 mmol/L (ref 98–107)
BKR CO2: 27 mmol/L (ref 20–30)
BKR CREATININE: 0.86 mg/dL — ABNORMAL HIGH (ref 0.40–1.30)
BKR EGFR, CREATININE (CKD-EPI 2021): >60 mL/min/{1.73_m2} — ABNORMAL LOW (ref >=60–420)
BKR GLUCOSE: 107 mg/dL — ABNORMAL HIGH (ref 70–100)
BKR POTASSIUM: 4.3 mmol/L (ref 3.3–5.3)
BKR SODIUM: 138 mmol/L (ref 136–144)

## 2022-11-26 LAB — CBC WITH AUTO DIFFERENTIAL
BKR WAM ABSOLUTE NRBC (2 DEC): 0 x 1000/ÂµL (ref 0.00–1.00)
BKR WAM HEMATOCRIT (2 DEC): 49.9 % (ref 38.50–50.00)
BKR WAM HEMOGLOBIN: 16.4 g/dL (ref 13.2–17.1)
BKR WAM MCH (PG): 31.8 pg (ref 27.0–33.0)
BKR WAM MCHC: 32.9 g/dL (ref 31.0–36.0)
BKR WAM MCV: 96.9 fL (ref 80.0–100.0)
BKR WAM MPV: 10 fL (ref 8.0–12.0)
BKR WAM NUCLEATED RED BLOOD CELLS: 0 % — ABNORMAL HIGH (ref 0.0–1.0)
BKR WAM PLATELETS: 136 x1000/ÂµL — ABNORMAL LOW (ref 150–420)
BKR WAM RDW-CV: 12.8 % (ref 11.0–15.0)
BKR WAM RED BLOOD CELL COUNT.: 5.15 M/ÂµL (ref 4.00–6.00)
BKR WAM WHITE BLOOD CELL COUNT: 7.6 x1000/ÂµL (ref 4.0–11.0)

## 2022-11-26 LAB — MANUAL DIFFERENTIAL
BKR WAM BANDS (DIFF) (1 DEC): 1 % (ref 0.0–10.0)
BKR WAM BASOPHIL - ABS (DIFF) 2 DEC: 0 x 1000/ÂµL (ref 0.00–1.00)
BKR WAM BASOPHILS (DIFF): 0 % (ref 0.0–1.4)
BKR WAM EOSINOPHILS (DIFF) 2 DEC: 0.08 x 1000/ÂµL (ref 0.00–1.00)
BKR WAM EOSINOPHILS (DIFF): 1 % (ref 0.0–5.0)
BKR WAM LYMPHOCYTE - ABS (DIFF) 2 DEC: 0.69 x 1000/ÂµL (ref 0.60–3.70)
BKR WAM LYMPHOCYTES (DIFF): 9 % — ABNORMAL LOW (ref 17.0–50.0)
BKR WAM MONOCYTE - ABS (DIFF) 2 DEC: 1.07 x 1000/ÂµL — ABNORMAL HIGH (ref 0.00–1.00)
BKR WAM MONOCYTES (DIFF): 14 % — ABNORMAL HIGH (ref 4.0–12.0)
BKR WAM NEUTROPHILS (DIFF): 75 % — ABNORMAL HIGH (ref 39.0–72.0)
BKR WAM NEUTROPHILS - ABS (DIFF) 2 DEC: 5.81 x 1000/ÂµL (ref 2.00–7.60)
BKR WAM NORMAL RBC MORPHOLOGY: NORMAL

## 2022-11-26 MED ORDER — LIDOCAINE 4 % TOPICAL PATCH
4 % | TRANSDERMAL | Status: DC
Start: 2022-11-26 — End: 2022-11-26

## 2022-11-26 MED ORDER — PREDNISONE 20 MG TABLET
20 | Freq: Once | ORAL | Status: CP
Start: 2022-11-26 — End: ?
  Administered 2022-11-26: 16:00:00 20 mg via ORAL

## 2022-11-26 MED ORDER — KETOROLAC 60 MG/2 ML INTRAMUSCULAR SOLUTION
60 | Freq: Once | INTRAVENOUS | Status: CP
Start: 2022-11-26 — End: ?
  Administered 2022-11-26: 17:00:00 60 mL via INTRAVENOUS

## 2022-11-26 MED ORDER — LIDOCAINE 5 % ADHESIVE PATCH
5 | MEDICATED_PATCH | TRANSDERMAL | 1 refills | Status: AC
Start: 2022-11-26 — End: ?

## 2022-11-26 MED ORDER — CYCLOBENZAPRINE 5 MG TABLET
5 | Freq: Once | ORAL | Status: CP
Start: 2022-11-26 — End: ?
  Administered 2022-11-26: 16:00:00 5 mg via ORAL

## 2022-11-26 MED ORDER — CYCLOBENZAPRINE 10 MG TABLET
10 | ORAL_TABLET | Freq: Two times a day (BID) | ORAL | 1 refills | Status: AC | PRN
Start: 2022-11-26 — End: ?

## 2022-11-26 MED ORDER — PREDNISONE 10 MG TABLET
10 | ORAL_TABLET | Freq: Every day | ORAL | 1 refills | Status: AC
Start: 2022-11-26 — End: ?

## 2022-11-26 NOTE — ED Notes
10:57 AM Care transferred and report given to [facility]. KAM

## 2022-11-26 NOTE — ED Provider Notes
Chief Complaint Patient presents with  Back Pain   Pt c/o pain across lower back, L side worse than right and pain radiating down both legs (L>R). Pt has muscular dystrophy, recent changes of brace causing change in gait, may be contributing to pain. Hx of kidney stone but this doesn't feel like that. C/o swelling Right leg a few days ago from injury of new brace. Pain 7/10 HPI/PE:77 year old male with a past medical history of HLD, renal stones, Charcot-Marie-Tooth disorder who presents to the ED with lower back pain. It started about 4 days ago. Patient wears bilateral leg braces for his neuropathy and got new braces that weren't fitting well initially, so was using a walker and hunched over prior to the pain starting and thinks using the walker exacerbated the back pain. It radiates down the legs, left worse than right. No saddle anesthesia, difficulty urinating or having a bowel movement. Last BM today. It is very painful to get from a sitting to a standing position. No abdominal pain, nausea, vomiting, fever, chest pain, shortness of breath. He has been taking some Percocet he had at home w/o much relief. No previous surgeries on his back. History of renal stones but this does not feel the same. He does have a PCP appointment in 2 days but came to the ED for pain relief. MDM:MDM: consider spasm vs MSK strain vs lower concern for compression fracture vs epidural abscess vs renal stone Plan:CBCBMPToradol 15mg  IVLidoderm patch Flexeril 10mg  Prednisone 40mg  XR lumbar spine Results:CBC, BMP WNLXR lumbar spine: FINDINGS:Five non-rib-bearing, lumbar-type vertebra are visualized.There is mild retrolisthesis of L1 relative to L2, retrolisthesis of L3 relative to L4, anterolisthesis of L4 on L5, and anterolisthesis of L5 relative to S1. This is likely on degenerative basis.Vertebral body heights are maintained. No evidence of fracture.There are multilevel degenerative changes, as evidenced by endplate sclerosis, disc space narrowing, facet arthropathy, and marginal osteophyte formation.The sacroiliac joints are unremarkable. Bilateral total hip arthroplasty.Calcification of the abdominal aorta.Dispo: Discharge to home. Patient reports improvement in pain, still has some pain but able to ambulate with the walker much better. He will follow up with his PCP in 2 days. He will do Naproxen at home, stop the Percocet, and start Flexeril and prednisone, prescriptions sent to pharmacy. Instructed patient he can always return for any new or worsening symptoms. Patient and wife demonstrated understanding of all discharge instructions. Dr. Val Eagle available for consultation. Mellody Dance, PA-CPlease see Amion for [facility]  Physical ExamED Triage Vitals [11/26/22 1014]BP: (!) 142/88Pulse: 80Pulse from  O2 sat: n/aResp: 16Temp: 97.2 ?F (36.2 ?C)Temp src: TemporalSpO2: 95 % BP (!) 147/73  - Pulse 83  - Temp 98.8 ?F (37.1 ?C) (Temporal)  - Resp 17  - Ht 6' (1.829 m)  - Wt 99.8 kg  - SpO2 94%  - BMI 29.84 kg/m? Physical ExamVitals reviewed. Constitutional:     Appearance: Normal appearance. He is not toxic-appearing. HENT:    Head: Normocephalic. Eyes:    Conjunctiva/sclera: Conjunctivae normal. Cardiovascular:    Rate and Rhythm: Normal rate. Pulmonary:    Effort: Pulmonary effort is normal. No respiratory distress.    Breath sounds: No wheezing. Abdominal:    General: There is no distension.    Palpations: Abdomen is soft.    Tenderness: There is no abdominal tenderness. Musculoskeletal:       General: Normal range of motion.    Comments: Diffuse tenderness to lower paraspinal muscles bilaterally Patient reports pain with getting from a lying to  a sitting position +braces to bilateral lower extremities  Skin:   General: Skin is warm. Neurological: General: No focal deficit present.    Mental Status: He is alert.  ProceduresAttestation/Critical CareClinical Impressions as of 11/26/22 1422 Acute bilateral low back pain, unspecified whether sciatica present  ED DispositionDischarge Mellody Dance, PA08/03/24 1422 Mellody Dance, PA08/03/24 1607

## 2022-11-26 NOTE — Discharge Instructions
Please continue to take your home naproxen dose daily. I would avoid the Percocet (oxycodone/acetaminophen). You can also start taking Flexeril 10mg  up (a muscle relaxant) up to twice per day. Start taking prednisone 40mg  daily for the next 4 days as well. Follow up with your primary care provider on Monday for a re-check. You can always return for ANY new or worsening symptoms.

## 2022-11-26 NOTE — ED Notes
1:03 PM Pt PIV, labs obtained and pt allergies were reviewed.  Pt medicated as per MAR. 1:18 PM  Pt to sitting position with assist.  Pain 10/10.  Reassess in thirty minutes.  Wife at bedside.2:03 PM  Pt sitting up, eating [employer].  Feels much better, as long as he does not move.2:25 PM  Pt PIV d/c'd.  Pt discharged.  Stated understanding of instructions and follow up.  Pt left ED ambulatory and in NAD.

## 2022-11-26 NOTE — ED Notes
11:00 AM Verbal report received from off-going RN, [employer]. This RN assumed care of PT at this time.11:08 AM  Pt on stretcher, call bell in hand.  Pt in NAD.  Wife at bedside.  Awaiting provider.11:43 AM  Pt to xray on stretcher.

## 2022-11-28 ENCOUNTER — Encounter: Admit: 2022-11-28 | Payer: Medicare (Managed Care) | Attending: Family Medicine | Primary: Family Medicine

## 2022-11-28 DIAGNOSIS — N1831 Chronic kidney disease, stage 3a: Secondary | ICD-10-CM | POA: Diagnosis not present

## 2022-11-28 DIAGNOSIS — Z6829 Body mass index (BMI) 29.0-29.9, adult: Secondary | ICD-10-CM | POA: Diagnosis not present

## 2022-11-28 DIAGNOSIS — G621 Alcoholic polyneuropathy: Secondary | ICD-10-CM | POA: Diagnosis not present

## 2022-11-28 DIAGNOSIS — I5032 Chronic diastolic (congestive) heart failure: Secondary | ICD-10-CM | POA: Diagnosis not present

## 2022-11-28 DIAGNOSIS — N400 Enlarged prostate without lower urinary tract symptoms: Secondary | ICD-10-CM | POA: Diagnosis not present

## 2022-11-28 DIAGNOSIS — Z0000 Encounter for general adult medical examination without abnormal findings: Secondary | ICD-10-CM | POA: Diagnosis not present

## 2022-11-28 DIAGNOSIS — K5902 Outlet dysfunction constipation: Secondary | ICD-10-CM | POA: Diagnosis not present

## 2022-11-28 MED ORDER — CYCLOBENZAPRINE 10 MG TABLET
10 | ORAL_TABLET | Freq: Three times a day (TID) | ORAL | 1 refills | Status: SS | PRN
Start: 2022-11-28 — End: 2022-12-13

## 2022-11-28 MED ORDER — PREDNISONE 10 MG TABLET
10 | ORAL_TABLET | ORAL | 1 refills | 12.00000 days | Status: AC
Start: 2022-11-28 — End: 2023-02-15

## 2022-12-05 DIAGNOSIS — Z13820 Encounter for screening for osteoporosis: Secondary | ICD-10-CM | POA: Diagnosis not present

## 2022-12-05 DIAGNOSIS — M810 Age-related osteoporosis without current pathological fracture: Secondary | ICD-10-CM | POA: Diagnosis not present

## 2022-12-05 MED ORDER — KETOROLAC 60 MG/2 ML INTRAMUSCULAR SOLUTION
60 | Freq: Once | INTRAMUSCULAR | Status: CP
Start: 2022-12-05 — End: ?
  Administered 2022-11-28: 09:00:00 60 mL via INTRAMUSCULAR

## 2022-12-07 ENCOUNTER — Telehealth: Admit: 2022-12-07 | Payer: PRIVATE HEALTH INSURANCE | Attending: Family Medicine | Primary: Family Medicine

## 2022-12-07 ENCOUNTER — Emergency Department: Admit: 2022-12-07 | Payer: PRIVATE HEALTH INSURANCE | Primary: Family Medicine

## 2022-12-07 ENCOUNTER — Inpatient Hospital Stay
Admit: 2022-12-07 | Discharge: 2022-12-13 | Payer: PRIVATE HEALTH INSURANCE | Attending: Internal Medicine | Admitting: Internal Medicine

## 2022-12-07 ENCOUNTER — Encounter: Admit: 2022-12-07 | Payer: PRIVATE HEALTH INSURANCE | Primary: Family Medicine

## 2022-12-07 DIAGNOSIS — R3129 Other microscopic hematuria: Secondary | ICD-10-CM

## 2022-12-07 DIAGNOSIS — C44212 Basal cell carcinoma of skin of right ear and external auricular canal: Secondary | ICD-10-CM

## 2022-12-07 DIAGNOSIS — Q2112 Patent foramen ovale: Secondary | ICD-10-CM

## 2022-12-07 DIAGNOSIS — G629 Polyneuropathy, unspecified: Secondary | ICD-10-CM

## 2022-12-07 LAB — CBC WITH AUTO DIFFERENTIAL
BKR WAM ABSOLUTE IMMATURE GRANULOCYTES.: 0.33 x 1000/ÂµL — ABNORMAL HIGH (ref 0.00–0.30)
BKR WAM ABSOLUTE LYMPHOCYTE COUNT.: 0.32 x 1000/ÂµL — ABNORMAL LOW (ref 0.60–3.70)
BKR WAM ABSOLUTE NRBC (2 DEC): 0 x 1000/ÂµL (ref 0.00–1.00)
BKR WAM ANC (ABSOLUTE NEUTROPHIL COUNT): 19.03 x 1000/ÂµL — ABNORMAL HIGH (ref 2.00–7.60)
BKR WAM BASOPHIL ABSOLUTE COUNT.: 0.06 x 1000/ÂµL (ref 0.00–1.00)
BKR WAM BASOPHILS: 0.3 % (ref 0.0–1.4)
BKR WAM EOSINOPHIL ABSOLUTE COUNT.: 0.01 x 1000/ÂµL (ref 0.00–1.00)
BKR WAM EOSINOPHILS: 0 % (ref 0.0–5.0)
BKR WAM HEMATOCRIT (2 DEC): 48.8 % (ref 38.50–50.00)
BKR WAM HEMOGLOBIN: 16.3 g/dL (ref 13.2–17.1)
BKR WAM IMMATURE GRANULOCYTES: 1.6 % — ABNORMAL HIGH (ref 0.0–1.0)
BKR WAM LYMPHOCYTES: 1.5 % — ABNORMAL LOW (ref 17.0–50.0)
BKR WAM MCH (PG): 31.2 pg (ref 27.0–33.0)
BKR WAM MCHC: 33.4 g/dL (ref 31.0–36.0)
BKR WAM MCV: 93.3 fL (ref 80.0–100.0)
BKR WAM MONOCYTE ABSOLUTE COUNT.: 1.13 x 1000/ÂµL — ABNORMAL HIGH (ref 0.00–1.00)
BKR WAM MONOCYTES: 5.4 % (ref 4.0–12.0)
BKR WAM MPV: 9.2 fL (ref 8.0–12.0)
BKR WAM NEUTROPHILS: 91.2 % — ABNORMAL HIGH (ref 39.0–72.0)
BKR WAM NUCLEATED RED BLOOD CELLS: 0 % (ref 0.0–1.0)
BKR WAM PLATELETS: 195 x1000/ÂµL (ref 150–420)
BKR WAM RDW-CV: 12.7 % (ref 11.0–15.0)
BKR WAM RED BLOOD CELL COUNT.: 5.23 M/ÂµL (ref 4.00–6.00)
BKR WAM WHITE BLOOD CELL COUNT: 20.9 x1000/ÂµL — ABNORMAL HIGH (ref 4.0–11.0)

## 2022-12-07 LAB — URINE MICROSCOPIC     (BH GH LMW YH): BKR RBC/HPF: >30 /HPF — AB (ref 0–2)

## 2022-12-07 LAB — BASIC METABOLIC PANEL
BKR ANION GAP: 9 (ref 7–17)
BKR BLOOD UREA NITROGEN: 23 mg/dL (ref 8–23)
BKR BUN / CREAT RATIO: 28 — ABNORMAL HIGH (ref 8.0–23.0)
BKR CALCIUM: 9.3 mg/dL (ref 8.8–10.2)
BKR CHLORIDE: 97 mmol/L — ABNORMAL LOW (ref 98–107)
BKR CO2: 28 mmol/L (ref 20–30)
BKR CREATININE: 0.82 mg/dL (ref 0.40–1.30)
BKR EGFR, CREATININE (CKD-EPI 2021): >60 mL/min/{1.73_m2} (ref >=60)
BKR GLUCOSE: 116 mg/dL — ABNORMAL HIGH (ref 70–100)
BKR POTASSIUM: 4.2 mmol/L (ref 3.3–5.3)
BKR SODIUM: 134 mmol/L — ABNORMAL LOW (ref 136–144)

## 2022-12-07 LAB — URINALYSIS WITH CULTURE REFLEX      (BH LMW YH)
BKR BILIRUBIN, UA: NEGATIVE
BKR GLUCOSE, UA: NEGATIVE
BKR KETONES, UA: NEGATIVE
BKR LEUKOCYTE ESTERASE, UA: NEGATIVE
BKR NITRITE, UA: NEGATIVE
BKR PH, UA: 6.5 (ref 5.5–7.5)
BKR SPECIFIC GRAVITY, UA: 1.017 (ref 1.005–1.030)
BKR UROBILINOGEN, UA (MG/DL): <2 mg/dL (ref <=2.0)

## 2022-12-07 LAB — UA REFLEX CULTURE

## 2022-12-07 MED ORDER — ACETAMINOPHEN 325 MG TABLET
325 mg | Freq: Once | ORAL | Status: CP
Start: 2022-12-07 — End: ?
  Administered 2022-12-08: 04:00:00 325 mg via ORAL

## 2022-12-07 MED ORDER — ROSUVASTATIN 10 MG TABLET
10 mg | Freq: Every day | ORAL | Status: DC
Start: 2022-12-07 — End: 2022-12-13
  Administered 2022-12-08 – 2022-12-13 (×6): 10 mg via ORAL

## 2022-12-07 MED ORDER — CYCLOBENZAPRINE 10 MG TABLET
10 mg | Freq: Three times a day (TID) | ORAL | Status: DC | PRN
Start: 2022-12-07 — End: 2022-12-13
  Administered 2022-12-07 – 2022-12-11 (×7): 10 mg via ORAL

## 2022-12-07 MED ORDER — LIDOCAINE 4 % TOPICAL PATCH
4 % | TRANSDERMAL | Status: DC
Start: 2022-12-07 — End: 2022-12-13

## 2022-12-07 MED ORDER — KETOROLAC 60 MG/2 ML INTRAMUSCULAR SOLUTION
602 mg/2 mL | Freq: Once | INTRAVENOUS | Status: CP
Start: 2022-12-07 — End: ?
  Administered 2022-12-08: 02:00:00 60 mL via INTRAVENOUS

## 2022-12-07 MED ORDER — ACETAMINOPHEN 325 MG TABLET
325 | Freq: Once | ORAL | Status: CP
Start: 2022-12-07 — End: ?
  Administered 2022-12-07: 16:00:00 325 mg via ORAL

## 2022-12-07 MED ORDER — DIAZEPAM 5 MG TABLET
5 mg | Freq: Once | ORAL | Status: CP | PRN
Start: 2022-12-07 — End: ?
  Administered 2022-12-08: 04:00:00 5 mg via ORAL

## 2022-12-07 MED ORDER — ASPIRIN 81 MG TABLET,DELAYED RELEASE
81 mg | Freq: Every day | ORAL | Status: DC
Start: 2022-12-07 — End: 2022-12-13
  Administered 2022-12-08 – 2022-12-13 (×6): 81 mg via ORAL

## 2022-12-07 MED ORDER — KETOROLAC 60 MG/2 ML INTRAMUSCULAR SOLUTION
60 | Freq: Once | INTRAVENOUS | Status: CP
Start: 2022-12-07 — End: ?
  Administered 2022-12-07: 16:00:00 60 mL via INTRAVENOUS

## 2022-12-07 NOTE — ED Notes
5:21 PM pt received in transfer from [facility]

## 2022-12-07 NOTE — ED Notes
3:29 PM pt awaiting transfer to [facility] for MRI/ spine consult. Pt aware, and agreeable.  Pt does not want to change into hospital gown prior to transport.

## 2022-12-07 NOTE — ED Notes
5:50 PM pt sent from [facility] for 6 wks of back pain. Plan for ED obs for MRI and PT eval6:52 PM MRI screening complete. Lido patch declined, medicated w/ flexeril. Blood and respiratory swab collected7:10 PM pt's wife updated on POC.

## 2022-12-07 NOTE — ED Notes
2:41 PM pt now requesting admission. Provider aware.

## 2022-12-07 NOTE — ED Notes
Per order ambulated patient w/ assistant of walker. Pt unable to walk independently. Pt's pain increase with movement. Return him back to stretcher and he sitting on edge of bed leaning on bedside table.Wife is at bedside. RN aware.

## 2022-12-07 NOTE — ED Notes
10:05 AM presents for eval acute on chronic mid low abd pain with migration to left low back and radiation down both legs. Unable to ambulate.  Arrives via ems laying flat on right side for comfort.  Reports recent ED visit for same without radiation of the pain, this being new symptom.  Side rails up x2, call light within reach.  Wife at bedside.  Awaiting provider eval. 10:40am-Ice bag provided to mid low back.  Pt continues to lay on right side.  Wife at side. Awaiting provider eval. 12:18pm-to [place] scan and return. 1:30pm-report to [employer].

## 2022-12-07 NOTE — ED Notes
1:33 PM pt report received, pt awaiting re eval and results.

## 2022-12-08 ENCOUNTER — Inpatient Hospital Stay: Admit: 2022-12-08 | Payer: PRIVATE HEALTH INSURANCE

## 2022-12-08 DIAGNOSIS — R3129 Other microscopic hematuria: Secondary | ICD-10-CM

## 2022-12-08 LAB — SARS-COV-2 (COVID-19)/INFLUENZA A+B/RSV BY RT-PCR (BH GH LMW YH)
BKR INFLUENZA A: NEGATIVE
BKR INFLUENZA B: NEGATIVE
BKR RESPIRATORY SYNCYTIAL VIRUS: NEGATIVE
BKR SARS-COV-2 RNA (COVID-19) (YH): NEGATIVE

## 2022-12-08 LAB — MRSA SCREEN BY PCR: BKR MRSA COLONIZATION STATUS PCR: NOT DETECTED

## 2022-12-08 LAB — C-REACTIVE PROTEIN     (CRP): BKR C-REACTIVE PROTEIN, HIGH SENSITIVITY: 74.4 mg/L — ABNORMAL HIGH

## 2022-12-08 LAB — SEDIMENTATION RATE (ESR): BKR SEDIMENTATION RATE, ERYTHROCYTE: 46 mm/h — ABNORMAL HIGH (ref 0–20)

## 2022-12-08 MED ORDER — ZOLPIDEM 5 MG TABLET
5 mg | Freq: Every evening | ORAL | Status: DC | PRN
Start: 2022-12-08 — End: 2022-12-13
  Administered 2022-12-09 – 2022-12-13 (×3): 5 mg via ORAL

## 2022-12-08 MED ORDER — CETIRIZINE 5 MG TABLET
5 mg | Freq: Every day | ORAL | Status: DC
Start: 2022-12-08 — End: 2022-12-13
  Administered 2022-12-09: 13:00:00 10 mg via ORAL
  Administered 2022-12-09 – 2022-12-13 (×5): 5 mg via ORAL

## 2022-12-08 MED ORDER — KETOROLAC 15 MG/ML INJECTION SOLUTION
15 | Freq: Once | INTRAVENOUS | Status: CP
Start: 2022-12-08 — End: ?
  Administered 2022-12-08: 10:00:00 15 mL via INTRAVENOUS

## 2022-12-08 MED ORDER — VANCOMYCIN MAR LEVEL
Freq: Once | INTRAVENOUS | Status: CP
Start: 2022-12-08 — End: ?

## 2022-12-08 MED ORDER — PERFLUTREN LIPID MICROSPHERES 1.3 ML/10 ML NS INJ (IN
Freq: Once | INTRAVENOUS | Status: CP | PRN
Start: 2022-12-08 — End: ?
  Administered 2022-12-08: 20:00:00 10.000 mL via INTRAVENOUS

## 2022-12-08 MED ORDER — VANCOMYCIN IVPB (1.25 G IN 250ML NS)
Freq: Two times a day (BID) | INTRAVENOUS | Status: DC
Start: 2022-12-08 — End: 2022-12-13
  Administered 2022-12-08 – 2022-12-13 (×11): 250.000 mL/h via INTRAVENOUS

## 2022-12-08 MED ORDER — CEFTRIAXONE IV PUSH 2000 MG VIAL & NS (ADULTS)
INTRAVENOUS | Status: DC
Start: 2022-12-08 — End: 2022-12-13
  Administered 2022-12-09 – 2022-12-13 (×5): 20.000 mL via INTRAVENOUS

## 2022-12-08 MED ORDER — GADOTERATE MEGLUMINE 0.5 MMOL/ML (376.9 MG/ML) INTRAVENOUS SOLUTION
0.5 | INTRAVENOUS | Status: CP
  Administered 2022-12-08: 06:00:00 0.5 mL via INTRAVENOUS

## 2022-12-08 MED ORDER — METRONIDAZOLE 500 MG/100 ML IN SODIUM CHLOR(ISO) INTRAVENOUS PIGGYBACK
500 | Freq: Three times a day (TID) | INTRAVENOUS | Status: DC
Start: 2022-12-08 — End: 2022-12-09
  Administered 2022-12-08 (×3): 500 mL/h via INTRAVENOUS

## 2022-12-08 MED ORDER — ENOXAPARIN 40 MG/0.4 ML SUBCUTANEOUS SYRINGE
400.4 mg/0.4 mL | SUBCUTANEOUS | Status: DC
Start: 2022-12-08 — End: 2022-12-13
  Administered 2022-12-09 – 2022-12-13 (×5): 40 mg/0.4 mL via SUBCUTANEOUS

## 2022-12-08 MED ORDER — DIAZEPAM 5 MG TABLET
5 | Freq: Once | ORAL | Status: CP | PRN
Start: 2022-12-08 — End: ?
  Administered 2022-12-09: 07:00:00 5 mg via ORAL

## 2022-12-08 MED ORDER — KETOROLAC 30 MG/ML (1 ML) INJECTION SOLUTION
30 | Freq: Four times a day (QID) | INTRAVENOUS | Status: DC | PRN
Start: 2022-12-08 — End: 2022-12-12
  Administered 2022-12-08 – 2022-12-10 (×4): 30 mL via INTRAVENOUS

## 2022-12-08 MED ORDER — SODIUM CHLORIDE 0.9 % BOLUS (NEW BAG)
0.9 | Freq: Once | INTRAVENOUS | Status: CP
Start: 2022-12-08 — End: ?
  Administered 2022-12-08: 12:00:00 0.9 mL/h via INTRAVENOUS

## 2022-12-08 MED ORDER — ACETAMINOPHEN 325 MG TABLET
325 | Freq: Once | ORAL | Status: CP
Start: 2022-12-08 — End: ?
  Administered 2022-12-08: 12:00:00 325 mg via ORAL

## 2022-12-08 MED ORDER — CEFTRIAXONE IV PUSH 2000 MG VIAL & NS (ADULTS)
Freq: Once | INTRAVENOUS | Status: CP
Start: 2022-12-08 — End: ?
  Administered 2022-12-08: 07:00:00 20.000 mL via INTRAVENOUS

## 2022-12-08 NOTE — ED Notes
8:17 AM Spoke with Sharman Crate, pts wife, and gave her update.  She is on the way to the hospital

## 2022-12-08 NOTE — ED Provider Notes
 Chief Complaint Patient presents with  Back Pain   Came in by EMS from home with low back pain, seen here last week for same, pt currently on PT.  HPI/PE:Patient is a 77 y.o male pmhx of basal cell carcinoma s/p treatment, peripheral neuropathy with bilateral leg braces, patent foramen ovale presenting with low back pain onset this morning with chills and difficulty ambulating. Patient states he was seen on 8/10 with similar symptoms, was given pain medicine, flexeril and started on a prednisone taper that he has been taking. He reports starting physical therapy yesterday, did not provide significant relief. He reports waking up this morning and having sudden excruciating and worsening pain that radiates to both legs and upper buttocks that he describes as sciatica. He states he did not have sciatica when he first presented on 8/10. He denies any urinary symptoms, no bladder/bowel incontinence, saddle anesthesia. He ambulates with a walker at baseline but was unable to ambulate this morning due to pain On exam, pt is hemodynamically stable, in no acute distress, laying on right side for comfort, +lumbar paraspinal tenderness without midline or point tenderness, +bilateral upper glute tenderness radiating down leg, nv in tact , hip flexion and extension in tact Plan:Considered infectious etiology vs considered uti vs considered compression fx vs considered acute on chronic back pain vs considered sciatica vs considered though lower suspicion for acute cord, abscess given clinical exam Will repeat labs, check UA and obtain a [place] L spine for further evaluation & plan for ambulation trial ED Course and Results: -WBC 20.0  -No AKI -UA 2+ blood, 30 rbcs, negative leukocytes, nitrites  Lumbar spine: 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 No acute fracture or subluxation.Pt ambulated with rolling walker, however, reports persistent pain. !!! Please order MRI Lumbar spine wo contrast upon arrival to [facility] ED !!!! Discussed results with patient and family at bedside. Shared decision making regarding discharge vs observation for further imaging, PT eval. Patient and family spoke with their PMD who agrees with observation for further testing and management. ADMISSION TO ED OBSERVATIONAdmit to Observation Status: Obs date: N/A6' 1 (1.854 m)99.8 kgIndication for observation: Diagnostic testing (MRI, 6hr  etc)In addition to serial exams and monitoring of hemodynamics, other anticipated interventions include: Diagnostic testingRelevant home medications to be ordered by me and patient handoff given per protocol Discharge criteria: MRI L spine, pain control, PT eval, spine referral. Patient was signed out to [facility] ED Obs APP for further care upon arrival. Discussed with Dr.Zhang.   Physical ExamED Triage Vitals [12/07/22 0953]BP: 121/61Pulse: (!) 53Pulse from  O2 sat: n/aResp: 17Temp: 98.5 ?F (36.9 ?C)Temp src: TemporalSpO2: 94 % BP (!) 152/92  - Pulse 65  - Temp 97.5 ?F (36.4 ?C) (Oral)  - Resp 17  - Ht 6' 1 (1.854 m)  - Wt 99.8 kg  - SpO2 96%  - BMI 29.03 kg/m? Physical ExamVitals and nursing note reviewed.  ProceduresAttestation/Critical CarePatient Reevaluation: I discussed the case with supervisee, agree with supervisee E/M note with corrections /additions as noted I did not personally see patient.Admitted to medicine for management of epidural abscess, discitis.Abx.Spine following.Nada Libman Sather_______Pt was turned over to me, 0700, having already been admitted, pending ready bed. I was available for any questions/concerns during my shift. I did not personally eval this pt. Mervyn Gay MD  9:36 AMClinical Impressions as of 12/08/22 1411 Back pain, unspecified back location, unspecified back pain laterality, unspecified chronicity Microscopic hematuria Epidural abscess  ED DispositionAdmit Berton Lan, PA08/14/24 1458 Berton Lan, PA08/14/24 1507 Berton Lan, PA08/14/24 1636 Inez Pilgrim, MD08/15/24 0540 Mervyn Gay, MD08/15/24 0936 Berton Lan, PA08/15/24 1411

## 2022-12-08 NOTE — ED Notes
10:36 PM Initial contact done. Patient care assumed.

## 2022-12-08 NOTE — Other
[name]  REQUEST  DOC[facility] NOTE-Type of consult: [facility] Interventional Radiology -New Consult: [name] Carlos Caldwell / Location: 9723/9723-B / Brief Clinical Question: 87V here with spinal epidural abscess; requesting IR biopsy of abscess to guide treatment/Callback Cell Phone: covering provider [phone] / Use .consultnotetemplate or add .consultrecommendation to your consult notes. Remind your attending to use .consultattestation /Please confirm receipt of this message by texting back ?OK?-2 [name] Page sent to Interventional Radiology [facility] [phone] at 6:35 PM.-[name] Justice Deeds, PCT8/15/20246:35 [facility] [phone]

## 2022-12-08 NOTE — H&P
[facility] ConsultPatient name:  Isais CochranMRN: [id number] Date of birth: [birth year]Date of admit: 8/14/2024Attending of record for this patient: Otho Najjar*   Provider leaving this note: Nena Jordan, MDReason for evaluation / consult is:  Question of lumbar epidural abscess on MRIHPI:Mr. Chukwu is a 77 y.o. 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  He states that over the past 2 weeks he had back pain as well as occasional sciatica pain that shoots down to posterior aspect of the thigh and occasionally posterior aspect of the calf.  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  Denies any fevers.  He does state that today he did have some chills.Roughly a week ago he was prescribed Medrol Dosepak for the pain states that this did not help at all.Denies any urinary symptoms or bowel incontinence.  Denies saddle anesthesiaLives in [place].  1 flight of stairs .Baseline ambulation: with walker/canePrevious Orthopaedic: Charcot Marie Tooth feetOnc history:  Basal cell carcinomaSmoking/EtOH/Drug Use:  DeniesPMH:Past Medical History: Diagnosis Date  BCC (basal cell carcinoma), ear, right   Patent foramen ovale   Peripheral neuropathy  XBM:WUXL Surgical History: Procedure Laterality Date  COLONOSCOPY  07/2014  JOINT REPLACEMENT    b/l hips  ORTHOPEDIC SURGERY    ROTATOR CUFF REPAIR Bilateral   TOTAL HIP ARTHROPLASTY  2000  TOTAL HIP ARTHROPLASTY  2003  VASCULAR SURGERY    L carotid endarterectomy Home Medications:Current Facility-Administered Medications Medication Dose Route Frequency Provider Last Rate Last Admin  aspirin EC delayed release tablet 81 mg  81 mg Oral Daily Berton Lan, PA      cyclobenzaprine (FLEXERIL) tablet 10 mg  10 mg Oral TID PRN Forschino, Koleen Nimrod, PA-C   10 mg at 12/08/22 0319  diazePAM (VALIUM) tablet 5 mg  5 mg Oral Once PRN Posey Pronto A, PA      lidocaine 4 % topical patch 1 patch  1 patch Transdermal Q24H Forschino, Koleen Nimrod, PA-C      metroNIDAZOLE (FLAGYL) 500 mg/100 mL IVPB 500 mg  500 mg Intravenous Q8H Morris, Thomas A, PA 200 mL/hr at 12/08/22 0313 500 mg at 12/08/22 0313  rosuvastatin (CRESTOR) tablet 10 mg  10 mg Oral Daily Berton Lan, PA      vancomycin (VANCOCIN) 1.25 g in sodium chloride 0.9% 250 mL IVPB  1.25 g Intravenous Q12H Wonda Horner, PA     Current Outpatient Medications Medication Sig Dispense Refill  aspirin 81 MG EC tablet Take 1 tablet (81 mg total) by mouth daily.    cholecalciferol (VITAMIN D) 1,000 unit tablet Take 1 tablet (1,000 Units total) by mouth daily.    cyclobenzaprine (FLEXERIL) 10 mg tablet Take 1 tablet (10 mg total) by mouth 3 (three) times daily as needed for muscle spasms for up to 10 days. 30 tablet 0  fluticasone propionate (FLONASE) 50 mcg/actuation nasal spray SHAKE LIQUID AND USE 1 SPRAY IN EACH NOSTRIL DAILY 48 g 2  lidocaine (LIDODERM) 5 % Place 1 patch onto the skin every 24 hours. Remove & Discard patch within 12 hours or as directed by MD 30 patch 0  loratadine (CLARITIN) 10 mg tablet Take 1 tablet (10 mg total) by mouth.    Miscellaneous  Medical Supply Use as directed.  Patient needs customize bilateral AFO braces.Dx G.60.0, R26.9, R23.8 1 each 0  multivitamin capsule Take 1 capsule by mouth daily.    naproxen sodium (ANAPROX) 220 MG tablet Take 1 tablet (220 mg total) by mouth 2 (two) times daily with breakfast and dinner.    potassium citrate (UROCIT-K) 10 mEq (1,080 mg) extended release tablet predniSONE (DELTASONE) 10 mg tablet 3 tabs x 4days, 2 tabs x 4days 1 tabs x 4day, then 0.5 tab x 4 days. Take with food 32 tablet 0  sildenafiL (VIAGRA) 100 mg tablet TAKE 1 TABLET(100 MG) BY MOUTH DAILY AS NEEDED FOR ERECTILE DYSFUNCTION 18 tablet 2  simvastatin (ZOCOR) 10 mg tablet TAKE 1 TABLET(10 MG) BY MOUTH EVERY NIGHT 90 tablet 3  zolpidem (AMBIEN) 10 mg tablet Take 1 tablet (10 mg total) by mouth nightly as needed.. 90 tablet 1 Allergies:Patient has no known allergies.Social History:Social History Socioeconomic History  Marital status: Married Tobacco Use  Smoking status: Never  Smokeless tobacco: Never Substance and Sexual Activity  Alcohol use: No  Drug use: No Physical Exam:Temp:  [97.5 ?F (36.4 ?C)-98.7 ?F (37.1 ?C)] 97.5 ?F (36.4 ?C)Pulse:  [53-90] 63Resp:  [14-18] 14BP: (118-149)/(61-86) 149/78SpO2:  [91 %-96 %] 96 %SpO2:  [91 %-96 %] 96 % (08/15 0252)Awake, alert, NAD.Breathing comfortably on room air WJX:BJYNWGNFA grossly intact m/r/u nerve distributionsepl/fpl/io/opp/we/wf intactRadial pulse palp, fingers wwp OZH:YQMVHQION diminished dp/sp/tn nerve distributions, sensation normal proximal to the mid calfCharcot-Marie-Tooth deformity right greater than left noted with varus angulationta/ehl/fhl/gs intactDP pulses palp, toes wwpSpine nontender to palpation throughout along spinous processes and paraspinal musculature, though he does point to lower lumbar spine area for where he has deeper pain Root Motion RIGHT LEFT C5 Shoulder abduction 5 / 5 5 / 5 C6 Wrist extension 5 / 5 5 / 5 C7 Elbow extensionWrist flexion 5 / 5 5 / 5 C8 Finger flexion 5 / 5 5 / 5 T1 Hand intrinsics 5 / 5 5 / 5 L2-L3 Hip flexion/adduction 5 / 5 5 / 5 L3-4 Knee extension 5 / 5 5 / 5 L4-5 Ankle/Toe dorsiflexion 5 / 5 5 / 5 S1 Ankle plantarflexion 5 / 5 5 / 5 Root Sensation RIGHT LEFT C5 Lateral epicodyle Intact Intact C6 Thumb Intact Intact C7 Middle finger Intact Intact C8 Small finger Intact Intact T1 Medial epicondyle Intact Intact L2 Medial mid-thigh Intact Intact L3 Anterior knee Intact Intact L4 Medial malleolus Diminished Diminished L5 First webspace Diminished Diminished S1 Lateral heel Diminished Diminished  Root Reflex RIGHT LEFT C5 Biceps 2 / 4 2 / 4 C6 Brachioradialis 2 / 4 2 / 4 C7 Triceps 2 / 4 2 / 4 L4 Quadriceps 2 / 4 2 / 4 S1 Gastroc-soleus 2/ 4 2/ 4    Neg Hoffman'sNo ankle clonusNo saddle anesthesiaIntact rectal toneImaging / studies: MRI of the lumbar spine with and without contrast demonstrates bilateral L5-S1 septic facet joint arthritis as well as L5-S1 endplate enhancement suggestive of diskitis.  Possible epidural phlegmon as well as small septated abscesses throughout the lower lumbar spine from L4 to the S2 with severe spinal canal stenosisLabs: Lab Results Component Value Date  CREATININE 0.82 12/07/2022  BUN 23 12/07/2022  NA 134 (L) 12/07/2022  K 4.2 12/07/2022  CL 97 (L) 12/07/2022  CO2 28 12/07/2022 Lab Results Component Value Date  WBC 20.9 (H) 12/07/2022  HGB 16.3 12/07/2022  HCT 48.80 12/07/2022  MCV 93.3 12/07/2022  PLT 195 12/07/2022 No results found for: INR, PROTIMEA/P:  Gauge Skluzacek is a 77 y.o. male with possible epidural abscesses around L4-S2 with severe spinal canal stenosis as well as lower lumbar pain and radiculopathy.  Over does not have any fevers and no red flag symptoms of cauda equina syndrome.  However given elevated white blood cell count 20.9 (possibly due to Medrol Dosepak) and elevated ESR of 46, we will workup for complete spinal imaging.Discussed with Dr. Seward Carol, this may be managed nonoperatively. Recommend admission to Medicine for IV abx and ID consult-Activity:  as tolerated, should ambulate with AFOs-Diet:  ok for diet-PVRs-MRI C and T spine W and WO contrast-Abx: broad spectrum, per primary-Pain control-Bowel Regimen-Home meds when able-PT/OT-Dispo: Nonoperative management, per primaryD/w Dr. Beatrice Lecher, MD8/15/2024

## 2022-12-08 NOTE — ED Notes
11:03 AM report received from RN, care transferred. Pt pending MRI. 11:42 AM pt asking for plan of care,  PA Forschino aware. 11:50 AM PA Forschino at bedside. Floor Handoff Telemetry: 	[]  Yes		[x]  NoCode Status:   [x]  Full		[]  DNR		[]  DNI		Other (specify):Safety Precautions: []  Fall Risk  []  Sitter   []  Restraints	[]  Suicidal	[x]  None	Other (specify):Mentation/Orientation:	 A&O (Self, person, place, time) x    4      	 Disoriented to:                    	 Special Accommodations: []  Hearing impaired   []  Blind  []  Nonverbal  []  Cognitive impairmentOxygenation Upon Admission: []  RA	[]  NC	[]  Venti  []  Simple Mask []  Other	Baseline O2 Status? []  Yes	[]  NoAmbulation: [x]  Independent	[]  Cane   []  Walker	[]  Wheelchair	[]  Bedbound		[]  Hemiplegic	[]  Paraplegic	[]  QuadraplegicEliminiation: [x]  Independent	[]  Commode	[]  Bedpan/Urinal  []  Straight Cath []  Foley cath			[]  Urostomy	[]  Colostomy	Other (specify):Diarrhea/Loose stool : []  1x within 24h  []  2x within 24h  []  3x within 24h  []  None 	C.Diff Order: 	[]  Ordered- needs to be collected             []  Collected-sent to lab             []  Resulted - Negative C.Diff             []  Resulted - Positive C.Diff[]  Not Ordered   []  N/ASkin Alteration: []  Pressure Injury []  Wound [x]  None []  Skin not assessedDiet: [x]  Regular/No order placed	[]  NPO		Other (specify):IV Access: [x]  PIV   []  PICC    []  Port    [] Central line    []  A-line    Other (specify)IVF/GTT Running Upon ED Departure? [x]  No	    []  Yes (specify):Outstanding Meds/Treatments/Tests:Patient Belongings:Are the belongings documented?          []  No	    []  YesIs someone taking belongings home?   []  No     []  Yes  Who? (specify)                                   Jennye Moccasin, RN

## 2022-12-08 NOTE — Other
[facility] ED Observation Progress NoteAdmit to Observation Status: Obs date: N/ACondition:ImprovedVital signs: Normal  Yes BP (!) 89/64  - Pulse 65  - Temp 97.5 ?F (36.4 ?C) (Oral)  - Resp 18  - Ht 6' 1 (1.854 m)  - Wt 99.8 kg  - SpO2 96%  - BMI 29.03 kg/m? Vital signs reviewed per nursing notesGeneral:  Distress: Appears well, no distressHeart:  normal rateLungs:  Clear to auscultation bilaterally.  Normal respiratory effortNeuro: at baselineAbdomen: Soft, nontender YesSkin: Warm and well perfused YesIn short this is a 77 year old male history of bilateral hip replacements, nephrolithiasis, hyperlipidemia, basal cell carcinoma, peripheral neuropathy with bilateral leg braces presenting with worsening lower back pain radiating down both legs for the PACS 2 weeks.  Was seen at [facility] on 08/03 given a Medrol Dosepak however now can not ambulate due to pain.  Denies saddle anesthesia.  Seen at [facility] again today had a [place] L-spine and laboratory workup and plan for MRI lumbar spine and PT evaluation.Workup notable for leukocytosis WBC 20I have reviewed [employer] imaging done at [facility] withSeveral right-sided nonobstructive renal calculi are appreciated, with the largest calculus measuring up to 1.6 cm in the right upper pole. A right upper pole renal cyst is present. Evaluation of the pelvic organs is limited secondary to streak artifact from bilateral hip arthroplasties.  Note is made of extensive atherosclerotic calcifications in the abdominal aorta and common iliac vessels. 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 No acute fracture or subluxation.- patient has RBCs not infected, nonobstructing renal calculi seen on [place] likely etiology RBCs.  Doubt this is etiology of patient's lower mid back painPatient reporting pain improved with Toradol states he usually walks with just leg braces.  Denies any fevers chills, cough, congestion.  Given leukocytosis will add infectious workup including chest x-ray, COVID flu.  Per primary team thought to be due to the Medrol Dosepak.Put in lumbar MRI with and without concern for spinal stenosis, consider though less likely discitis though given leukocytosis will add IV contrastAdditionally order for PTBrianna Arshia Rondon PA-C---------------------------------------------------------------------------1900  Pt was signed out to me   -[employer].  Pending MRIMRI LS: Abnormal signal and enhancement compatible with bilateral L5-S1 septic facet joint arthritis, with mild signal abnormality and enhancement about the L5-S1 endplates also suggestive of discitis. There is prominent circumferential epidural phlegmon and small septated abscesses, in the lower lumbar spine spanning from L4 through S1-S2 levels resulting in severe spinal canal stenosis.  Marland KitchenMarland KitchenMarland KitchenSpine (ortho) consult:  recommend ESR, CRP.  MRI C and T spine.  AbxUnclear source of infection but Pt stated 2 weeks ago had abrasions on legs from leg braces.D/W ED Pharmacist for abx coverage (Ctx, Vanc, Flagyl)Ortho: nonoperative management w abx; recommend admit to medicine.  W ID consult. Pt admitted to medicine--------------------------------------------------------------------------------I discussed the case with supervisee, agree with supervisee E/M note with corrections /additions as noted I did not personally see patient. Radicular back pain pending MRI. Marla Roe Smith Couturier----------------------------------------------------------------------------Response to management: stableObservation course: admit to medicineI discussed the case with supervisee, agree with supervisee E/M note with corrections /additions as noted I did not personally see patientKatja Arelia Longest, PA-C08/14/24 1832 Wonda Horner, PA08/14/24 2316 Couturier, Dillard Essex, MD08/15/24 0006 Wonda Horner, PA08/15/24 0558 Arther Dames, MD08/15/24 0628 Blakely Gluth, Koleen Nimrod, PA-C08/15/24 [phone]

## 2022-12-08 NOTE — Plan of Care
Inpatient Physical Therapy Evaluation IP Adult PT Eval/Treat - 12/08/22 1045    Date of Visit / Treatment  Date of Visit / Treatment 12/08/22   Note Type Evaluation   Progress Report Due 12/22/22   Start Time 1000   End Time 1045   Total Treatment Time 45    General Information  Pertinent History Of Current Problem per chart-  77 y.o. male with a history of bilateral hip replacements, nephrolithiasis, basal cell carcinoma. He  presented with low back pain. For 2 weeks he had back pain as well as occasional sciatica pain that shoots down to posterior aspect of the thigh and occasionally posterior aspect of the calf.    Denies any significant changes in his motors or sensations.   Denies any fevers.But had some chills.    Subjective Agreeable   General Observations Patient sitting up in bed on RA. IV antibiotics in place. Currently in ED. Wife at bedside.   Precautions/Limitations Fall Precautions    Prior Level of Functioning/Social History  Additional Comments Patient lives with wife in a home. Owns a walker and cane. Ambulates with bilateral AFO's.    Vital Signs and Orthostatic Vital Signs  Vital Signs Vital Signs Stable   Vital Signs Free text RA    Pain/Comfort  Location #1 - PreTreatment Rating (Numbers Scale) 5/10   Posttreatment Rating (Numbers Scale) 8/10   Pain Comment (Pre/Post Treatment Pain) Back pain with radicular symtpoms into bilateral LE's. RN aware. Premedicated.    Cognition  Overall Cognitive Status Within Functional Limits   Orientation Level Oriented X4   Level of Consciousness alert    Vision/ Hearing  Vision Assessment Results No vision deficits noted    Range of Motion  Range of Motion Examination LUE ROM was WFL;RUE ROM was WFL;LLE ROM was WFL;RLE ROM was [facility]   Range of Motion Comments Hx of charcot marie tooth bilateral feet. Limtied DF bilaterally. L foot rests with increased inversion Musculoskeletal  LUE Muscle Strength Grading 3-->active movement against gravity   RUE Muscle Strength Grading 3-->active movement against gravity   LLE Muscle Strength Grading 3-->active movement against gravity   RLE Muscle Strength Grading 3-->active movement against gravity    Sensory Assessment  Sensory Assessment Comments Neuropathies bilateral LE's    Skin Assessment  Skin Assessment See Nursing Documentation    Bed Mobility  Bed Mobility Comments Patient declining to mobilize currently due to pain.    Handoff Documentation  Handoff Discussed with nursing;Patient in bed    Activity Tolerance  Activity Tolerance Comments Fair-    PT- AM-PAC - Basic Mobility Screen- How much help from another person do you currently need.....  Turning from your back to your side while in a a flat bed without using rails? 3 - A Little - Requires a little help (supervision, minimal assistance). Can use assistive devices.   Moving from lying on your back to sitting on the side of a flat bed without using bed rails? 3 - A Little - Requires a little help (supervision, minimal assistance). Can use assistive devices.   Moving to and from a bed to a chair (including a wheelchair)? 2 - A Lot - Requires a lot of help (maximum to moderate assistance). Can use assistive devices.   Standing up from a chair using your arms(e.g., wheelchair or bedside chair)? 2 - A Lot - Requires a lot of help (maximum to moderate assistance). Can use assistive devices.   To walk in a hospital room?  2 - A Lot - Requires a lot of help (maximum to moderate assistance). Can use assistive devices.   Climbing 3-5 steps with a railing? 1 - Total - Requires total assistance or cannot do it at all.   AMPAC Mobility Score 13   TARGET Highest Level of Mobility Mobility Level 4, Transfer to chair   ACTUAL Highest Level of Mobility Mobility Level 2, Turn self in bed/bed activity/ dependent transfer Therapeutic Exercise  Therapeutic Exercise Comments Reviewed and performed lower trunk rotations, single knee to chest, towel stretch to bilateral LE's. Reviewed posterior pelvic tilts. Encouraged daily performance and repositioning as able.    Clinical Impression  Initial Assessment PT consulted for mobility assessment. Patient presents as pain limited. Recommending continued PT to improve function. Currently patient deferred mobilization due to pain. Will continue to follow and mobilize pending pain control.   Impairments Found (describe specific impairments) aerobic capacity/endurance;mobility;muscle performance;muscle strength;balance    Frequency/Equipment Recommendations  PT Frequency 3x per week   Next Treatment Expected 12/09/22   PT/PTA completing this assessment RA    PT Recommendations for Inpatient Admission  Activity/Level of Assist assist of 2   Therapeutic Exercise encourage exercise program issued;ROM as tolerated    Education - Learning Assessment   Education Topic Functional mobility techniques;Safety;Precautions;Therapy role/rehab process;Assistance/guarding;Discharge planning;Exercise program   Learners Patient   Readiness Acceptance   Method Explanation;Demonstration   Response Verbalizes Understanding;Demonstrated Understanding    Planned Treatment / Interventions  Plan for Next Visit progress as tol    PT Discharge Summary  Physical Therapy Disposition Recommendation Other   Additional Physical Therapy Disposition Recommendations Working towards home;Home Physical Therapy   Equipment Recommendations for Discharge Patient has all necessary durable medical equipment     Harrell Gave DPT  Problem: Physical Therapy GoalsGoal: Physical Therapy GoalsDescription: PT GOALS1. Patient will perform bed mobility independently2. Patient will perform transfers with least assistive device with modified independence3. Patient will ambulate a minimum of 150 feet with least assistive device with modified independence4. Patient will ascend and descend at least 5 steps with modified independence Outcome: Initial problem identification

## 2022-12-08 NOTE — ACP (Advance Care Planning)
[facility] - Internal Medicine Service Resident Inpatient Advance Care Planning Note Patient: Carlos Caldwell, a 77 y.o. maleAttending Provider: Gwen Pounds, MDAdmission Date: 8/14/2024Decision Making Ability: able to make decisions for selfDiscussion was had with Sydell Axon about code status. The elements of CPR and intubation were explained to the patient. There was an opportunity to ask questions and seek clarification of terms.Overall Goals: The patient wants to try everythingCode Status: This patient's status is FULL CODE/ACLS.YES Chest compressions YES IntubationYES VasopressorsYES NIPPVYES CardioversionIn the event he/she cannot make decisions for him/herself, the patient designated  Primary Emergency Contact: Fellman,Maura, Home Phone: [phone] to be the proxy medical decision maker.Signed:Jamecia Lerman Threasa Beards, MDInternal Medicine, PGY-3Available on MHB

## 2022-12-08 NOTE — Utilization Review (ED)
UM Status: Managed Medicare IPEpidural abscess. Ortho and ID consulted. IV antibiotics.

## 2022-12-08 NOTE — ED Notes
Assumed care of pt after receiving report from outgoing RN.  Pt requesting tylenol.  Provider notified.

## 2022-12-08 NOTE — ED Notes
7:45 PM S: Back Pain B: Chronic Back Pain, loss of full ambulatory functioning, requiring walkerA: A&Ox4, Calm and Cooperative, Pain Managed R: Pending MRI/ ED Obs DispoChief Complaint Patient presents with  Back Pain   Came in by EMS from home with low back pain, seen here last week for same, pt currently on PT.  Past Medical History: Diagnosis Date  BCC (basal cell carcinoma), ear, right   Patent foramen ovale   Peripheral neuropathy  Past Surgical History: Procedure Laterality Date  COLONOSCOPY  07/2014  JOINT REPLACEMENT    b/l hips  ORTHOPEDIC SURGERY    ROTATOR CUFF REPAIR Bilateral   TOTAL HIP ARTHROPLASTY  2000  TOTAL HIP ARTHROPLASTY  2003  VASCULAR SURGERY    L carotid endarterectomy 8:58 PMPt requesting pain coverage. Pt updated on plan of care. Pending transfer to Obs 11:15 PMResumed care of patient at this time. Pt awaiting MRI for further data regarding back pain12:00 AMPt given medication for pain and anxiety prior to being taken to MRI. Pt transported to MRI at this time. 1:00 AM Pt returned from MRI at this time. Pt resting, laying on side. Repositioned, pending results 2:20 AMUpdate from ED Provider about patient. Pt needing repeat imaging. Ortho consulted for patient. Pending labs, more imaging at this time.

## 2022-12-08 NOTE — H&P
[facility] -Internal Medicine History & Physical Patient: Carlos Caldwell, a 77 y.o. male Attending Provider: Gwen Pounds, MD Admission Date: 8/14/2024Hospital Day: Hospital Day: 2 Admission Diagnosis: Microscopic hematuria [R31.29]Epidural abscess [G06.2]Back pain, unspecified back location, unspecified back pain laterality, unspecified chronicity [M54.9] History of Present Illness YN:WGNFAOZ Obtained Via: patient, chart reviewWilliam Caldwell is a 77 y.o. 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 On 8/3, he presented to the [facility] ED where he received plain films of his spine showing multilevel degenerative changes and was given ketorolac and a prednisone taper.His pain eventually got worse to the point where yesterday he had struggled to walk, in additional to having chills (without objective fever), so his wife called 911, who initially sent him to the [facility] ED again, where they got a [place] lumbar spine, showing again multilevel degenerative changes and severe spinal stenosis. He was then transferred to the [facility].ED Course: Vitals were T 98.71F, HR 90, RR 16, BP 118/68, Satting 95% on RA. Labs were notable for leukocytosis to 20.9. MR lumbar spine was performed and was consistent with discitis and spinal epidural abscesses. Orthopedic surgery saw him in the ED and recommended non-operative treatment and to complete imaging workup with MRI thoracic and cervical spine. Patient was started on CTX/flagyl/vanc and ID was consulted. Patient was then admitted to the floor.Floor Course:On arrival, the patient was in no acute distress and accompanied by his wife. He denied any cough, SOB, chest pain, dysuria, urinary frequency, fevers, diaphoresis. Pertinent Medical History: Past Medical History: Diagnosis Date  BCC (basal cell carcinoma), ear, right   Patent foramen ovale   Peripheral neuropathy  No past medical history pertinent negatives. Family History Problem Relation Age of Onset  Heart failure Father       died CHF age 39  Past Surgical History: Procedure Laterality Date  COLONOSCOPY  07/2014  JOINT REPLACEMENT    b/l hips  ORTHOPEDIC SURGERY    ROTATOR CUFF REPAIR Bilateral   TOTAL HIP ARTHROPLASTY  2000  TOTAL HIP ARTHROPLASTY  2003  VASCULAR SURGERY    L carotid endarterectomy No past surgical history pertinent negatives on file. Social History Socioeconomic History  Marital status: Married   Spouse name: Not on file  Number of children: Not on file  Years of education: Not on file  Highest education level: Not on file Occupational History  Not on file Tobacco Use  Smoking status: Never  Smokeless tobacco: Never Substance and Sexual Activity  Alcohol use: No  Drug use: No  Sexual activity: Not on file Other Topics Concern  Not on file Social History Narrative  Not on file Social Determinants of Health Financial Resource Strain: Not on file Food Insecurity: Not on file Transportation Needs: Not on file Physical Activity: Not on file Stress: Not on file Social Connections: Not on file Intimate Partner Violence: Not on file Housing Stability: Not on file  Home Medications: Medications Prior to Admission Medication Sig Dispense Refill Last Dose  aspirin 81 MG EC tablet Take 1 tablet (81 mg total) by mouth daily.     cholecalciferol (VITAMIN D) 1,000 unit tablet Take 1 tablet (1,000 Units total) by mouth daily.     cyclobenzaprine (FLEXERIL) 10 mg tablet Take 1 tablet (10 mg total) by mouth 3 (three) times daily as needed for muscle spasms for  up to 10 days. 30 tablet 0   fluticasone propionate (FLONASE) 50 mcg/actuation nasal spray SHAKE LIQUID AND USE 1 SPRAY IN EACH NOSTRIL DAILY 48 g 2   lidocaine (LIDODERM) 5 % Place 1 patch onto the skin every 24 hours. Remove & Discard patch within 12 hours or as directed by MD 30 patch 0   loratadine (CLARITIN) 10 mg tablet Take 1 tablet (10 mg total) by mouth.     Miscellaneous Medical Supply Use as directed.  Patient needs customize bilateral AFO braces.Dx G.60.0, R26.9, R23.8 1 each 0   multivitamin capsule Take 1 capsule by mouth daily.     naproxen sodium (ANAPROX) 220 MG tablet Take 1 tablet (220 mg total) by mouth 2 (two) times daily with breakfast and dinner.     potassium citrate (UROCIT-K) 10 mEq (1,080 mg) extended release tablet      predniSONE (DELTASONE) 10 mg tablet 3 tabs x 4days, 2 tabs x 4days 1 tabs x 4day, then 0.5 tab x 4 days. Take with food 32 tablet 0   sildenafiL (VIAGRA) 100 mg tablet TAKE 1 TABLET(100 MG) BY MOUTH DAILY AS NEEDED FOR ERECTILE DYSFUNCTION 18 tablet 2   simvastatin (ZOCOR) 10 mg tablet TAKE 1 TABLET(10 MG) BY MOUTH EVERY NIGHT 90 tablet 3   zolpidem (AMBIEN) 10 mg tablet Take 1 tablet (10 mg total) by mouth nightly as needed.. 90 tablet 1  Allergies as of 12/07/2022  (No Known Allergies) Objective Current Medications This Admission:Scheduled: Current Facility-Administered Medications Medication Dose Route Frequency Provider Last Rate Last Admin  aspirin EC delayed release tablet 81 mg  81 mg Oral Daily Doristine Bosworth [place], [place]   81 mg at 12/08/22 0807  [START ON 12/09/2022] cefTRIAXone (ROCEPHIN) 2 g in sodium chloride 0.9% PF 20 mL (100 mg/mL)  2 g Intravenous Q24H Forschino, Koleen Nimrod, [place]      [START ON 12/09/2022] cetirizine (ZyrTEC) tablet 10 mg  10 mg Oral Daily Bosie Clos, MD      lidocaine 4 % topical patch 1 patch  1 patch Transdermal Q24H Forschino, Koleen Nimrod, PA-C      metroNIDAZOLE (FLAGYL) 500 mg/100 mL IVPB 500 mg  500 mg Intravenous Q8H Morris, Maisie Fus A, [place] Stopped at 12/08/22 1152  rosuvastatin (CRESTOR) tablet 10 mg  10 mg Oral Daily Berton Lan, [place]   10 mg at 12/08/22 0807  vancomycin (VANCOCIN) 1.25 g in sodium chloride 0.9% 250 mL IVPB  1.25 g Intravenous Q12H Wonda Horner, [place]   Stopped at 12/08/22 0539  [START ON 12/09/2022] Vancomycin MAR Level   Intravenous Once Forschino, Koleen Nimrod, PA-C      PRN: cyclobenzaprine, diazePAM, zolpidem Continuous:  Vitals (Last 24 Hrs):Temp:  [97.5 ?F (36.4 ?C)-98.7 ?F (37.1 ?C)] 97.5 ?F (36.4 ?C)Pulse:  [63-90] 65Resp:  [14-18] 17BP: (89-152)/(64-92) 152/92SpO2:  [91 %-96 %] 96 %Device (Oxygen Therapy): room airWt Readings from Last 5 Encounters: 12/07/22 99.8 kg 11/26/22 99.8 kg 08/05/22 105.7 kg 06/16/22 105.7 kg 06/14/21 103.4 kg  I/O's (Last 24 Hrs):Gross Totals (Last 24 hours) at 12/08/2022 1426Last data filed at 12/08/2022 1152Intake 439.46 ml Output -- Net 439.46 ml  Physical Exam:General: NAD, with wife at sideHEENT: NCATCV: RRR, no murmursPulm: CTAB, normal WOB on RAAbd: Soft, NTNDExt: WWP. No edema. Pain to palpation of lumbar spine area.Neuro: UE 5/5 throughout. LE 4/5 throughout with mild chronic sensory deficits to light touchPsych: AOx4, answering all questions appropriatelyDiagnostic Studies BMP/Lytes: Recent Labs Lab 08/14/241145 NA 134* K 4.2 CL 97* CO2  28 ANIONGAP 9 BUN 23 CREATININE 0.82 CALCIUM 9.3 LFTs: No results for input(s): AST, ALT, ALKPHOS, BILITOT, BILIDIR, INR, PTT, ALBUMIN in the last 168 hours.CBC: Recent Labs Lab 08/14/241145 WBC 20.9* HGB 16.3 PLT 195 Glucose Trend: Recent Labs Lab 08/14/241145 GLU 116*  Imaging/Monitors  RadiologyMRI Lumbar Spine with and without IV ContrastResult Date: 8/15/2024Abnormal signal and enhancement compatible with bilateral L5-S1 septic facet joint arthritis, with mild signal abnormality and enhancement about the L5-S1 endplates also suggestive of discitis. There is prominent circumferential epidural phlegmon and small septated abscesses, in the lower lumbar spine spanning from L4 through S1-S2 levels resulting in severe spinal canal stenosis. Recommend urgent spine surgery consultation. Above findings were communicated to and acknowledged by Carlos Caldwell at 12/08/2022 1:56 AM. [facility] Radiology Notify System Classification: Clinical team aware. Reported and signed by: Dion Saucier, MD  [facility] Radiology and Biomedical Imaging CXRResult Date: 8/14/2024No acute findings. [facility] Radiology Notify System Classification:  Routine. Reported and signed by: Bradly Bienenstock, MD  [facility] Radiology and Biomedical Imaging [place] Lumbar Spine wo IV ContrastResult Date: 12/07/2022 Multilevel degenerative disease of the lumbar spine with severe left-sided neural foraminal stenosis at L5-S1. No acute fracture or subluxation. [facility] Radiology Notify System Classification: Routine. Report initiated by:  Catha Brow, MD Reported and signed by: Bradly Bienenstock, MD  [facility] Radiology and Biomedical Imaging  CXRResult Date: 8/14/2024No acute findings. [facility] Radiology Notify System Classification:  Routine. Reported and signed by: Bradly Bienenstock, MD  [facility] Radiology and Biomedical Imaging    CardiacEKG:Results for orders placed or performed in visit on 01/31/18 EKG  Narrative  See MD Notes TTE:No results found for this or any previous visit. Impression Carlos Caldwell is a 77 y.o. 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 Unclear what the source is - possibly SSTI from chronic foot wounds? Exam reassuring for no spinal cord compression.- f/u MR lumbar and cervical spine; planned for this evening- ID consulted and following; appreciate recs- c/w CTX/flagyl/vanc; will change based on ID recs- f/u TTE- consider IR consult for biopsy of abscessChronic Stable Problems:#HLD: c/w rosuvastatin#Allergies: c/w zyrtec#Hospital Checklist:Activity: OOB as tolerated PT/OT: Ongoing DVT Px: lovenox Code: FULL CODE Access: PIV  Diet: Regular Fluids: None Barriers to Dispo: Pending Plan to be discussed with attending, Dr. Loleta Chance. Addendum to follow.Signed:Kareen Jefferys Threasa Beards, MDInternal Medicine, PGY-3Available on MHB

## 2022-12-08 NOTE — Progress Notes
Orthopaedic Spine Progress NoteAdmit date: 12/07/2022, Hospital Day: 2 Dx: L4-S2 posterial spinal epidural collectionDOI: 12/07/2022, Subjective: low back painPhysical Exam:His vital signs today were Blood pressure (!) 89/64, pulse 65, temperature 97.5 ?F (36.4 ?C), temperature source Oral, resp. rate 18, height 6' 1 (1.854 m), weight 99.8 kg, SpO2 96 %.. Gen: Awake, alert, NAD Lung: unlabored breathing on NC/RASkin intactminimal tenderness to palpation over spinous processesNo appreciable step offsNo scoliosis grosslyMotor:   R L C5 (Shoulder abd) 5 /5 5 /5 C5 (Elbow Flex) 5 /5 5/5 C6 (wrist ext) 5 /5 5 /5 C7 (Elbow ext) 5 /5 5 /5 C8 (Finger flex) 5 /5 5 /5 T1 (Hand intrinsics) 5 /5 5 /5   R L L2 (Hip flexors) 5 /5 5 /5 L3 (Knee ext) 5 /5 5 /5 L4(Tibialis ant) 5 /5 5 /5 L5 (EHL) 5 /5 5 /5 S1 (Gastroc) 5 /5 5 /5  Sensation:  R L C5 (Lateral arm) + + C6 (Thumb) + + C7 (Long finger) + + C8 (Small finger) + + T1 (Medial elbow) + +    R L L2 (Medial thigh) + + L3 (Medial knee) + + L4 (Medial ankle) + + L5 (1st web space) + + S1 (Lateral heel) + +  Reflexes:  R L C5 (Biceps) ++ ++ C6 (Brachioradialis) ++ ++ C7 (Triceps) ++ ++ L4 (Patellar) ++ ++ S1 (Achilles) ++ ++  TENSION SIGNS Negative straight-leg raiseNegative crossed straight-leg raiseUMN SIGNSNegative Hoffman's No evidence of clonus Rectal tone: intactPerineal sensation: intactImaging: Abnormal signal and enhancement compatible with bilateral L5-S1 septic facet joint arthritis, with mild signal abnormality and enhancement about the L5-S1 endplates also suggestive of discitis. There is prominent circumferential epidural phlegmon and small septated abscesses, in the lower lumbar spine spanning from L4 through S1-S2 levels resulting in severe spinal canal stenosis Labs:Lab Results Component Value Date  WBC 20.9 (H) 12/07/2022  HGB 16.3 12/07/2022  HCT 48.80 12/07/2022  MCV 93.3 12/07/2022  PLT 195 12/07/2022 Lab Results Component Value Date  NA 134 (L) 12/07/2022  K 4.2 12/07/2022  CL 97 (L) 12/07/2022  CO2 28 12/07/2022 Lab Results Component Value Date  CREATININE 0.82 12/07/2022 No results found for: INR, PROTIMEAssessment/Plan: 77 y.o. male with a history of bilateral hip replacements, nephrolithiasis, basal cell carcinoma. He  presented with low back pain. For 2 weeks he had back pain as well as occasional sciatica pain that shoots down to posterior aspect of the thigh and occasionally posterior aspect of the calf.  Denies any significant changes in his motors or sensations. Denies any fevers.But had some chills.MRI C and T spine W and WO contrast:Abnormal signal and enhancement compatible with bilateral L5-S1 septic facet joint arthritis, with mild signal abnormality and enhancement about the L5-S1 endplates also suggestive of discitis. There is prominent circumferential epidural phlegmon and small septated abscesses, in the lower lumbar spine spanning from L4 through S1-S2 levels resulting in severe spinal canal stenosis -Activity:  as tolerated, should ambulate with AFOs-Diet:  ok for diet-PVRs-Abx: broad spectrum, per primary-Pain control-Bowel Regimen-Home meds when able-PT/OT-Dispo: Nonoperative management, per primaryFuture Appointments Date Time Provider Department Center 06/22/2023  1:20 PM Scherrie November, MD [facility] OUTPATI Please use contact information below Contact information: [address] [place] and weeknights: Ortho Floor pager [phone]: Please check Qgenda for Ortho Floor ResidentSRC: [facility] CampusUniversity Orthopaedic TeamWeekdays (7:00 am - 4:30 pm Mon-Fri): Ortho PA ([phone])Weeknights (4:30 pm - 7:00 am Sun-Thurs) Weekends (Friday after 4:30 PM, All-day Saturday, and Sunday until 4:30PM:  Please page Ortho Floor [phone] Spine Service can be contacted on [facility] Spine Serv Prov Dynamic Role at [phone]

## 2022-12-08 NOTE — ED Notes
3:46 AM Report from previous RN. Dispo pending MRI and Ortho consult

## 2022-12-08 NOTE — Consults
VANCOMYCIN CONSULT Current Vancomycin Order: 1.25 g IV every 12 hours.  Day of Therapy: 1Vancomycin Indication: Lumbar Spinal Epidural AbscessRenal Function: StableScr: Creatinine (mg/dL) Date Value 52/77/8242 0.82 11/26/2022 0.86 06/16/2022 0.93  CrCl: Estimated Creatinine Clearance: 87 mL/min (by C-G formula based on SCr of 0.82 mg/dL).Level: No results found for: Bertram Gala results found for: MRSA COLONIZATION STATUS PCRNo results found for: MRSA SURVEILLANCE PCRTrough Goal: 10-15Vancomycin level: Yes, indicated based on the following: borderline weight for dose adjustmentRepeat level: Trough Level on 8/16 at 1400[facility] Vancomycin Pharmacist-Driven Dosing & Monitoring Protocol for Adults Neonatal and Pediatric Vancomycin Dosing GuidelineFor questions, please contact the pharmacist: Dickey Gave, PharmD         Phone/Mobile Heartbeat: MHB

## 2022-12-08 NOTE — Plan of Care
Plan of Care Overview/ Patient Status    Admission Note Nursing Carlos Caldwell is a 77 y.o. male admitted with a chief complaint of back pain. Patient arrived from  Patient is   A/o x4, VSS room air. C/o back pain, prn toradol & flexeril given with positive effect. Denies chest pain. Continent B/B, LBM 12/06/22. Callus to right lateral malleoulus healing. PIV patent. IV abx administered. Pills whole. Hourly rounding completed, safety maintained, call bell within reach.  Vitals:  12/08/22 1138 12/08/22 1139 12/08/22 1455 12/08/22 1933 BP: (!) 152/92  (!) 164/83 (!) 165/81 Pulse:   (!) 94 (!) 94 Resp: 17  20  Temp:   97.4 ?F (36.3 ?C)  TempSrc:   Oral  SpO2: 96% 96% 94%  Weight:     Height:     Oxygen therapy Oxygen TherapySpO2: 94 %Device (Oxygen Therapy): room airI have reviewed the patient's current medication orders..I have reviewed patient valuables Belongings charted in last 7 days: Patient Valuables   Patient Valuables Flowsheet                    PATIENT VALUABLE(S)       Clothing Disposition At bedside/locker/closet 12/08/22 1705  Vision Disposition At bedside/locker/closet 12/08/22 1705   Cell phone disposition At bedside/locker/closet 12/08/22 1705  Jewelry disposition At bedside/locker/closet 12/08/22 1705   Other disposition At bedside/locker/closet 12/08/22 1705     Comments:See flowsheets, patient education and plan of care for additional information.

## 2022-12-08 NOTE — ED Notes
10:26 PM Floor Handoff Telemetry: 	[]  Yes		[x]  NoCode Status:   [x]  Full		[]  DNR		[]  DNI		Other (specify):Safety Precautions: [x]  Fall Risk  []  Sitter   []  Restraints	[]  Suicidal	[]  None	Other (specify):Mentation/Orientation:	 A&O (Self, person, place, time) x     4     	 Disoriented to:                    	 Special Accommodations: []  Hearing impaired   []  Blind  []  Nonverbal  []  Cognitive impairmentOxygenation Upon Admission: [x]  RA	[]  NC	[]  Venti  []  Simple Mask []  Other	Baseline O2 Status? []  Yes	[]  NoAmbulation: []  Independent	[]  Cane   [x]  Walker	[]  Wheelchair	[]  Bedbound		[]  Hemiplegic	[]  Paraplegic	[]  QuadraplegicEliminiation: [x]  Independent	[]  Commode	[x]  Bedpan/Urinal  []  Straight Cath []  Foley cath			[]  Urostomy	[]  Colostomy	Other (specify):Diarrhea/Loose stool : []  1x within 24h  []  2x within 24h  []  3x within 24h  [x]  None 	C.Diff Order: 	[]  Ordered- needs to be collected             []  Collected-sent to lab             []  Resulted - Negative C.Diff             []  Resulted - Positive C.Diff[x]  Not Ordered   [x]  N/ASkin Alteration: []  Pressure Injury []  Wound []  None [x]  Skin not assessedDiet: [x]  Regular/No order placed	[]  NPO		Other (specify):IV Access: [x]  PIV   []  PICC    []  Port    [] Central line    []  A-line    Other (specify)IVF/GTT Running Upon ED Departure? [x]  No	    []  Yes (specify):Outstanding Meds/Treatments/Tests:Patient Belongings:Are the belongings documented?          [x]  No	    []  YesIs someone taking belongings home?   []  No     []  Yes  Who? (specify)                                   Rance Muir, RN

## 2022-12-08 NOTE — Other
[name]  REQUEST  DOCUMENTATION-[facility] NOTE-Type of consult: [facility] Infectious Diseases General/HIV -New Consult: Carlos Caldwell Sydell Axon / Location: C02/C02 / Brief Clinical Question: epidural abscesss and discititis antibiotics/Callback Cell Phone: [phone] / Use .consultnotetemplate or add .consultrecommendation to your consult notes. Remind your attending to use .consultattestation /Please confirm receipt of this message by texting back ?OK?-1 - Mobile Heartbeat message sent to Luu, T at 10:32 AM. Received response at 10:32.-[name], PCT8/15/202410:31 [facility] [phone]

## 2022-12-09 LAB — CBC WITH AUTO DIFFERENTIAL
BKR WAM ABSOLUTE IMMATURE GRANULOCYTES.: 0.06 x 1000/ÂµL (ref 0.00–0.30)
BKR WAM ABSOLUTE LYMPHOCYTE COUNT.: 0.83 x 1000/ÂµL (ref 0.60–3.70)
BKR WAM ABSOLUTE NRBC (2 DEC): 0 x 1000/ÂµL (ref 0.00–1.00)
BKR WAM ANC (ABSOLUTE NEUTROPHIL COUNT): 6.74 x 1000/ÂµL (ref 2.00–7.60)
BKR WAM BASOPHIL ABSOLUTE COUNT.: 0.04 x 1000/ÂµL (ref 0.00–1.00)
BKR WAM BASOPHILS: 0.5 % (ref 0.0–1.4)
BKR WAM EOSINOPHIL ABSOLUTE COUNT.: 0.11 x 1000/ÂµL (ref 0.00–1.00)
BKR WAM EOSINOPHILS: 1.3 % (ref 0.0–5.0)
BKR WAM HEMATOCRIT (2 DEC): 49 % (ref 38.50–50.00)
BKR WAM HEMOGLOBIN: 15.8 g/dL (ref 13.2–17.1)
BKR WAM IMMATURE GRANULOCYTES: 0.7 % (ref 0.0–1.0)
BKR WAM LYMPHOCYTES: 9.8 % — ABNORMAL LOW (ref 17.0–50.0)
BKR WAM MCH (PG): 30.5 pg (ref 27.0–33.0)
BKR WAM MCHC: 32.2 g/dL (ref 31.0–36.0)
BKR WAM MCV: 94.6 fL (ref 80.0–100.0)
BKR WAM MONOCYTE ABSOLUTE COUNT.: 0.67 x 1000/ÂµL (ref 0.00–1.00)
BKR WAM MONOCYTES: 7.9 % (ref 4.0–12.0)
BKR WAM MPV: 9.5 fL (ref 8.0–12.0)
BKR WAM NEUTROPHILS: 79.8 % — ABNORMAL HIGH (ref 39.0–72.0)
BKR WAM NUCLEATED RED BLOOD CELLS: 0 % (ref 0.0–1.0)
BKR WAM PLATELETS: 189 x1000/ÂµL (ref 150–420)
BKR WAM RDW-CV: 13 % (ref 11.0–15.0)
BKR WAM RED BLOOD CELL COUNT.: 5.18 M/ÂµL (ref 4.00–6.00)
BKR WAM WHITE BLOOD CELL COUNT: 8.5 x1000/ÂµL (ref 4.0–11.0)

## 2022-12-09 LAB — BASIC METABOLIC PANEL
BKR ANION GAP: 10 (ref 7–17)
BKR BLOOD UREA NITROGEN: 24 mg/dL — ABNORMAL HIGH (ref 8–23)
BKR BUN / CREAT RATIO: 30 — ABNORMAL HIGH (ref 8.0–23.0)
BKR CALCIUM: 8.9 mg/dL (ref 8.8–10.2)
BKR CHLORIDE: 101 mmol/L (ref 98–107)
BKR CO2: 26 mmol/L (ref 20–30)
BKR CREATININE: 0.8 mg/dL (ref 0.40–1.30)
BKR EGFR, CREATININE (CKD-EPI 2021): >60 mL/min/{1.73_m2} (ref >=60)
BKR GLUCOSE: 120 mg/dL — ABNORMAL HIGH (ref 70–100)
BKR POTASSIUM: 4.2 mmol/L (ref 3.3–5.3)
BKR SODIUM: 137 mmol/L (ref 136–144)

## 2022-12-09 LAB — VANCOMYCIN, TROUGH: BKR VANCOMYCIN TROUGH: 11.4 ug/mL (ref 10.0–15.0)

## 2022-12-09 MED ORDER — OXYCODONE IMMEDIATE RELEASE 5 MG TABLET
5 mg | Freq: Four times a day (QID) | ORAL | Status: DC | PRN
Start: 2022-12-09 — End: 2022-12-13
  Administered 2022-12-09 – 2022-12-13 (×11): 5 mg via ORAL

## 2022-12-09 MED ORDER — GADOTERATE MEGLUMINE 0.5 MMOL/ML (376.9 MG/ML) INTRAVENOUS SOLUTION
0.5 | Freq: Once | INTRAVENOUS | Status: CP | PRN
Start: 2022-12-09 — End: ?
  Administered 2022-12-09: 09:00:00 0.5 mL via INTRAVENOUS

## 2022-12-09 MED ORDER — ACETAMINOPHEN 325 MG TABLET
325 mg | Freq: Four times a day (QID) | ORAL | Status: DC | PRN
Start: 2022-12-09 — End: 2022-12-13
  Administered 2022-12-09 – 2022-12-11 (×2): 325 mg via ORAL

## 2022-12-09 MED ORDER — SENNOSIDES 8.6 MG TABLET
8.6 mg | Freq: Every evening | ORAL | Status: DC
Start: 2022-12-09 — End: 2022-12-13
  Administered 2022-12-11 – 2022-12-13 (×2): 8.6 mg via ORAL

## 2022-12-09 MED ORDER — METRONIDAZOLE 500 MG TABLET
500 mg | Freq: Three times a day (TID) | ORAL | Status: DC
Start: 2022-12-09 — End: 2022-12-13
  Administered 2022-12-09 – 2022-12-13 (×12): 500 mg via ORAL

## 2022-12-09 MED ORDER — POLYETHYLENE GLYCOL 3350 17 GRAM ORAL POWDER PACKET
17 gram | Freq: Every day | ORAL | Status: DC
Start: 2022-12-09 — End: 2022-12-13
  Administered 2022-12-12: 14:00:00 17 gram via ORAL

## 2022-12-09 NOTE — Progress Notes
Orthopaedic Spine Progress NoteAdmit date: 12/07/2022, Hospital Day: 3 Dx: L4-S2 posterial spinal epidural collectionbilateral L5-S1 septic facet joint arthritis, L5-S1 discitisDOI: 12/07/2022, Subjective: low back painPhysical Exam:His vital signs today were Blood pressure (!) 181/102, pulse (!) 93, temperature 98 ?F (36.7 ?C), temperature source Oral, resp. rate 20, height 6' 1 (1.854 m), weight 99.8 kg, SpO2 96 %.. Gen: Awake, alert, NAD Lung: unlabored breathing on NC/RASkin intactminimal tenderness to palpation over spinous processesNo appreciable step offsNo scoliosis grosslyMotor:   R L C5 (Shoulder abd) 5 /5 5 /5 C5 (Elbow Flex) 5 /5 5/5 C6 (wrist ext) 5 /5 5 /5 C7 (Elbow ext) 5 /5 5 /5 C8 (Finger flex) 5 /5 5 /5 T1 (Hand intrinsics) 5 /5 5 /5   R L L2 (Hip flexors) 5 /5 5 /5 L3 (Knee ext) 5 /5 5 /5 L4(Tibialis ant) 5 /5 5 /5 L5 (EHL) 5 /5 5 /5 S1 (Gastroc) 5 /5 5 /5  Sensation:  R L C5 (Lateral arm) + + C6 (Thumb) + + C7 (Long finger) + + C8 (Small finger) + + T1 (Medial elbow) + +    R L L2 (Medial thigh) + + L3 (Medial knee) + + L4 (Medial ankle) + + L5 (1st web space) + + S1 (Lateral heel) + +  Reflexes:  R L C5 (Biceps) ++ ++ C6 (Brachioradialis) ++ ++ C7 (Triceps) ++ ++ L4 (Patellar) ++ ++ S1 (Achilles) ++ ++  TENSION SIGNS Negative straight-leg raiseNegative crossed straight-leg raiseUMN SIGNSNegative Hoffman's No evidence of clonus Rectal tone: intactPerineal sensation: intactImaging: Abnormal signal and enhancement compatible with bilateral L5-S1 septic facet joint arthritis, with mild signal abnormality and enhancement about the L5-S1 endplates also suggestive of discitis. There is prominent circumferential epidural phlegmon and small septated abscesses, in the lower lumbar spine spanning from L4 through S1-S2 levels resulting in severe spinal canal stenosis Labs:Lab Results Component Value Date  WBC 20.9 (H) 12/07/2022  HGB 16.3 12/07/2022  HCT 48.80 12/07/2022  MCV 93.3 12/07/2022  PLT 195 12/07/2022 Lab Results Component Value Date  NA 134 (L) 12/07/2022  K 4.2 12/07/2022  CL 97 (L) 12/07/2022  CO2 28 12/07/2022 Lab Results Component Value Date  CREATININE 0.82 12/07/2022 No results found for: INR, PROTIMEAssessment/Plan: 77 y.o. male with a history of bilateral hip replacements, nephrolithiasis, basal cell carcinoma. He  presented with low back pain. For 2 weeks he had back pain as well as occasional sciatica pain that shoots down to posterior aspect of the thigh and occasionally posterior aspect of the calf.  There is prominent circumferential epidural phlegmon and small septated abscesses, in the lower lumbar spine spanning from L4 through S1-S2 levels resulting in severe spinal canal stenosis and bilateral L5-S1 septic facet joint arthritis, L5-S1 discitis.Today patient is not complaining of an increase in the pain.  Pain is stable and under control.  He is on antibiotic treatment.  There is no cauda equina findings in neurological examination.  -Activity:  as tolerated, should ambulate with AFOs-Diet:  ok for diet-PVRs-Pain control-Bowel Regimen-PT/OT-Nonoperative management at the moment, infection treatment per primaryFuture Appointments Date Time Provider Department Center 06/22/2023  1:20 PM Scherrie November, MD [facility] Please use contact information below Contact information: [address] [place] and weeknights: Ortho Floor pager [phone]: Please check Qgenda for Ortho Floor ResidentSRC: [facility]University Orthopaedic TeamWeekdays (7:00 am - 4:30 pm Mon-Fri): Ortho PA ([phone])Weeknights (4:30 pm - 7:00 am Sun-Thurs) Weekends (Friday after 4:30 PM, All-day Saturday, and Sunday until 4:30PM:  Please page Ortho Floor [phone] Spine Service can be contacted on [facility] Spine Serv Prov Dynamic Role at [phone]

## 2022-12-09 NOTE — Progress Notes
[facility]	 [facility]	Progress NoteAttending Provider: Gwen Pounds, MDHospital Day:  1ID Statement:  Oronde Kempel is a 77 y.o. 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, HLD, presenting with two weeks of worsening lower back pain, with concern for spinal epidural abscess/phlegmon.Subjective/Interval Events:  Overnight:- MRI thoracic and cervical spine done overnight- otherwise NAEToday:- Feeling tired because of poor sleep, but otherwise okayMeds: Scheduled Meds:Current Facility-Administered Medications Medication Dose Route Frequency Provider Last Rate Last Admin  aspirin EC delayed release tablet 81 mg  81 mg Oral Daily Doristine Bosworth [place], [place]   81 mg at 12/08/22 0807  cefTRIAXone (ROCEPHIN) 2 g in sodium chloride 0.9% PF 20 mL (100 mg/mL)  2 g Intravenous Q24H Forschino, Koleen Nimrod, PA-C   2 g at 12/09/22 8295  cetirizine (ZyrTEC) tablet 10 mg  10 mg Oral Daily Bosie Clos, MD      enoxaparin (LOVENOX) syringe 40 mg  40 mg Subcutaneous Daily Bosie Clos, MD      lidocaine 4 % topical patch 1 patch  1 patch Transdermal Q24H Forschino, Koleen Nimrod, PA-C      metroNIDAZOLE (FLAGYL) 500 mg/100 mL IVPB 500 mg  500 mg Intravenous Q8H Morris, Maisie Fus A, PA 200 mL/hr at 12/09/22 0448 Restarted at 12/09/22 0448  rosuvastatin (CRESTOR) tablet 10 mg  10 mg Oral Daily Berton Lan, [place]   10 mg at 12/08/22 0807  vancomycin (VANCOCIN) 1.25 g in sodium chloride 0.9% 250 mL IVPB  1.25 g Intravenous Q12H Wonda Horner, PA 166.7 mL/hr at 12/09/22 0520 1.25 g at 12/09/22 0520  Vancomycin MAR Level   Intravenous Once Forschino, Koleen Nimrod, PA-C     Objective: Vitals:Temp:  [97.4 ?F (36.3 ?C)-98 ?F (36.7 ?C)] 98 ?F (36.7 ?C)Pulse:  [93-94] 93Resp:  [17-20] 20BP: (113-181)/(70-102) 181/102SpO2:  [94 %-96 %] 96 %Device (Oxygen Therapy): room air I/O's:Intake/Output Summary (Last 24 hours) at 12/09/2022 0736Last data filed at 12/09/2022 0236Gross per 24 hour Intake 100 ml Output 300 ml Net -200 ml  Physical Exam:General: NADHEENT: NCATCardiac: RRR, no murmursPulmonary: CTAB, normal WOB on RAAbdomen: Soft NTNDMSK: No peripheral edemaDerm: No rashes or skin lesions noted.Neuro/Psych: Answering all questions appropriatelyLabs: Recent Labs Lab 08/14/241145 WBC 20.9* HGB 16.3 HCT 48.80 PLT 195  Recent Labs Lab 08/14/241145 NEUTROPHILS 91.2*  Recent Labs Lab 08/14/241145 NA 134* K 4.2 CL 97* CO2 28 BUN 23 CREATININE 0.82 GLU 116* ANIONGAP 9  Recent Labs Lab 08/14/241145 CALCIUM 9.3  No results for input(s): ALT, AST, ALKPHOS, BILITOT, BILIDIR in the last 168 hours. No results for input(s): PTT, LABPROT, INR in the last 168 hours. Microbiology:Blood culture (8/16): NGTDMRSA: NegativeImaging:MRI Cervical/Thoracic spineDate: 12/10/2022:No evidence of discitis in C/T spine.MRI Lumbar Spine with and without IV Contrast ResultDate: 8/15/2024Abnormal signal and enhancement compatible with bilateral L5-S1 septic facet joint arthritis, with mild signal abnormality and enhancement about the L5-S1 endplates also suggestive of discitis. There is prominent circumferential epidural phlegmon and small septated abscesses, in the lower lumbar spine spanning from L4 through S1-S2 levels resulting in severe spinal canal stenosis. Recommend urgent spine surgery consultation. CXRResult Date: 8/14/2024No acute findings. [place] Lumbar Spine wo IV ContrastResult Date: 8/14/2024Multilevel degenerative disease of the lumbar spine with severe left-sided neural foraminal stenosis at L5-S1. No acute fracture or subluxation. ECG/Tele Events:  * Normal left ventricular cavity size.  Normal left ventricle wall thickness.  No regional wall motion abnormalities.  Normal left ventricular systolic function.  LVEF estimated by visual assessment was between 55-60%.  Normal diastolic function  and filling pressures.  No thrombus visualized in the left ventricle.* Normal right ventricular cavity size and systolic function.* Left atrium is mildly dilated.  Right atrium is normal in size.* No significant valvular abnormalities.* No vegetation visualized on any cardiac valve on this average quality study.* All visible segments of the aorta are normal in size.* No significant pericardial effusion.* No prior study available for comparison.Assessment & Plan: Mekell Ditoro is a 77 y.o. 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 Unclear what the source is - possibly SSTI from chronic foot wounds? Exam reassuring for no spinal cord compression. TTE without evidence of endocarditis.- ID consulted and following; appreciate recs- c/w CTX/flagyl/vanc- IR deferred tissue collection given small collections; will clarify with ID if they would like IR to attempt bone biopsy- c/w tylenol, ketorolac; adding on low dose oxycodone for pain control Chronic Stable Problems:#HLD: c/w rosuvastatin#Allergies: c/w zyrtecDIET: RegularPPX: lovenoxACCESS: PIVDISPO:  FloorCODE STATUS: FULL CODEElectronically Signed:Darcy Barbara Threasa Beards, MDInternal Medicine, PGY-3Available on MHB

## 2022-12-09 NOTE — Progress Notes
VANCOMYCIN LEVEL EVALUATIONCurrent Vancomycin Order: 1.25 g IV every 12 hours. Day of Therapy: 2Vancomycin Indication: epidural spinal abscess Crcl: Estimated Creatinine Clearance: 89 mL/min (by C-G formula based on SCr of 0.8 mg/dL).Scr: Creatinine (mg/dL) Date Value 16/01/9603 0.80 12/07/2022 0.82 11/26/2022 0.86  Renal Function: Stable on 8/16Level: Lab Results Last 72 Hours Component Value Date/Time  Vancomycin Trough 11.4 12/09/2022 02:08 PM Type of Level: Trough; Level drawn appropriately? Yes Trough Goal: 10-15Lab Results Component Value Date/Time  MRSA Colonization Status by PCR Not Detected 12/08/2022 08:38 AM No results found for: MRSA SURVEILLANCE PCRBased on vancomycin level obtained, recommend:  Continue current regimenRepeat Level:  Weekly levels, next level 8/23[facility] Vancomycin Pharmacist-Driven Dosing & Monitoring Protocol for Adults Neonatal and Pediatric Vancomycin Dosing GuidelineFor questions, please contact the pharmacist: Pecola Lawless, PharmD    Phone/Mobile Heartbeat: [facility]

## 2022-12-09 NOTE — Telephone Encounter
Spoke to RN Rchel, made aware that pt had iv infiltrated when pt received the contrast. MRI tech informed this Clinical research associate and radiology resident also made aware by the tech. MD (Dr Clair Gulling) has seen the pt and is ok for pt to go back to the floor. IV access removed. Coban dressing applied and cold compress also in place. Left arm elevated on pillow. The nurse given report of what happened. -- JR RN

## 2022-12-09 NOTE — Plan of Care
Inpatient Physical Therapy Progress Note IP Adult PT Eval/Treat - 12/09/22 1047    Date of Visit / Treatment  Date of Visit / Treatment 12/09/22   Start Time 10:07   End Time 10:47   Total Treatment Time 40    General Information  Subjective Agreeable to physical therapy.   General Observations Patient presented in supine with head of bed elevated on 9-7 with a peripheral intravenous and required encouragement to participate with physical therapy.   Precautions/Limitations Fall Precautions;Bed alarm    Vital Signs and Orthostatic Vital Signs  Vital Signs Free text vital signs stable on room air    Pain/Comfort  Pain Comment (Pre/Post Treatment Pain) Patient with complaints of 3-4/10 back pain at rest and 10/10 back pain with mobility with radicular symptoms to bilateral lower extremities. Patient was premedicated. RN aware. Patient was positioned to comfort at end of session. MD present at end of session and made aware of pain limiting functional mobility.    Patient Coping  Observed Emotional State accepting   Verbalized Emotional State acceptance    Cognition  Orientation Level Oriented X4   Level of Consciousness alert   Following Commands Follows one step commands without difficulty   Personal Safety / Judgment Fall risk    Range of Motion  Range of Motion Examination bilateral upper extremity ROM was WFL;bilateral lower extremity ROM was [facility]   Range of Motion Comments history of Charcot Marie Tooth in bilateral feet with limited dorsiflexion bilaterally and left foot with increased inversion.    Manual Muscle Testing  Manual Muscle Testing Comments Strength limited by pain.    Musculoskeletal  LUE Muscle Strength Grading 3-->active movement against gravity   RUE Muscle Strength Grading 3-->active movement against gravity   LLE Muscle Strength Grading 3-->active movement against gravity   RLE Muscle Strength Grading 3-->active movement against gravity    Skin Assessment  Skin Assessment Comments See nursing assessment for details.    Balance  Sitting Balance: Static  FAIR-      Contact Guard to maintain static position with no Assistive Device   Sitting Balance: Dynamic  POOR+   Moves through 1/2 range with minimal assist to right self   Standing Balance: Static POOR      Moderate assist to maintain static position with no Assistive Device   Standing Balance: Dynamic  POOR     Moves through 1/4 to 1/2 ROM range with moderate assist to right self   Balance Assist Device Hand held assist   Balance Skills Training Comment Assistance x 1.    Bed Mobility  Rolling/Turning Right - Independence/Assistance Level Contact guard   Rolling/Turning Left - Independence/Assistance Level Contact guard   Rolling/Turning Assist Device Bed rails;Head of bed elevated   Supine-to-Sit Independence/Assistance Level Contact guard   Supine-to-Sit Assist Device Bed rails;Head of bed elevated   Sit-to-Supine Independence/Assistance Level Contact guard   Sit-to-Supine Assist Device Bed rails;Head of bed elevated   Bed Mobility, Impairments strength decreased;impaired balance;decreased endurance/activity tolerance;pain   Bed mobility was limited by: pain   Bed Mobility Comments Patient required increased time to attain upright sitting and upon return to supine patient also required increased time to reposition due to pain.    Soil scientist Independence/Assistance Level Moderate assist   Sit-to-Stand Transfer Assist Device Hand held assist   Stand-to-Sit Transfer Independence/Assistance Level Moderate assist   Stand-to-Sit Transfer Assist Device Hand held assist   Sit-Stand Transfer Comments Patient unable to tolerate  full upright stance due to pain in back with radicular symptoms.   Stand-Sit Transfer Comments Poor eccentric lowering.    Bed-Chair Furniture conservator/restorer Independence/Assistance Level Moderate assist   Bed-to-Chair Transfer Assist Device Hand held assist   Chair-to-Bed Transfer Independence/Assistance Level Moderate assist   Chair-to-Bed Transfer Assist Device Hand held assist   Transfer Safety Analysis Concerns decreased weight-shifting ability   Transfer Safety Analysis Impairment impaired balance;decreased strength;decreased endurance/activity tolerance;pain   Bed-Chair Transfer Comments Patient performed squat pivot transfer from bed to chair. Patient requested to transfer to chair prior to donning bilateral AFOs. Patient unable to tolerate chair positioning due to pain and declined to perform donning of AFOs.   Chair-Bed Transfer Comments Patient with decreased coordination of motors from chair to bed due to pain. Patient laying himself down on bed perpendicular to edge of bed prior to being able to reposition to head at head of bed.    Gait Training  Independence/Assistance Level  Unable to assess   Ambulation distance was limited by: pain    Handoff Documentation  Handoff Patient in bed;Bed alarm;Patient instructed to call nursing for mobility;Discussed with nursing    PT- AM-PAC - Basic Mobility Screen- How much help from another person do you currently need.....  Turning from your back to your side while in a a flat bed without using rails? 3 - A Little - Requires a little help (supervision, minimal assistance). Can use assistive devices.   Moving from lying on your back to sitting on the side of a flat bed without using bed rails? 3 - A Little - Requires a little help (supervision, minimal assistance). Can use assistive devices.   Moving to and from a bed to a chair (including a wheelchair)? 2 - A Lot - Requires a lot of help (maximum to moderate assistance). Can use assistive devices.   Standing up from a chair using your arms(e.g., wheelchair or bedside chair)? 2 - A Lot - Requires a lot of help (maximum to moderate assistance). Can use assistive devices.   To walk in a hospital room? 2 - A Lot - Requires a lot of help (maximum to moderate assistance). Can use assistive devices.   Climbing 3-5 steps with a railing? 1 - Total - Requires total assistance or cannot do it at all.   AMPAC Mobility Score 13   TARGET Highest Level of Mobility Mobility Level 4, Transfer to chair   ACTUAL Highest Level of Mobility Mobility Level 4, Transfer to chair    Therapeutic Exercise  Therapeutic Exercise Comments Encouraged daily therapeutic exercise/repositioning. OOB to chair with assistance x 1. AROM x 4 extremities.    Neuromuscular Re-education  Neuromuscular Re-education Comments Static/dynamic sitting/standing balance. Standing balance limited by pain with radicular symptoms.    Therapeutic Functional Activity  Therapeutic Functional Activity Comments Bed mobility, transfers, positioning, patient education, discharge planning.    Clinical Impression  Follow up Assessment Patient with deficits of pain, range of motion, strength, balance, activity tolerance and endurance impairing functional mobility. Patient would benefit from physical therapy to address deficits to maximize functional mobility.   Criteria for Skilled Therapeutic Interventions Met treatment indicated    Patient/Family Stated Goals  Patient/Family Stated Goal(s) decrease pain    Frequency/Equipment Recommendations  PT Frequency 3x per week   Next Treatment Expected 12/12/22   PT/PTA completing this assessment KC    PT Recommendations for Inpatient Admission  Activity/Level of Assist out of bed;transfers only;assist of 2   Positioning reposition  frequently   Therapeutic Exercise ROM as tolerated    Planned Treatment / Interventions  Education Treatment / Interventions Patient Education / Training    PT Discharge Summary  Physical Therapy Disposition Recommendation Other Additional Physical Therapy Disposition Recommendations Working towards home;Home Physical Therapy   pending improved pain control, otherwise may require STR    Charlestine Massed, PT

## 2022-12-09 NOTE — Plan of Care
Plan of Care Overview/ Patient Status    0700-1900: A+Ox4. Hypertensive, all other VSS on RA, afebrile. 9/10 pain to back, PRN toradol, Tylenol and Oxy given w/ + effect. Continent x2, voids spontaneously, LBM 12/09/2022 via bedpan. Ass x1 in bed. L heel callous, dry, intact, OTA. 22G to RA, patent, flushes w/o difficulty. Reg diet, adequate intake, pills swallowed whole. Scheduled meds given per MAR. Safety maintained, call bell w/in reach, purposeful rounding. 1840: BP of 179/76, asymptomatic, covering provider made aware. BP rechecked at 1940 w/ improvement to 159/76. BP (!) 159/76  - Pulse 73  - Temp 97.6 ?F (36.4 ?C) (Oral)  - Resp 19  - Ht 6' 1 (1.854 m)  - Wt 99.8 kg  - SpO2 96%  - BMI 29.03 kg/m? Problem: Adult Inpatient Plan of CareGoal: Plan of Care ReviewOutcome: Interventions implemented as appropriateGoal: Patient-Specific Goal (Individualized)Outcome: Interventions implemented as appropriateGoal: Absence of Hospital-Acquired Illness or InjuryOutcome: Interventions implemented as appropriateGoal: Optimal Comfort and WellbeingOutcome: Interventions implemented as appropriateGoal: Readiness for Transition of CareOutcome: Interventions implemented as appropriate Problem: Physical Therapy GoalsGoal: Physical Therapy GoalsDescription: PT GOALS1. Patient will perform bed mobility independently2. Patient will perform transfers with least assistive device with modified independence3. Patient will ambulate a minimum of 150 feet with least assistive device with modified independence4. Patient will ascend and descend at least 5 steps with modified independence Outcome: Interventions implemented as appropriate Problem: Skin Injury Risk IncreasedGoal: Skin Health and IntegrityOutcome: Interventions implemented as appropriate Problem: Fall Injury RiskGoal: Absence of Fall and Fall-Related InjuryOutcome: Interventions implemented as appropriate Problem: Wound Healing ProgressionGoal: Optimal Wound HealingOutcome: Interventions implemented as appropriate

## 2022-12-09 NOTE — Plan of Care
Plan of Care Overview/ Patient Status    Pt is a 77 y/o male. Pt with moderate pain at this time.Neuro: AxO 4; disorientation overnight when awoken. Reoriented as more awake. Braces for legs. Assist x2 when OOB. CV: No tele order. HTN overnight. MD notified. No new orders placed. Resp: RA. Lungs clear and equal bilaterally. GI: incontinent of bowel overnight. Good PO intake.  Last Bowel Movement: 08/13/24GU: intermittent continence of bladder overnight. Urinal within reach. Skin:  See skin flowsheet. T+P q2h, wedge in use, no. Skin check performed. Does not have foam dressing to coccyx in place. Pt remains on pressure reduction mattress and heels elevated off bed. Access Periph IV 12/07/22 1150 dorsum left arm 20 gauge (Active) SiteCare/Dressing Status/Securement dressing dry and intact 12/08/22 2000 Lumen 1 Patency/Care Patent;flushed w/o difficulty 12/08/22 2000 Site Signs asymptomatic without redness, warmth, swelling, pain, palpable cord, streak formation, drainage, dressing intact 12/08/22 2000 Phlebitis 0-->no symptoms 12/08/22 2000 Infiltration/Extravasation Assessment 0-->no symptoms 12/08/22 2000 Patient Education Instructed to keep IV site dry 12/08/22 2000 Daily Review of Necessity ** completed 12/08/22 2000 Mobility: AMPAC Mobility Score: 12Bed alarm in place, all safety measures implemented.Significant events from this shift: Patient woke up in significant pain and was disoriented. HTN of 180s systolic. Provider called to bedside. Patient oriented more as time went on and was more awake. Mix continence overnight.

## 2022-12-09 NOTE — Consults
General Infectious Diseases Initial Consult Progress Note____________________________________________________________Subjective Referring provider: Attending Provider: Gwen Pounds, MD [id number] Day: Cornell Barman of Present Illness:Carlos Caldwell is a 77 y.o., male with a PMH of bilateral hip replacements, kidney stone, hyperlipidemia, remote history of basal cell carcinoma history peripheral neuropathy, who presented on 08/14 with a chief complaint of lower back pain for the past 2 weeks.Patient endorses new symptoms of lower back pain started 2 weeks ago.  Pain is located in the lower back that radiates down to his legs bilaterally.  Patient has had progressively worsening pain to the point that he can not ambulate.  Wbc 7.6.  He was seen at [facility] on 08/03 and was given Solu-Medrol to help with inflammation.Patient took his Solu-Medrol pack without significant improvement.  He was seen again and [facility].  CBC showed WBC 20.   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 No acute fracture or subluxation.  Follow-up MRI lumbar spine with IV contrast showed findings of L5-S1 septic facet joint arthritis, L5-S1 diskitis, L4 to S2 prominent circumferential epidural phlegmon and small septated abscesses.  Orthopedic surgery was consulted who did not recommend surgical intervention.  Infectious Disease consulted for antibiotic management and workup.AntimicrobialsVancomycinCeftriaxone Metronidazole Review of Systems10 point review of systems reviewed and is negative if not described in HPIPast Medical HistoryWilliam Caldwell  has a past medical history of BCC (basal cell carcinoma), ear, right, Patent foramen ovale, and Peripheral neuropathy.Past Surgical HistoryWilliam Caldwell  has a past surgical history that includes Rotator cuff repair (Bilateral); Total hip arthroplasty (2000); Total hip arthroplasty (2003); orthopedic surgery; Colonoscopy (07/2014); Joint replacement; and Vascular surgery.Social HistoryWilliam Caldwell reports that he has never smoked. He has never used smokeless tobacco. He reports that he does not drink alcohol and does not use drugs.Family Historyfamily history includes Heart failure in his father.ImmunizationsImmunization History Administered Date(s) Administered  COVID-19, MODERNA 12Y+, 0.5 mL 06/12/2019, 07/10/2019  COVID-19, MODERNA Bivalent, 12 Yrs+, 0.5 mL 02/15/2021  COVID-19, MODERNA Booster, 0.51mL 05/07/2020  Influenza, high dose seasonal 02/28/2014, 02/26/2016, 01/21/2017, 01/31/2018  Influenza, high dose, quad, 0.7 mL, preservative free 02/05/2019, 01/11/2021, 03/04/2022  Influenza, injectable, quad with preservative 02/26/2016  Influenza, injectable, quadrivalent, preservative free 02/26/2016  Influenza, quad, adjuvanted, 0.1mL, preservative free 03/10/2020  Pneumococcal conjugate PCV 13 02/26/2016  Pneumococcal polysaccharide PPSV23 07/29/2011  Tdap 05/26/2008  ZOSTER LIVE (Zostavax) 03/12/2010  ZOSTER RECOMBINANT (Shingrix) 03/30/2020, 01/11/2021 MedicationsCurrent Facility-Administered Medications Medication Dose Route Frequency Provider Last Rate Last Admin  aspirin EC delayed release tablet 81 mg  81 mg Oral Daily Doristine Bosworth Mier, PA   81 mg at 12/08/22 0807  [START ON 12/09/2022] cefTRIAXone (ROCEPHIN) 2 g in sodium chloride 0.9% PF 20 mL (100 mg/mL)  2 g Intravenous Q24H Forschino, Koleen Nimrod, PA-C      lidocaine 4 % topical patch 1 patch  1 patch Transdermal Q24H Forschino, Koleen Nimrod, PA-C      metroNIDAZOLE (FLAGYL) 500 mg/100 mL IVPB 500 mg  500 mg Intravenous Q8H Morris, Thomas A, PA   Stopped at 12/08/22 1152  rosuvastatin (CRESTOR) tablet 10 mg  10 mg Oral Daily Berton Lan, PA   10 mg at 12/08/22 0807 vancomycin (VANCOCIN) 1.25 g in sodium chloride 0.9% 250 mL IVPB  1.25 g Intravenous Q12H Wonda Horner, [place]   Stopped at 12/08/22 0539  [START ON 12/09/2022] Vancomycin MAR Level   Intravenous Once Forschino, Koleen Nimrod, PA-C  AllergiesNo Known Allergies_______________________________________________________________________ Objective Physical ExaminationLast 24 hours: Temp:  [97.5 ?F (36.4 ?C)-98.7 ?F (37.1 ?C)] 97.5 ?F (36.4 ?C)Pulse:  [63-90] 65Resp:  [14-18] 17BP: (89-152)/(64-92) 152/92SpO2:  [91 %-96 %] 96 %02 sat and device used: SpO2: 96 %Device (Oxygen Therapy): room airGen	: AOX3, NADHEENT	: PEERL, no scleral icterus, neck supple, no LADLung	: CTAB, no crackles, wheezesCV	: RRR, normal S1, S2, faint systolic murmur Abd	: +BS, soft, ND/NTSkin	: no rashesExt	: no edema, moves allNeuro	: Aox3, + straight leg raise, strength 4/4 b/l LELaboratory examinationRecent Labs Lab 08/14/241145 WBC 20.9* HGB 16.3 HCT 48.80 PLT 195 MCV 93.3 NEUTROPHILS 91.2* LYMPHOCYTES 1.5* LYMPHABS 0.32* EOSINOPHILS 0.0 EOSABS 0.01 Recent Labs Lab 08/14/241145 NA 134* K 4.2 CL 97* CO2 28 BUN 23 CREATININE 0.82 GLU 116* No results for input(s): AST, ALT, ALKPHOS, BILITOT, ALBUMIN in the last 168 hours.CRP 74WBC 20Microbiology/Virology8/15 Bcx NGTD8/15 MRSA NEGImaging Studies8/14 [place] lumbar spine without contrastMultilevel degenerative disease of the lumbar spine with severe left-sided neural foraminal stenosis at L5-S1. No acute fracture or subluxation. 8/15 MRI lumbar spineAbnormal signal and enhancement compatible with bilateral L5-S1 septic facet joint arthritis, with mild signal abnormality and enhancement about the L5-S1 endplates also suggestive of discitis. There is prominent circumferential epidural phlegmon and small septated abscesses, in the lower lumbar spine spanning from L4 through S1-S2 levels resulting in severe spinal canal stenosis.Assessment:Carlos Caldwell is a 77 y.o., male with a PMH of bilateral hip replacements, kidney stone, hyperlipidemia, remote history of basal cell carcinoma history peripheral neuropathy, who presented on 08/14 with a chief complaint of lower back pain for the past 2 weeks.Clinical syndrome of acute lower back pain found to have f L5-S1 septic facet joint arthritis, L5-S1 diskitis, L4 to S2 prominent circumferential epidural phlegmon and small septated abscesses. Source of infection is not entirely clear now but we wonder if patient had transient bacteremia. Would obtain TTE to evaluated for endocarditis.  Please plan for tissue bx of the lumbar spine to guide antibiotic therapy. Recommendations:Diagnostic:- F/u with MRI cervical and thoracic spine - F/u Bcx - Obtain TTE- IR consult for tissue sampling. Please send for bacterial, fungal and AFB cultureTherapeutic:- Continue Vancomycin- Continue Ceftriaxone- Continue metronidazole Thank you for this consult. ID will follow Patient discussed with attending Dr. Merryl Hacker.For questions or clarifications, please contact me via Mobile Heartbeat. Please contact the on-call ID fellow if after 5:00 pm.Karver Fadden, MDInfectious Disease FellowYale University

## 2022-12-09 NOTE — Consults
[place] [facility] Interventional RadiologyConsultation Information Interventional Radiology consultation requested by: Gwen Pounds, MDReason for consultation: Epidural spinal abscess aspiration	Source of information: Patient, medical record, and consulting providerHistory of Present Illness Carlos Caldwell is a 77 y.o. 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 No acute fracture or subluxation.  Follow-up MRI lumbar spine with IV contrast showed findings of L5-S1 septic facet joint arthritis, L5-S1 diskitis, L4 to S2 prominent circumferential epidural phlegmon and small septated abscesses.  Interventional Radiology was requested to evaluate for epidural spinal abscess aspirationPast Medical History Past Medical History: Diagnosis Date  BCC (basal cell carcinoma), ear, right   Patent foramen ovale   Peripheral neuropathy   Past Surgical History Past Surgical History: Procedure Laterality Date  COLONOSCOPY  07/2014  JOINT REPLACEMENT    b/l hips  ORTHOPEDIC SURGERY    ROTATOR CUFF REPAIR Bilateral   TOTAL HIP ARTHROPLASTY  2000  TOTAL HIP ARTHROPLASTY  2003  VASCULAR SURGERY    L carotid endarterectomy  Family History Family History Problem Relation Age of Onset  Heart failure Father       died CHF age 39  Social History Social History Tobacco Use  Smoking status: Never  Smokeless tobacco: Never Substance Use Topics  Alcohol use: No  He reports no history of drug use.  Inpatient Medications Outpatient Medications Current Facility-Administered Medications Medication Dose Route Frequency Provider Last Rate Last Admin  aspirin EC delayed release tablet 81 mg  81 mg Oral Daily Doristine Bosworth [place], [place]   81 mg at 12/08/22 0807  [START ON 12/09/2022] cefTRIAXone (ROCEPHIN) 2 g in sodium chloride 0.9% PF 20 mL (100 mg/mL)  2 g Intravenous Q24H Forschino, Koleen Nimrod, [place]      [START ON 12/09/2022] cetirizine (ZyrTEC) tablet 10 mg  10 mg Oral Daily Bosie Clos, MD      [START ON 12/09/2022] enoxaparin (LOVENOX) syringe 40 mg  40 mg Subcutaneous Daily Bosie Clos, MD      lidocaine 4 % topical patch 1 patch  1 patch Transdermal Q24H Forschino, Koleen Nimrod, PA-C      metroNIDAZOLE (FLAGYL) 500 mg/100 mL IVPB 500 mg  500 mg Intravenous Q8H Morris, Maisie Fus A, PA 200 mL/hr at 12/08/22 1851 500 mg at 12/08/22 1851  rosuvastatin (CRESTOR) tablet 10 mg  10 mg Oral Daily Berton Lan, PA   10 mg at 12/08/22 0807  vancomycin (VANCOCIN) 1.25 g in sodium chloride 0.9% 250 mL IVPB  1.25 g Intravenous Q12H Wonda Horner, PA 166.7 mL/hr at 12/08/22 1657 1.25 g at 12/08/22 1657  [START ON 12/09/2022] Vancomycin MAR Level   Intravenous Once Forschino, Koleen Nimrod, PA-C     cyclobenzaprine, diazePAM, ketorolac, zolpidem Medications Prior to Admission Medication Sig Dispense Refill Last Dose  aspirin 81 MG EC tablet Take 1 tablet (81 mg total) by mouth daily.     cholecalciferol (VITAMIN D) 1,000 unit tablet Take 1 tablet (1,000 Units total) by mouth daily.     cyclobenzaprine (FLEXERIL) 10 mg tablet Take 1 tablet (10 mg total) by mouth 3 (three) times daily as needed for muscle spasms for up to 10 days. 30 tablet 0   fluticasone propionate (  FLONASE) 50 mcg/actuation nasal spray SHAKE LIQUID AND USE 1 SPRAY IN EACH NOSTRIL DAILY 48 g 2   lidocaine (LIDODERM) 5 % Place 1 patch onto the skin every 24 hours. Remove & Discard patch within 12 hours or as directed by MD 30 patch 0   loratadine (CLARITIN) 10 mg tablet Take 1 tablet (10 mg total) by mouth.     Miscellaneous Medical Supply Use as directed.  Patient needs customize bilateral AFO braces.Dx G.60.0, R26.9, R23.8 1 each 0   multivitamin capsule Take 1 capsule by mouth daily.     naproxen sodium (ANAPROX) 220 MG tablet Take 1 tablet (220 mg total) by mouth 2 (two) times daily with breakfast and dinner.     potassium citrate (UROCIT-K) 10 mEq (1,080 mg) extended release tablet      predniSONE (DELTASONE) 10 mg tablet 3 tabs x 4days, 2 tabs x 4days 1 tabs x 4day, then 0.5 tab x 4 days. Take with food 32 tablet 0   sildenafiL (VIAGRA) 100 mg tablet TAKE 1 TABLET(100 MG) BY MOUTH DAILY AS NEEDED FOR ERECTILE DYSFUNCTION 18 tablet 2   simvastatin (ZOCOR) 10 mg tablet TAKE 1 TABLET(10 MG) BY MOUTH EVERY NIGHT 90 tablet 3   zolpidem (AMBIEN) 10 mg tablet Take 1 tablet (10 mg total) by mouth nightly as needed.. 90 tablet 1   Allergies No Known Allergies Subjective Data ROSObjective Data Physical ExamVitals:  12/08/22 1138 12/08/22 1139 12/08/22 1455 12/08/22 1933 BP: (!) 152/92  (!) 164/83 (!) 165/81 Pulse:   (!) 94 (!) 94 Resp: 17  20  Temp:   97.4 ?F (36.3 ?C)  TempSrc:   Oral  SpO2: 96% 96% 94%  Weight:     Height:     Laboratory ResultsChemistry:Recent Labs Lab 08/14/241145 NA 134* K 4.2 CL 97* CO2 28 BUN 23 CREATININE 0.82 Complete Blood Count:Recent Labs Lab 08/14/241145 WBC 20.9* HGB 16.3 HCT 48.80 PLT 195 Liver Function Tests:No results for input(s): AST, ALT, ALKPHOS, BILITOT in the last 168 hours.Coagulation Studies:No results for input(s): PTT, LABPROT, INR in the last 168 hours.Microbiology:Recent Labs Lab 08/15/240604 LABBLOO No Growth to Date Pertinent ImagingMRI Lumbar Spine with and without IV ContrastResult Date: 8/15/2024Abnormal signal and enhancement compatible with bilateral L5-S1 septic facet joint arthritis, with mild signal abnormality and enhancement about the L5-S1 endplates also suggestive of discitis. There is prominent circumferential epidural phlegmon and small septated abscesses, in the lower lumbar spine spanning from L4 through S1-S2 levels resulting in severe spinal canal stenosis. Recommend urgent spine surgery consultation. Above findings were communicated to and acknowledged by Posey Pronto at 12/08/2022 1:56 AM. [facility] Radiology Notify System Classification: Clinical team aware. Reported and signed by: Dion Saucier, MD  [facility] Radiology and Biomedical Imaging CXRResult Date: 8/14/2024No acute findings. [facility] Radiology Notify System Classification:  Routine. 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 No acute fracture or subluxation. [facility] Radiology Notify System Classification: Routine. Report initiated by:  Catha Brow, MD Reported and signed by: Bradly Bienenstock, MD  [facility] Radiology and Biomedical Imaging  Assessment and Plan AssessmentWilliam Caldwell is a 77 y.o. male with a history of bilateral hip replacements, nephrolithiasis, basal cell carcinoma status post multiple resections superficially, peripheral neuropathy with bilateral Charcot Hilda Lias Tooth feet status post AFO braces, presenting with back pain for past 2 weeks. 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 No acute fracture or subluxation.  Follow-up MRI lumbar spine with IV contrast showed findings of L5-S1 septic facet joint arthritis, L5-S1 diskitis, L4 to S2 prominent circumferential epidural phlegmon and small septated abscesses. Review of MRI demonstrating predominant posterior epidural phlegmon extending from L5 to S1-S2 level. There's very small collection if any which is not visible on [place] lumbar spine. This makes it technically difficult to target the site for any possible [place] procedure. Therefore, IR recommends no  active intervention. Please continue medical management, and repeat imaging in a week if clinically warranted. *Please note that procedure times as indicated in Epic are placeholders and do not necessarily reflect when the procedure will be performed. Thank you for involving Interventional Radiology in the care of your patient. Please text or call my Mobile Heart Beat with any questions or concerns.With urgent questions or concerns, please contact IR at: Inpatient -For [facility]: Page  INTERVENTIONAL RADIOLOGY CONSULT - IR - VASCULAR [facility] via Smart Web-For [facility]: Page  INTERVENTIONAL RADIOLOGY CONSULT - IR - VASCULAR [facility] via Smart Web- For [employer]: Use pager [phone] Outpatient (phone, all locations) - [phone] (IRIR)-----------------------------------------------Makela Niehoff, MDPGY-6, Interventional RadiologyMHB: [phone]

## 2022-12-09 NOTE — Treatment Summary
I was called to evaluate patient after an extravasation of gadolinium based contrast from patient's left forearm peripheral IV injection site. At the time of my evaluation there was a small soft bump at the site of extravasation. The forearm was compressible. The radial and ulnar pulses were palpable at the wrist. The patient displayed a normal sensory and motor exam. Clair Gulling MD8/16/2024 6:16 AM

## 2022-12-09 NOTE — Plan of Care
Plan of Care Overview/ Patient Status    Problem: Adult Inpatient Plan of CareGoal: Readiness for Transition of CareOutcome: Interventions implemented as appropriatePer H&P, Carlos Caldwell is a 77 y.o. 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 Met with patient at bedside and reviewed Care Management role. Patient verbalized understanding. Patient resides with spouse in single family home. Has a cane and BLE AFO braces. No HCS. Was attending outpt PT thru [facility] in [place]. Needs TBD. Family to transport upon DC.Case Management Screening and Evaluation  Flowsheet Row Most Recent Value Case Management Screening: Chart review completed. If YES to any question below then proceed to CM Eval/Plan  Is there a change in their cognitive function No Do you anticipate that the pt will have any discharge needs requiring CM intervention? Yes Has there been a readmission within the last 30 days and/or four (4) encounters (encounters include: ED, OBS, Inpatient) within the last six (6) months? No Were there services prior to admission ( Examples: Assisted Living, HD, Homecare, Extended Care Facility, Methadone, SNF, Outpatient Infusion Center) No Negative/Positive Screen Positive Screening: Complete CM Evaluation and Plan Case manager will take lead to arrange post-acute care services and continue to work with the care team as the patient progresses towards discharge Yes Case Manager Attestation  I have reviewed the medical record and completed the above screen. CM staff will follow patient's progress and discuss the plan of care with the Treatment Team. Yes Case Management Evaluation and Plan  Arrived from prior to admission home/apartment/condo  Lives With Spouse Services Prior to Admission none Patient Requires Care Coordination Intervention Due To discharge planning needs/concerns Prior to Hospitalization: Assistance Needed/DME being used Ambulation  [BLE AFOs] Ambulation Assistance/DME: straight cane Documented Insurance Accurate Yes Any financial concerns related to anticipated discharge needs No Patient's home address verified Yes Patient's PCP of record verified Yes Last Date Seen by PCP 0-3 months Source of Clinical History  Patient's clinical history has been reviewed and source of Information is: Patient, Medical record Case Manager Attestation  I have reviewed the medical record and completed the above evaluation with the following recommendations. Yes Discharge Planning Coordination Recommendations  Discharge Planning Coordination Recommendations Home with MD follow-up/No needs identified Case Manager reviewed plan of care/ continuum of care need's with  Patient, Interdisciplinary Team, Transitional care rounds   Care manager will continue to follow patient as he progresses towards discharge.Bailey Mech, RNCare Management Department [facility]

## 2022-12-10 LAB — CBC WITH AUTO DIFFERENTIAL
BKR WAM ABSOLUTE IMMATURE GRANULOCYTES.: 0.08 x 1000/ÂµL (ref 0.00–0.30)
BKR WAM ABSOLUTE LYMPHOCYTE COUNT.: 0.89 x 1000/ÂµL (ref 0.60–3.70)
BKR WAM ABSOLUTE NRBC (2 DEC): 0 x 1000/ÂµL (ref 0.00–1.00)
BKR WAM ANC (ABSOLUTE NEUTROPHIL COUNT): 7.54 x 1000/ÂµL (ref 2.00–7.60)
BKR WAM BASOPHIL ABSOLUTE COUNT.: 0.04 x 1000/ÂµL (ref 0.00–1.00)
BKR WAM BASOPHILS: 0.4 % (ref 0.0–1.4)
BKR WAM EOSINOPHIL ABSOLUTE COUNT.: 0.15 x 1000/ÂµL (ref 0.00–1.00)
BKR WAM EOSINOPHILS: 1.6 % (ref 0.0–5.0)
BKR WAM HEMATOCRIT (2 DEC): 50 % (ref 38.50–50.00)
BKR WAM HEMOGLOBIN: 16.7 g/dL (ref 13.2–17.1)
BKR WAM IMMATURE GRANULOCYTES: 0.8 % (ref 0.0–1.0)
BKR WAM LYMPHOCYTES: 9.4 % — ABNORMAL LOW (ref 17.0–50.0)
BKR WAM MCH (PG): 31.6 pg (ref 27.0–33.0)
BKR WAM MCHC: 33.4 g/dL (ref 31.0–36.0)
BKR WAM MCV: 94.5 fL (ref 80.0–100.0)
BKR WAM MONOCYTE ABSOLUTE COUNT.: 0.77 x 1000/ÂµL (ref 0.00–1.00)
BKR WAM MONOCYTES: 8.1 % (ref 4.0–12.0)
BKR WAM MPV: 9.6 fL (ref 8.0–12.0)
BKR WAM NEUTROPHILS: 79.7 % — ABNORMAL HIGH (ref 39.0–72.0)
BKR WAM NUCLEATED RED BLOOD CELLS: 0 % (ref 0.0–1.0)
BKR WAM PLATELETS: 212 x1000/ÂµL (ref 150–420)
BKR WAM RDW-CV: 13 % (ref 11.0–15.0)
BKR WAM RED BLOOD CELL COUNT.: 5.29 M/ÂµL (ref 4.00–6.00)
BKR WAM WHITE BLOOD CELL COUNT: 9.5 x1000/ÂµL (ref 4.0–11.0)

## 2022-12-10 LAB — BASIC METABOLIC PANEL
BKR ANION GAP: 10 (ref 7–17)
BKR BLOOD UREA NITROGEN: 21 mg/dL (ref 8–23)
BKR BUN / CREAT RATIO: 25 — ABNORMAL HIGH (ref 8.0–23.0)
BKR CALCIUM: 9 mg/dL (ref 8.8–10.2)
BKR CHLORIDE: 101 mmol/L (ref 98–107)
BKR CO2: 24 mmol/L (ref 20–30)
BKR CREATININE: 0.84 mg/dL (ref 0.40–1.30)
BKR EGFR, CREATININE (CKD-EPI 2021): >60 mL/min/{1.73_m2} (ref >=60)
BKR GLUCOSE: 96 mg/dL (ref 70–100)
BKR POTASSIUM: 4.3 mmol/L (ref 3.3–5.3)
BKR SODIUM: 135 mmol/L — ABNORMAL LOW (ref 136–144)

## 2022-12-10 NOTE — Consults
[facility] Interventional RadiologyConsultation Information Interventional Radiology consultation requested by: Hollace Hayward May, MDReason for consultation: vertebral body biopsySource of information: Patient, medical record, and consulting providerHistory of Present Illness Carlos Caldwell is a 77 y.o. 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 IR  previously consulted for spinal epidural  abscess/phlegmon aspiration; however, collection was deemed to be too small for aspiration. IR now re-consulted for vertebral body biopsy to guide treatment.  Interventional Radiology was requested to evaluate for vertebral body biopsy. Past Medical History Past Medical History: Diagnosis Date  BCC (basal cell carcinoma), ear, right   Patent foramen ovale   Peripheral neuropathy   Past Surgical History Past Surgical History: Procedure Laterality Date  COLONOSCOPY  07/2014  JOINT REPLACEMENT    b/l hips  ORTHOPEDIC SURGERY    ROTATOR CUFF REPAIR Bilateral   TOTAL HIP ARTHROPLASTY  2000  TOTAL HIP ARTHROPLASTY  2003  VASCULAR SURGERY    L carotid endarterectomy  Family History Family History Problem Relation Age of Onset  Heart failure Father       died CHF age 67  Social History Social History Tobacco Use  Smoking status: Never  Smokeless tobacco: Never Substance Use Topics  Alcohol use: No  He reports no history of drug use.  Inpatient Medications Outpatient Medications Current Facility-Administered Medications Medication Dose Route Frequency Provider Last Rate Last Admin  aspirin EC delayed release tablet 81 mg  81 mg Oral Daily Doristine Bosworth [place], [place]   81 mg at 12/10/22 0802  cefTRIAXone (ROCEPHIN) 2 g in sodium chloride 0.9% PF 20 mL (100 mg/mL)  2 g Intravenous Q24H Forschino, Koleen Nimrod, PA-C   2 g at 12/10/22 2130  cetirizine (ZyrTEC) tablet 10 mg  10 mg Oral Daily Bosie Clos, MD   10 mg at 12/10/22 0802  enoxaparin (LOVENOX) syringe 40 mg  40 mg Subcutaneous Daily Bosie Clos, MD   40 mg at 12/10/22 0802  lidocaine 4 % topical patch 1 patch  1 patch Transdermal Q24H Forschino, Koleen Nimrod, PA-C      metroNIDAZOLE (FLAGYL) tablet 500 mg  500 mg Oral Q8H Bosie Clos, MD   500 mg at 12/10/22 0527  polyethylene glycol (MIRALAX) packet 17 g  17 g Oral Daily Bosie Clos, MD      rosuvastatin (CRESTOR) tablet 10 mg  10 mg Oral Daily Doristine Bosworth [place], [place]   10 mg at 12/10/22 0802  senna (SENOKOT) tablet 17.2 mg  2 tablet Oral Nightly Bosie Clos, MD      vancomycin (VANCOCIN) 1.25 g in sodium chloride 0.9% 250 mL IVPB  1.25 g Intravenous Q12H Posey Pronto A, PA 166.7 mL/hr at 12/10/22 0250 1.25 g at 12/10/22 0250 acetaminophen, cyclobenzaprine, ketorolac, oxyCODONE, zolpidem Medications Prior to Admission Medication Sig Dispense Refill Last Dose  aspirin 81 MG EC tablet Take 1 tablet (81 mg total) by mouth daily.     cholecalciferol (VITAMIN D) 1,000 unit tablet Take 1 tablet (1,000 Units total) by mouth daily.     [EXPIRED] cyclobenzaprine (FLEXERIL) 10 mg tablet Take 1 tablet (10 mg total) by mouth 3 (three) times daily as needed for muscle spasms for up to 10 days. 30 tablet 0   fluticasone propionate (FLONASE) 50 mcg/actuation nasal spray SHAKE LIQUID AND USE 1 SPRAY IN EACH NOSTRIL DAILY 48 g 2   lidocaine (LIDODERM) 5 % Place 1 patch onto the skin every  24 hours. Remove & Discard patch within 12 hours or as directed by MD 30 patch 0   loratadine (CLARITIN) 10 mg tablet Take 1 tablet (10 mg total) by mouth.     Miscellaneous Medical Supply Use as directed.  Patient needs customize bilateral AFO braces.Dx G.60.0, R26.9, R23.8 1 each 0   multivitamin capsule Take 1 capsule by mouth daily. naproxen sodium (ANAPROX) 220 MG tablet Take 1 tablet (220 mg total) by mouth 2 (two) times daily with breakfast and dinner.     potassium citrate (UROCIT-K) 10 mEq (1,080 mg) extended release tablet      predniSONE (DELTASONE) 10 mg tablet 3 tabs x 4days, 2 tabs x 4days 1 tabs x 4day, then 0.5 tab x 4 days. Take with food 32 tablet 0   sildenafiL (VIAGRA) 100 mg tablet TAKE 1 TABLET(100 MG) BY MOUTH DAILY AS NEEDED FOR ERECTILE DYSFUNCTION 18 tablet 2   simvastatin (ZOCOR) 10 mg tablet TAKE 1 TABLET(10 MG) BY MOUTH EVERY NIGHT 90 tablet 3   zolpidem (AMBIEN) 10 mg tablet Take 1 tablet (10 mg total) by mouth nightly as needed.. 90 tablet 1   Allergies No Known Allergies Objective Data Physical ExamVitals:  12/09/22 1840 12/09/22 1940 12/10/22 0003 12/10/22 0748 BP: (!) 179/93 (!) 159/76 (!) 177/99 (!) 161/85 Pulse: 73  73 80 Resp: 19  20 18  Temp:   97.4 ?F (36.3 ?C) 98.2 ?F (36.8 ?C) TempSrc:   Oral Oral SpO2: 96%  94% 95% Weight:    102.1 kg Height:     Laboratory ResultsChemistry:Recent Labs Lab 08/14/241145 08/16/241014 08/17/240516 NA 134* 137 135* K 4.2 4.2 4.3 CL 97* 101 101 CO2 28 26 24  BUN 23 24* 21 CREATININE 0.82 0.80 0.84 Complete Blood Count:Recent Labs Lab 08/14/241145 08/16/241014 08/17/240516 WBC 20.9* 8.5 9.5 HGB 16.3 15.8 16.7 HCT 48.80 49.00 50.00 PLT 195 189 212 Liver Function Tests:No results for input(s): AST, ALT, ALKPHOS, BILITOT in the last 168 hours.Coagulation Studies:No results for input(s): PTT, LABPROT, INR in the last 168 hours.Microbiology:Recent Labs Lab 08/15/240604 LABBLOO No Growth to Date Pertinent ImagingMRI Cervical Spine with and without IV ContrastResult Date: 8/16/2024MRI CERVICAL SPINE W WO IV CONTRAST, MRI THORACIC SPINE W WO IV CONTRAST INDICATION: Concern for infection. COMPARISON: None. TECHNIQUE: Multiplanar multisequence magnetic resonance imaging of the cervical spine and thoracic spine was performed before and after administration of  intravenous contrast. IV CONTRAST:  19 mL of Dotarem. FINDINGS: Evaluation is mildly limited by motion artifact on multiple sequences. CERVICAL SPINE: Visualized posterior fossa and craniocervical junction are unremarkable. Loss of the cervical lordosis. Trace multilevel listhesis is presumably degenerative. Vertebral bodies maintain normal height. Multilevel intervertebral disc desiccation and loss of disc height. Multilevel degenerative endplate changes. No aggressive marrow replacing lesion. No abnormal signal or postcontrast enhancement is seen to suggest discitis/osteomyelitis. Prevertebral and paraspinal soft tissues are unremarkable. The cervical spinal cord is normal in caliber and signal intensity. No cord compression. Multilevel posterior disc osteophyte complexes resulting in multilevel mild spinal canal stenosis, without cord compression or cord signal abnormality. Varying degrees of multilevel foraminal stenosis, suboptimally assessed due to motion artifact. Visualized extraspinal soft tissues are unremarkable. THORACIC SPINE: Normal thoracic kyphosis. Trace anterolisthesis of T11 on T12 and T10 on T11. There is also trace anterolisthesis of T1 on T2 and T2 on T3. These are presumably degenerative. Remainder of the thoracic vertebrae maintain normal alignment. Multilevel intervertebral disc desiccation and disc height narrowing. Multilevel degenerative endplate changes. Presumably degenerative partial intervertebral fusion at T12-L1 level.  No abnormal signal or postcontrast enhancement is seen to suggest discitis/osteomyelitis. Prevertebral and paraspinal soft tissues are unremarkable. At T10-11 level, trace anterolisthesis with circumferential disc bulge and ligamentum flavum thickening/ligament of flavum calcifications and posterior epidural lipomatosis, result in severe spinal canal stenosis with complete effacement of intrathecal CSF compressing upon the spinal cord. No definite cord signal abnormality seen. At T9-10 level, circumferential disc bulge, facet joint arthrosis and ligamentum flavum thickening result in moderate spinal canal stenosis. Mild bilateral foraminal stenosis. At T11-12, there is trace anterolisthesis with mild disc bulge. Bilateral facet joint arthrosis and ligamentum flavum thickening. This results in mild-to-moderate spinal canal stenosis. Moderate right foraminal stenosis. No significant left foraminal stenosis. Mild disc bulges and small disc protrusions at other levels in the remainder of the thoracic spine, without significant spinal canal or foraminal stenosis seen. Mild dural enhancement/epidural venous congestion in the lower thoracic spine spanning T9-T11. There is also mild posterior dural enhancement seen at L1/L2 levels. Partially imaged right renal cyst. Partially imaged nonobstructing right renal calculus. Remainder of the visualized extraspinal structures are unremarkable.   No evidence of discitis/osteomyelitis in the cervical or thoracic spine. Multilevel degenerative changes in the cervical spine, without significant spinal canal stenosis. Multilevel degenerative changes in the thoracic spine resulting in severe spinal canal stenosis at T10-11 level, resulting in mild cord compression. No definite cord signal abnormality, within limitations of motion artifact. Moderate spinal canal stenosis at T9-10 and T11-12 levels. Nonspecific mild dural/epidural enhancement in the lower thoracic spine at T9-T11 levels, probably from epidural venous congestion, could be reactive to infection/epidural abscess in the lower lumbar spine. No epidural collection or abscess is seen. [facility] Radiology Notify System Classification: Urgent result. Reported and signed by: Dion Saucier, MD  [facility] Radiology and Biomedical Imaging MRI Thoracic Spine with and without IV ContrastResult Date: 8/16/2024MRI CERVICAL SPINE W WO IV CONTRAST, MRI THORACIC SPINE W WO IV CONTRAST INDICATION: Concern for infection. COMPARISON: None. TECHNIQUE: Multiplanar multisequence magnetic resonance imaging of the cervical spine and thoracic spine was performed before and after administration of  intravenous contrast. IV CONTRAST:  19 mL of Dotarem. FINDINGS: Evaluation is mildly limited by motion artifact on multiple sequences. CERVICAL SPINE: Visualized posterior fossa and craniocervical junction are unremarkable. Loss of the cervical lordosis. Trace multilevel listhesis is presumably degenerative. Vertebral bodies maintain normal height. Multilevel intervertebral disc desiccation and loss of disc height. Multilevel degenerative endplate changes. No aggressive marrow replacing lesion. No abnormal signal or postcontrast enhancement is seen to suggest discitis/osteomyelitis. Prevertebral and paraspinal soft tissues are unremarkable. The cervical spinal cord is normal in caliber and signal intensity. No cord compression. Multilevel posterior disc osteophyte complexes resulting in multilevel mild spinal canal stenosis, without cord compression or cord signal abnormality. Varying degrees of multilevel foraminal stenosis, suboptimally assessed due to motion artifact. Visualized extraspinal soft tissues are unremarkable. THORACIC SPINE: Normal thoracic kyphosis. Trace anterolisthesis of T11 on T12 and T10 on T11. There is also trace anterolisthesis of T1 on T2 and T2 on T3. These are presumably degenerative. Remainder of the thoracic vertebrae maintain normal alignment. Multilevel intervertebral disc desiccation and disc height narrowing. Multilevel degenerative endplate changes. Presumably degenerative partial intervertebral fusion at T12-L1 level. No abnormal signal or postcontrast enhancement is seen to suggest discitis/osteomyelitis. Prevertebral and paraspinal soft tissues are unremarkable. At T10-11 level, trace anterolisthesis with circumferential disc bulge and ligamentum flavum thickening/ligament of flavum calcifications and posterior epidural lipomatosis, result in severe spinal canal stenosis with complete effacement of intrathecal CSF compressing upon the  spinal cord. No definite cord signal abnormality seen. At T9-10 level, circumferential disc bulge, facet joint arthrosis and ligamentum flavum thickening result in moderate spinal canal stenosis. Mild bilateral foraminal stenosis. At T11-12, there is trace anterolisthesis with mild disc bulge. Bilateral facet joint arthrosis and ligamentum flavum thickening. This results in mild-to-moderate spinal canal stenosis. Moderate right foraminal stenosis. No significant left foraminal stenosis. Mild disc bulges and small disc protrusions at other levels in the remainder of the thoracic spine, without significant spinal canal or foraminal stenosis seen. Mild dural enhancement/epidural venous congestion in the lower thoracic spine spanning T9-T11. There is also mild posterior dural enhancement seen at L1/L2 levels. Partially imaged right renal cyst. Partially imaged nonobstructing right renal calculus. Remainder of the visualized extraspinal structures are unremarkable.   No evidence of discitis/osteomyelitis in the cervical or thoracic spine. Multilevel degenerative changes in the cervical spine, without significant spinal canal stenosis. Multilevel degenerative changes in the thoracic spine resulting in severe spinal canal stenosis at T10-11 level, resulting in mild cord compression. No definite cord signal abnormality, within limitations of motion artifact. Moderate spinal canal stenosis at T9-10 and T11-12 levels. Nonspecific mild dural/epidural enhancement in the lower thoracic spine at T9-T11 levels, probably from epidural venous congestion, could be reactive to infection/epidural abscess in the lower lumbar spine. No epidural collection or abscess is seen. [facility] Radiology Notify System Classification: Urgent result. Reported and signed by: Dion Saucier, MD  [facility] Radiology and Biomedical Imaging Echo 2D Complete w Doppler and CFI if Ind Image Enhancement 3D and or bubblesResult Date: 12/08/2022 * Normal left ventricular cavity size.  Normal left ventricle wall thickness.  No regional wall motion abnormalities.  Normal left ventricular systolic function.  LVEF estimated by visual assessment was between 55-60%.  Normal diastolic function and filling pressures.  No thrombus visualized in the left ventricle. * Normal right ventricular cavity size and systolic function. * Left atrium is mildly dilated.  Right atrium is normal in size. * No significant valvular abnormalities. * No vegetation visualized on any cardiac valve on this average quality study. * All visible segments of the aorta are normal in size. * No significant pericardial effusion. * No prior study available for comparison.MRI Lumbar Spine with and without IV ContrastResult Date: 8/15/2024MRI LUMBAR SPINE W WO IV CONTRAST INDICATION: Low back pain, infection suspected, no prior imaging Concern for infection. COMPARISON: [place] LUMBAR SPINE WO IV CONTRAST 2022-12-07 TECHNIQUE: Multiplanar MRI of the lumbar spine was performed before and after administration of intravenous contrast. IV CONTRAST:  20 mL of Dotarem. FINDINGS: Vertebral numbering performed, assuming last fully formed disc as L5-S1. By this numbering system, S1 appears to be partially lumbarized. Please make careful attention to this vertebral numbering if there is surgical intervention. Grade 1 anterolisthesis of L4 on L5 and grade 1 retrolisthesis of L2 on L3 and L1 on L2, presumably degenerative. Lumbar vertebrae maintain normal height with no acute compression fracture. Chronic L4 anterosuperior limbus vertebra. There is bilateral L5-S1 facet joint effusions with abnormal postcontrast enhancement in the adjacent paraspinal soft tissues, compatible with septic facet joint arthritis. There is associated enhancing epidural phlegmon with septated epidural abscesses with central areas of nonenhancement involving the epidural space circumferentially spanning from L4-5 through S1-S2 levels. Largest abscess in the posterior epidural space at S1 level measures approximately 3.8 x 1.2 x 0.7 cm (image 10 of series 11), also demonstrating restricted diffusion. Epidural phlegmon and abscess is result in severe spinal canal stenosis in the lower lumbar spine from  L4 through S1-S2. There is also mild edema and enhancement about the L5-S1 endplates and mild hyperintense signal in the L5-S1 disc, which is also concerning for discitis/ostial myelitis. Trace prevertebral edema and enhancement is seen anterior to L5 vertebral body. No prevertebral fluid collection or abscess is seen. Chronic partial fatty atrophy in the right psoas muscle. There is also atrophy of the right iliacus, partially imaged. No abnormal enhancement, collection or abscesses in the psoas muscles. Multilevel degenerative endplate changes and a Schmorl's node at superior L3 endplate. No additional foci of osteomyelitis in the remainder of the lumbar spine. The conus terminates at L1-2 level, normal in morphology and signal. T12-L1: Partial degenerative vertebral fusion. No spinal canal or foraminal stenosis. L1-2: Trace retrolisthesis. Mild disc bulge. Mild posterior epidural lipomatosis. Mild narrowing of the spinal canal and bilateral neural foramina. L2-3: Trace retrolisthesis, with disc uncovering/mild bulge. Mild bilateral facet joint arthrosis. Mild spinal canal and mild bilateral neural foraminal stenosis. L3-4: Mild disc bulge. Mild bilateral facet joint arthrosis. No spinal canal stenosis. Mild bilateral foraminal stenosis. L4-5: Trace anterolisthesis. Mild disc bulge. Bilateral facet joint arthrosis. Degenerative changes with superimpose infectious epidural phlegmon/abscesses resulting severe spinal canal stenosis. Moderate bilateral foraminal stenosis. L5-S1: Mild disc bulge. 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 Infectious/inflammatory enhancement also extends to the bilateral L5-S1 neural foramina Atherosclerotic changes in the abdominal aorta, without aneurysmal dilatation seen. A T2 hyperintense exophytic right renal cyst. A 1.8 cm hypointense nonobstructing right renal calculus is seen. Abnormal signal and enhancement compatible with bilateral L5-S1 septic facet joint arthritis, with mild signal abnormality and enhancement about the L5-S1 endplates also suggestive of discitis. There is prominent circumferential epidural phlegmon and small septated abscesses, in the lower lumbar spine spanning from L4 through S1-S2 levels resulting in severe spinal canal stenosis. Recommend urgent spine surgery consultation. Above findings were communicated to and acknowledged by Posey Pronto at 12/08/2022 1:56 AM. [facility] Radiology Notify System Classification: Clinical team aware. Reported and signed by: Dion Saucier, MD  [facility] Radiology and Biomedical Imaging [place] Date: 8/14/2024EXAM: XR CHEST PA AND LATERAL INDICATION: LEUKOCYTOSIS COMPARISON: NONE FINDINGS: Support Devices: None Heart/Mediastinum: Normal. Lungs/Pleura: No focal consolidation. No effusion or pneumothorax. Bones: No acute findings. No acute findings. [facility] Radiology Notify System Classification:  Routine. Reported and signed by: Bradly Bienenstock, MD  [facility] Radiology and Biomedical Imaging [employer] Lumbar Spine wo IV ContrastResult Date: 8/14/2024CT LUMBAR SPINE WO IV CONTRAST performed on 12/07/2022 12:15 PM INDICATION: Low back pain, symptoms persist with > 6 wks treatment low back pain radiating down leg, xray done on 12/03/2022. COMPARISON: XR LUMBAR SPINE AP AND LATERAL 2022-11-26 TECHNIQUE:   of the lumbar spine was performed without contrast. Sagittal and coronal reformats were obtained. FINDINGS: There is no acute fracture or subluxation of the lumbar spine. The vertebral body heights are grossly maintained. There are multilevel degenerative changes including intervertebral disc space narrowing with vacuum disc phenomenon, endplate sclerosis and osteophytosis, and facet arthropathy, as well as partial fusion of the T12-L1 vertebral bodies. Again seen is mild retrolisthesis of L1 on L2, L2 on L3, and L3 on L4 and mild grade 1 anterolisthesis of L4 on L5, likely degenerative in nature.  There is severe left-sided neural foraminal stenosis at L5-S1. Moderate neural foraminal stenosis is seen on the right. Several right-sided nonobstructive renal calculi are appreciated, with the largest calculus measuring up to 1.6 cm in the right upper pole. A right  upper pole renal cyst is present. Evaluation of the pelvic organs is limited secondary to streak artifact from bilateral hip arthroplasties. Note is made of extensive atherosclerotic calcifications in the abdominal aorta and common iliac vessels.  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 No acute fracture or subluxation. [facility] Radiology Notify System Classification: Routine. Report initiated by:  Catha Brow, MD Reported and signed by: Bradly Bienenstock, MD  [facility] Radiology and Biomedical Imaging XR Lumbar Spine 2VResult Date: 8/3/2024Study: XR LUMBAR SPINE AP AND LATERAL INDICATION: Lower back pain COMPARISON: None FINDINGS: Five non-rib-bearing, lumbar-type vertebra are visualized. There is mild retrolisthesis of L1 relative to L2, retrolisthesis of L3 relative to L4, anterolisthesis of L4 on L5, and anterolisthesis of L5 relative to S1. This is likely on degenerative basis. Vertebral body heights are maintained. No evidence of fracture. There are multilevel degenerative changes, as evidenced by endplate sclerosis, disc space narrowing, facet arthropathy, and marginal osteophyte formation. The sacroiliac joints are unremarkable. Bilateral total hip arthroplasty. Calcification of the abdominal aorta. Multilevel degenerative changes of the lumbar spine. Report initiated by:  Alvy Beal, MD Reported and signed by: Lavera Guise, MD  [facility] Radiology and Biomedical Imaging   Assessment and Plan AssessmentWilliam Dirico is a 77 y.o. 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 On review of MRI of the Lumbar Spine at the level of L4-S1, there does not appear to be radiographic evidence of vertebral body inflammation or edema that would be amenable to biopsy. Therefore, IR does not recommend vertebral body biopsy at this time. Thank you for involving Interventional Radiology in the care of your patient. Please text or call my Mobile [employer] with any questions or concerns.With urgent questions or concerns, please contact IR at: Inpatient -For [facility]: Page  INTERVENTIONAL RADIOLOGY CONSULT - IR - VASCULAR [facility] via Smart Web-For [facility]: Page  INTERVENTIONAL RADIOLOGY CONSULT - IR - VASCULAR [facility] via Smart Web- For [employer]: Use pager [phone] Outpatient (phone, all locations) - [phone] Archer Asa, MD 12/10/2022

## 2022-12-10 NOTE — Progress Notes
[facility]	 [facility]	Progress NoteAttending Provider: Gwen Pounds, MDHospital Day:  2ID Statement:  Carlos Caldwell is a 77 y.o. 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, HLD, presenting with two weeks of worsening lower back pain, with concern for spinal epidural abscess/phlegmon.Subjective/Interval Events:  Overnight:- NAEONToday:- Overall no complaints this morningMeds: Scheduled Meds:Current Facility-Administered Medications Medication Dose Route Frequency Provider Last Rate Last Admin  aspirin EC delayed release tablet 81 mg  81 mg Oral Daily Doristine Bosworth [place], [place]   81 mg at 12/10/22 0802  cefTRIAXone (ROCEPHIN) 2 g in sodium chloride 0.9% PF 20 mL (100 mg/mL)  2 g Intravenous Q24H Forschino, Koleen Nimrod, PA-C   2 g at 12/10/22 0347  cetirizine (ZyrTEC) tablet 10 mg  10 mg Oral Daily Bosie Clos, MD   10 mg at 12/10/22 0802  enoxaparin (LOVENOX) syringe 40 mg  40 mg Subcutaneous Daily Bosie Clos, MD   40 mg at 12/10/22 0802  lidocaine 4 % topical patch 1 patch  1 patch Transdermal Q24H Forschino, Koleen Nimrod, PA-C      metroNIDAZOLE (FLAGYL) tablet 500 mg  500 mg Oral Q8H Bosie Clos, MD   500 mg at 12/10/22 0527  polyethylene glycol (MIRALAX) packet 17 g  17 g Oral Daily Bosie Clos, MD      rosuvastatin (CRESTOR) tablet 10 mg  10 mg Oral Daily Doristine Bosworth [place], [place]   10 mg at 12/10/22 0802  senna (SENOKOT) tablet 17.2 mg  2 tablet Oral Nightly Bosie Clos, MD      vancomycin (VANCOCIN) 1.25 g in sodium chloride 0.9% 250 mL IVPB  1.25 g Intravenous Q12H Posey Pronto A, PA 166.7 mL/hr at 12/10/22 0250 1.25 g at 12/10/22 0250 Objective: Vitals:Temp:  [97.4 ?F (36.3 ?C)-98.2 ?F (36.8 ?C)] 98.2 ?F (36.8 ?C)Pulse:  [73-80] 80Resp:  [18-20] 18BP: (150-179)/(76-99) 161/85SpO2:  [94 %-96 %] 95 %Device (Oxygen Therapy): room air I/O's:Intake/Output Summary (Last 24 hours) at 12/10/2022 0805Last data filed at 12/10/2022 0003Gross per 24 hour Intake -- Output 1200 ml Net -1200 ml  Physical Exam:General: NAD, lying in bedHEENT: NCATCardiac: RRR, no murmursPulmonary: CTAB, normal WOB on RAAbdomen: Soft NTNDMSK: No peripheral edemaDerm: No rashes or skin lesions noted.Neuro/Psych: Answering all questions appropriatelyLabs: Recent Labs Lab 08/14/241145 08/16/241014 08/17/240516 WBC 20.9* 8.5 9.5 HGB 16.3 15.8 16.7 HCT 48.80 49.00 50.00 PLT 195 189 212  Recent Labs Lab 08/14/241145 08/16/241014 08/17/240516 NEUTROPHILS 91.2* 79.8* 79.7*  Recent Labs Lab 08/14/241145 08/16/241014 08/17/240516 NA 134* 137 135* K 4.2 4.2 4.3 CL 97* 101 101 CO2 28 26 24  BUN 23 24* 21 CREATININE 0.82 0.80 0.84 GLU 116* 120* 96 ANIONGAP 9 10 10   Recent Labs Lab 08/14/241145 08/16/241014 08/17/240516 CALCIUM 9.3 8.9 9.0  No results for input(s): ALT, AST, ALKPHOS, BILITOT, BILIDIR in the last 168 hours. No results for input(s): PTT, LABPROT, INR in the last 168 hours. Microbiology:Blood culture (8/16): NGTDMRSA: NegativeImaging:MRI Cervical/Thoracic spineDate: 12/10/2022:No evidence of discitis in C/T spine.MRI Lumbar Spine with and without IV Contrast ResultDate: 8/15/2024Abnormal signal and enhancement compatible with bilateral L5-S1 septic facet joint arthritis, with mild signal abnormality and enhancement about the L5-S1 endplates also suggestive of discitis. There is prominent circumferential epidural phlegmon and small septated abscesses, in the lower lumbar spine spanning from L4 through S1-S2 levels resulting in severe spinal canal stenosis. Recommend urgent spine surgery consultation. CXRResult Date: 8/14/2024No acute findings. [place] Lumbar Spine wo IV ContrastResult Date: 8/14/2024Multilevel degenerative disease of the lumbar spine with severe left-sided neural foraminal stenosis  at L5-S1. No acute fracture or subluxation. ECG/Tele Events:  * Normal left ventricular cavity size.  Normal left ventricle wall thickness.  No regional wall motion abnormalities.  Normal left ventricular systolic function.  LVEF estimated by visual assessment was between 55-60%.  Normal diastolic function and filling pressures.  No thrombus visualized in the left ventricle.* Normal right ventricular cavity size and systolic function.* Left atrium is mildly dilated.  Right atrium is normal in size.* No significant valvular abnormalities.* No vegetation visualized on any cardiac valve on this average quality study.* All visible segments of the aorta are normal in size.* No significant pericardial effusion.* No prior study available for comparison.Assessment & Plan: Carlos Caldwell is a 77 y.o. 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 Unclear what the source is - possibly SSTI from chronic foot wounds? Exam reassuring for no spinal cord compression. TTE without evidence of endocarditis.- ID consulted and following; appreciate recs- c/w CTX/flagyl/vanc- IR deferred tissue collection given small collections; will reach out to them re: bone biopsy to guide abx treatment- c/w tylenol, ketorolac; adding on low dose oxycodone for pain control- likely PICC consult on monday Chronic Stable Problems:#HLD: c/w rosuvastatin#Allergies: c/w zyrtecDIET: RegularPPX: lovenoxACCESS: PIVDISPO:  FloorCODE STATUS: FULL CODEElectronically Signed:Stephens Shreve Threasa Beards, MDInternal Medicine, PGY-3Available on MHB

## 2022-12-10 NOTE — Progress Notes
Orthopaedic Spine Progress NoteAdmit date: 12/07/2022, Hospital Day: 4 Dx: L4-S2 posterial spinal epidural collectionbilateral L5-S1 septic facet joint arthritis, L5-S1 discitisDOI: 12/07/2022, Subjective: low back painPhysical Exam:His vital signs today were Blood pressure (!) 161/85, pulse 80, temperature 98.2 ?F (36.8 ?C), temperature source Oral, resp. rate 18, height 6' 1 (1.854 m), weight 102.1 kg, SpO2 95 %.. Gen: Awake, alert, NAD Lung: unlabored breathing on NC/RASkin intactminimal tenderness to palpation over spinous processesNo appreciable step offsNo scoliosis grosslyMotor:   R L C5 (Shoulder abd) 5 /5 5 /5 C5 (Elbow Flex) 5 /5 5/5 C6 (wrist ext) 5 /5 5 /5 C7 (Elbow ext) 5 /5 5 /5 C8 (Finger flex) 5 /5 5 /5 T1 (Hand intrinsics) 5 /5 5 /5   R L L2 (Hip flexors) 5 /5 5 /5 L3 (Knee ext) 5 /5 5 /5 L4(Tibialis ant) 5 /5 5 /5 L5 (EHL) 5 /5 5 /5 S1 (Gastroc) 5 /5 5 /5  Sensation:  R L C5 (Lateral arm) + + C6 (Thumb) + + C7 (Long finger) + + C8 (Small finger) + + T1 (Medial elbow) + +    R L L2 (Medial thigh) + + L3 (Medial knee) + + L4 (Medial ankle) + + L5 (1st web space) + + S1 (Lateral heel) + +  Reflexes:  R L C5 (Biceps) ++ ++ C6 (Brachioradialis) ++ ++ C7 (Triceps) ++ ++ L4 (Patellar) ++ ++ S1 (Achilles) ++ ++  TENSION SIGNS Negative straight-leg raiseNegative crossed straight-leg raiseUMN SIGNSNegative Hoffman's No evidence of clonus Rectal tone: intactPerineal sensation: intactImaging: Abnormal signal and enhancement compatible with bilateral L5-S1 septic facet joint arthritis, with mild signal abnormality and enhancement about the L5-S1 endplates also suggestive of discitis. There is prominent circumferential epidural phlegmon and small septated abscesses, in the lower lumbar spine spanning from L4 through S1-S2 levels resulting in severe spinal canal stenosis Labs:Lab Results Component Value Date  WBC 9.5 12/10/2022  HGB 16.7 12/10/2022  HCT 50.00 12/10/2022  MCV 94.5 12/10/2022  PLT 212 12/10/2022 Lab Results Component Value Date  NA 135 (L) 12/10/2022  K 4.3 12/10/2022  CL 101 12/10/2022  CO2 24 12/10/2022 Lab Results Component Value Date  CREATININE 0.84 12/10/2022 No results found for: INR, PROTIMEAssessment/Plan: 77 y.o. male with a history of bilateral hip replacements, nephrolithiasis, basal cell carcinoma. He  presented with low back pain. For 2 weeks he had back pain as well as occasional sciatica pain that shoots down to posterior aspect of the thigh and occasionally posterior aspect of the calf.  There is prominent circumferential epidural phlegmon and small septated abscesses, in the lower lumbar spine spanning from L4 through S1-S2 levels resulting in severe spinal canal stenosis and bilateral L5-S1 septic facet joint arthritis, L5-S1 discitis.Today patient has pain is getting better and he does not have any bowel or bladder complaints no loss of sensation no new weakness no cauda equina findingsHe is on antibiotic treatment.  -Activity as tolerated, should ambulate with AFOs-PVRs-Pain control-Bowel Regimen-PT/OT-Nonoperative management at the moment, infection treatment per primaryFuture Appointments Date Time Provider Department Center 06/22/2023  1:20 PM Scherrie November, MD PC [facility] OUTPATI Please use contact information below Contact information: [address] [place] and weeknights: Ortho Floor pager [phone]: Please check Qgenda for Ortho Floor ResidentSRC: [facility]University Orthopaedic TeamWeekdays (7:00 am - 4:30 pm Mon-Fri): Ortho [place] ([phone])Weeknights (4:30 pm - 7:00 am Sun-Thurs) Weekends (Friday after 4:30 PM, All-day Saturday, and Sunday until 4:30PM: Please page Ortho Floor [phone] Spine Service can be contacted  on [facility] Spine Serv Prov Dynamic Role at [phone]

## 2022-12-10 NOTE — Other
[name]  REQUEST  DOCUMENTATION-[facility] NOTE-Type of consult: [facility] Interventional Radiology -New Consult: [name] Carlos Caldwell / Location: [id number]-B / Brief Clinical Question: 71M here with spinal epidural abscess/phlegman; previously eval from IR for aspiration, but collection too small; requesting re-eval for bone bx to guide abx treatment/Callback Cell Phone: [phone]/ Use .consultnotetemplate or add .consultrecommendation to your consult notes. Remind your attending to use .consultattestation /Please confirm receipt of this message by texting back ?OK?-2 [name] Page sent to Interventional Radiology [facility] [phone] at 9:32 AM.-[name]8/17/20249:32 [facility] [phone]

## 2022-12-10 NOTE — Plan of Care
Plan of Care Overview/ Patient Status    0700-Assumed care. A&Ox4, VSS on RA. C/O pin, controlled per MAR. Skin inact. IV to R. Reg diet. Continent x2, Awaiting IR eval. Spouse at bedside. Call light within reach. Bed in lowest position. Call light within reach

## 2022-12-10 NOTE — Plan of Care
Plan of Care Overview/ Patient Status    Problem: Adult Inpatient Plan of CareGoal: Absence of Hospital-Acquired Illness or InjuryOutcome: Interventions implemented as appropriate Problem: Adult Inpatient Plan of CareGoal: Optimal Comfort and WellbeingOutcome: Interventions implemented as appropriate Problem: Skin Injury Risk IncreasedGoal: Skin Health and IntegrityOutcome: Interventions implemented as appropriate Problem: Fall Injury RiskGoal: Absence of Fall and Fall-Related InjuryOutcome: Interventions implemented as appropriate1900-0730NEURO: A/Ox4RESP: Room airCARDIAC: Denies CP, no telemetryGU/GI:Continent x2, using BP and urinal appriopriatelySKIN:L malleus callusDIET: Regular, PO medications whole with thin liquidsMOBILITY:Assist x2 to turn and repositionPAIN: Back pain, oxycodone given with moderate resultsIV:#22 RPt encouraged to T&R Q2. Bed alarm activated. Rounded on hourly. See flow sheets for details. Blood pressure (!) 177/99, pulse 73, temperature 97.4 ?F (36.3 ?C), temperature source Oral, resp. rate 20, height 6' 1 (1.854 m), weight 99.8 kg, SpO2 94 %.Aram Candela, RN 12/10/2022

## 2022-12-11 LAB — CBC WITH AUTO DIFFERENTIAL
BKR WAM ABSOLUTE IMMATURE GRANULOCYTES.: 0.03 x 1000/ÂµL (ref 0.00–0.30)
BKR WAM ABSOLUTE LYMPHOCYTE COUNT.: 1.05 x 1000/ÂµL (ref 0.60–3.70)
BKR WAM ABSOLUTE NRBC (2 DEC): 0 x 1000/ÂµL (ref 0.00–1.00)
BKR WAM ANC (ABSOLUTE NEUTROPHIL COUNT): 5.36 x 1000/ÂµL (ref 2.00–7.60)
BKR WAM BASOPHIL ABSOLUTE COUNT.: 0.04 x 1000/ÂµL (ref 0.00–1.00)
BKR WAM BASOPHILS: 0.5 % (ref 0.0–1.4)
BKR WAM EOSINOPHIL ABSOLUTE COUNT.: 0.17 x 1000/ÂµL (ref 0.00–1.00)
BKR WAM EOSINOPHILS: 2.3 % (ref 0.0–5.0)
BKR WAM HEMATOCRIT (2 DEC): 48.1 % (ref 38.50–50.00)
BKR WAM HEMOGLOBIN: 15.8 g/dL (ref 13.2–17.1)
BKR WAM IMMATURE GRANULOCYTES: 0.4 % (ref 0.0–1.0)
BKR WAM LYMPHOCYTES: 14.4 % — ABNORMAL LOW (ref 17.0–50.0)
BKR WAM MCH (PG): 31.2 pg (ref 27.0–33.0)
BKR WAM MCHC: 32.8 g/dL (ref 31.0–36.0)
BKR WAM MCV: 95.1 fL (ref 80.0–100.0)
BKR WAM MONOCYTE ABSOLUTE COUNT.: 0.64 x 1000/ÂµL (ref 0.00–1.00)
BKR WAM MONOCYTES: 8.8 % (ref 4.0–12.0)
BKR WAM MPV: 9.6 fL (ref 8.0–12.0)
BKR WAM NEUTROPHILS: 73.6 % — ABNORMAL HIGH (ref 39.0–72.0)
BKR WAM NUCLEATED RED BLOOD CELLS: 0 % (ref 0.0–1.0)
BKR WAM PLATELETS: 192 x1000/ÂµL (ref 150–420)
BKR WAM RDW-CV: 13.2 % (ref 11.0–15.0)
BKR WAM RED BLOOD CELL COUNT.: 5.06 M/ÂµL (ref 4.00–6.00)
BKR WAM WHITE BLOOD CELL COUNT: 7.3 x1000/ÂµL (ref 4.0–11.0)

## 2022-12-11 LAB — BASIC METABOLIC PANEL
BKR ANION GAP: 11 (ref 7–17)
BKR BLOOD UREA NITROGEN: 24 mg/dL — ABNORMAL HIGH (ref 8–23)
BKR BUN / CREAT RATIO: 28.6 — ABNORMAL HIGH (ref 8.0–23.0)
BKR CALCIUM: 9 mg/dL (ref 8.8–10.2)
BKR CHLORIDE: 102 mmol/L (ref 98–107)
BKR CO2: 24 mmol/L (ref 20–30)
BKR CREATININE: 0.84 mg/dL (ref 0.40–1.30)
BKR EGFR, CREATININE (CKD-EPI 2021): >60 mL/min/{1.73_m2} (ref >=60)
BKR GLUCOSE: 87 mg/dL (ref 70–100)
BKR POTASSIUM: 4.4 mmol/L (ref 3.3–5.3)
BKR SODIUM: 137 mmol/L (ref 136–144)

## 2022-12-11 MED ORDER — ACETAMINOPHEN 500 MG TABLET
500 mg | Freq: Three times a day (TID) | ORAL | Status: DC
Start: 2022-12-11 — End: 2022-12-13
  Administered 2022-12-12 – 2022-12-13 (×3): 500 mg via ORAL

## 2022-12-11 NOTE — Progress Notes
Orthopaedic Spine Progress NoteAdmit date: 12/07/2022, Hospital Day: 5 Dx: L4-S2 posterial spinal epidural collectionbilateral L5-S1 septic facet joint arthritis, L5-S1 discitisDOI: 12/07/2022, Subjective: low back painPhysical Exam:His vital signs today were Blood pressure (!) 167/89, pulse 71, temperature 98.4 ?F (36.9 ?C), temperature source Oral, resp. rate 18, height 6' 1 (1.854 m), weight 102.1 kg, SpO2 97 %.. Gen: Awake, alert, NAD Lung: unlabored breathing on NC/RASkin intactminimal tenderness to palpation over spinous processesNo appreciable step offsNo scoliosis grosslyMotor:   R L C5 (Shoulder abd) 5 /5 5 /5 C5 (Elbow Flex) 5 /5 5/5 C6 (wrist ext) 5 /5 5 /5 C7 (Elbow ext) 5 /5 5 /5 C8 (Finger flex) 5 /5 5 /5 T1 (Hand intrinsics) 5 /5 5 /5   R L L2 (Hip flexors) 5 /5 5 /5 L3 (Knee ext) 5 /5 5 /5 L4(Tibialis ant) 5 /5 5 /5 L5 (EHL) 5 /5 5 /5 S1 (Gastroc) 5 /5 5 /5  Sensation:  R L C5 (Lateral arm) + + C6 (Thumb) + + C7 (Long finger) + + C8 (Small finger) + + T1 (Medial elbow) + +    R L L2 (Medial thigh) + + L3 (Medial knee) + + L4 (Medial ankle) + + L5 (1st web space) + + S1 (Lateral heel) + +  Reflexes:  R L C5 (Biceps) ++ ++ C6 (Brachioradialis) ++ ++ C7 (Triceps) ++ ++ L4 (Patellar) ++ ++ S1 (Achilles) ++ ++  TENSION SIGNS Negative straight-leg raiseNegative crossed straight-leg raiseUMN SIGNSNegative Hoffman's No evidence of clonus Rectal tone: intactPerineal sensation: intactImaging: Abnormal signal and enhancement compatible with bilateral L5-S1 septic facet joint arthritis, with mild signal abnormality and enhancement about the L5-S1 endplates also suggestive of discitis. There is prominent circumferential epidural phlegmon and small septated abscesses, in the lower lumbar spine spanning from L4 through S1-S2 levels resulting in severe spinal canal stenosis Labs:Lab Results Component Value Date  WBC 7.3 12/11/2022  HGB 15.8 12/11/2022  HCT 48.10 12/11/2022  MCV 95.1 12/11/2022  PLT 192 12/11/2022 Lab Results Component Value Date  NA 137 12/11/2022  K 4.4 12/11/2022  CL 102 12/11/2022  CO2 24 12/11/2022 Lab Results Component Value Date  CREATININE 0.84 12/11/2022 No results found for: INR, PROTIMEAssessment/Plan: 77 y.o. male with a history of bilateral hip replacements, nephrolithiasis, basal cell carcinoma. He  presented with low back pain. For 2 weeks he had back pain as well as occasional sciatica pain that shoots down to posterior aspect of the thigh and occasionally posterior aspect of the calf.  There is prominent circumferential epidural phlegmon and small septated abscesses, in the lower lumbar spine spanning from L4 through S1-S2 levels resulting in severe spinal canal stenosis and bilateral L5-S1 septic facet joint arthritis, L5-S1 discitis.Today has no pain and is getting better and he does not have any bowel or bladder complaints no loss of sensation no new weakness no cauda equina findingsHe is on antibiotic treatment.  -Activity as tolerated, should ambulate with AFOs-PVRs-Pain control-PT/OT-Nonoperative management at the moment, infection treatment per primaryFuture Appointments Date Time Provider Department Center 06/22/2023  1:20 PM Scherrie November, MD PC [facility] OUTPATI Please use contact information below Contact information: [address] [place] and weeknights: Ortho Floor pager [phone]: Please check Qgenda for Ortho Floor ResidentSRC: [facility]University Orthopaedic TeamWeekdays (7:00 am - 4:30 pm Mon-Fri): Ortho PA ([phone])Weeknights (4:30 pm - 7:00 am Sun-Thurs) Weekends (Friday after 4:30 PM, All-day Saturday, and Sunday until 4:30PM: Please page Ortho Floor [phone] Spine Service can be contacted on  [facility] Spine Serv Prov Dynamic Role at [phone]

## 2022-12-11 NOTE — Progress Notes
INFECTIOUS DISEASES INPATIENT PROGRESS NOTE Patient's name:Kosei [id number] Referring provider: Attending Provider: Gwen Pounds, MD [phone] Admitted: 8/14/2024Date of service: 8/16/2024Interval History: Mr. Toran feels his back pain is much better today. He is able to lie flat, whereas that was not possible on admission.. Still with pain when he ambulates. He was evaluated by IR for epidural abscess, but collection was thought to be too small/ difficult to access. He is awaiting re-eval for possible bone biopsy.Past Medical History, Family History, Social History, Immunization, Allergies: have all been reviewed and remains unchanged Antimicrobial MedicationsCurrent Facility-Administered Medications Medication Dose Route Frequency  cefTRIAXone  2 g Intravenous Q24H  metroNIDAZOLE  500 mg Oral Q8H  vancomycin  1.25 g Intravenous Q12H Physical ExaminationTemp:  [97.4 ?F (36.3 ?C)-97.6 ?F (36.4 ?C)] 97.4 ?F (36.3 ?C)Pulse:  [73-77] 73Resp:  [19-20] 20BP: (150-179)/(76-99) 177/99SpO2:  [94 %-96 %] 94 %Device (Oxygen Therapy): room airHEENT: NCATGeneral: lying flat on back in no acute distressCV: RRR, normal S1 S2 II/VI SMPulm: CTA, no wheezing or rales, normal effortGI: soft, ND, NT, no rebound/guardingExtr: No edemaNeuro: alert, conversant, + straight leg raise.LABORATORY TESTSRecent Labs   08/14/241145 08/16/241014 WBC 20.9* 8.5 RBC 5.23 5.18 HGB 16.3 15.8 HCT 48.80 49.00 MCV 93.3 94.6 PLT 195 189 NEUTROPHILS 91.2* 79.8* LYMPHOCYTES 1.5* 9.8* MONOCYTES 5.4 7.9 EOSINOPHILS 0.0 1.3 Recent Labs Lab 08/14/241145 08/16/241014 NA 134* 137 K 4.2 4.2 CL 97* 101 CO2 28 26 BUN 23 24* CREATININE 0.82 0.80 CALCIUM 9.3 8.9 GLU 116* 120*  Estimated Creatinine Clearance: 89 mL/min (by C-G formula based on SCr of 0.8 mg/dL).Microbiology/VirologyRecent Labs Lab 08/15/240604 LABBLOO No Growth to Date 12/08/22	MRSA screen	 Not detectedRadiologyMRI Cervical Spine with and without IV ContrastResult Date: 12/09/2022  No evidence of discitis/osteomyelitis in the cervical or thoracic spine. Multilevel degenerative changes in the cervical spine, without significant spinal canal stenosis. Multilevel degenerative changes in the thoracic spine resulting in severe spinal canal stenosis at T10-11 level, resulting in mild cord compression. No definite cord signal abnormality, within limitations of motion artifact. Moderate spinal canal stenosis at T9-10 and T11-12 levels. Nonspecific mild dural/epidural enhancement in the lower thoracic spine at T9-T11 levels, probably from epidural venous congestion, could be reactive to infection/epidural abscess in the lower lumbar spine. No epidural collection or abscess is seen. [facility] Radiology Notify System Classification: Urgent result. Reported and signed by: Dion Saucier, MD  [facility] Radiology and Biomedical Imaging MRI Thoracic Spine with and without IV ContrastResult Date: 12/09/2022  No evidence of discitis/osteomyelitis in the cervical or thoracic spine. Multilevel degenerative changes in the cervical spine, without significant spinal canal stenosis. Multilevel degenerative changes in the thoracic spine resulting in severe spinal canal stenosis at T10-11 level, resulting in mild cord compression. No definite cord signal abnormality, within limitations of motion artifact. Moderate spinal canal stenosis at T9-10 and T11-12 levels. Nonspecific mild dural/epidural enhancement in the lower thoracic spine at T9-T11 levels, probably from epidural venous congestion, could be reactive to infection/epidural abscess in the lower lumbar spine. No epidural collection or abscess is seen. [facility] Radiology Notify System Classification: Urgent result. Reported and signed by: Dion Saucier, MD  [facility] Radiology and Biomedical Imaging MRI Lumbar Spine with and without IV ContrastResult Date: 8/15/2024Abnormal signal and enhancement compatible with bilateral L5-S1 septic facet joint arthritis, with mild signal abnormality and enhancement about the L5-S1 endplates also suggestive of discitis. There is prominent circumferential epidural phlegmon and small septated abscesses, in the lower lumbar spine spanning from L4 through S1-S2 levels resulting in severe spinal canal stenosis. Recommend urgent  spine surgery consultation. Above findings were communicated to and acknowledged by Posey Pronto at 12/08/2022 1:56 AM. [facility] Radiology Notify System Classification: Clinical team aware. Reported and signed by: Dion Saucier, MD  [facility] Radiology and Biomedical Imaging CXRResult Date: 8/14/2024No acute findings. [facility] Radiology Notify System Classification:  Routine. Reported and signed by: Bradly Bienenstock, MD  [facility] Radiology and Biomedical Imaging [place] Lumbar Spine wo IV ContrastResult Date: 12/07/2022 Multilevel degenerative disease of the lumbar spine with severe left-sided neural foraminal stenosis at L5-S1. No acute fracture or subluxation. [facility] Radiology Notify System Classification: Routine. Report initiated by:  Catha Brow, MD Reported and signed by: Bradly Bienenstock, MD  [facility] Radiology and Biomedical Imaging   ASSESSMENT/ RECOMMENDATIONS:77 y.o. with h/o bilateral hip replacements, kidney stone, hyperlipidemia, remote history of basal cell carcinoma history peripheral neuropathy, who presented on 08/14 with lower back pain for 2 weeks. Found to have f L5-S1 septic facet joint arthritis, L5-S1 diskitis, L4 to S2 prominent circumferential epidural phlegmon and small septated abscesses. Evaluated by IR for  possible aspiration fo phlegmon or abscess areas; however, on exam by IR collections too small to tap. TTE without evidence of vegetations.Please re-engage IR to ask about possibility of vertebral body biopsy to send for culture. Continue vancomycin ceftriaxone metronidazole.   Electronically Signed:Tevin Shillingford S Eesha Schmaltz, MDSection of Infectious DiseasesYale School of Medicine8/17/2024  12:59 AM

## 2022-12-11 NOTE — Plan of Care
Problem: Adult Inpatient Plan of CareGoal: Absence of Hospital-Acquired Illness or InjuryOutcome: Interventions implemented as appropriate Problem: Adult Inpatient Plan of CareGoal: Optimal Comfort and WellbeingOutcome: Interventions implemented as appropriate Problem: Fall Injury RiskGoal: Absence of Fall and Fall-Related InjuryOutcome: Interventions implemented as appropriate Problem: Wound Healing ProgressionGoal: Optimal Wound HealingOutcome: Interventions implemented as appropriate Plan of Care Overview/ Patient Status    NEURO: A/Ox4RESP: Room airCARDIAC: Denies CP, no telemetryGU/GI:Continent x2, using BP and urinal appriopriatelySKIN:L malleus callusDIET: Regular, PO medications whole with thin liquidsMOBILITY:Assist x2 to turn and repositionPAIN: Back pain, oxycodone given with good resultsIV:#22 RPt encouraged to T&R Q2. Bed alarm activated. Rounded on hourly. See flow sheets for details. Blood pressure (!) 167/89, pulse 71, temperature 98.4 ?F (36.9 ?C), temperature source Oral, resp. rate 18, height 6' 1 (1.854 m), weight 102.1 kg, SpO2 97 %.Aram Candela, RN 12/11/2022

## 2022-12-11 NOTE — Plan of Care
Plan of Care Overview/ Patient Status    0700-1900:Pt is a male. Pt with moderate pain at this time. Oxy, flexeril, & tylenol given with positive effect. Neuro: A/o x4CV: Denies chest pain, hypertensive this shift, provider made aware, no new orders at this timeResp: Room airGI Last Bowel Movement: 08/16/24GU  Continent, urinal at bedsideSkin:  See skin flowsheet. T+P q2h, wedge in use, yes . Skin check performed. Does not have foam dressing to coccyx in place. Pt remains on pressure reduction mattress and heels elevated off bed. Access Periph IV 12/09/22 0353  over-the-needle catheter system 22 gauge 1 in length Radiology Tech (Active) SiteCare/Dressing Status/Securement dressing dry and intact 12/11/22 0905 Date Dressing Applied/Changed 12/10/22 12/10/22 2100 Next Date Dressing Change 12/17/2022 12/10/22 2100 Lumen 1 Patency/Care Patent;flushed w/o difficulty 12/11/22 2952 Site Signs asymptomatic without redness, warmth, swelling, pain, palpable cord, streak formation, drainage, dressing intact 12/11/22 0905 Phlebitis 0-->no symptoms 12/11/22 0905 Infiltration/Extravasation Assessment 0-->no symptoms 12/11/22 0905 Patient Education Instructed to keep IV site dry;Instructed to call nurse if site is painful,red,swollen, burning;Instructed to call nurse if fluid leaking from site 12/11/22 0905 Daily Review of Necessity ** completed 12/11/22 0905 Mobility: AMPAC Mobility Score: 12Bed alarm in place, all safety measures implemented.Significant events from this shift: Pt encouraged to get up into chair, pt refused stated, not today I am too tired , education provided

## 2022-12-12 ENCOUNTER — Inpatient Hospital Stay: Admit: 2022-12-12 | Payer: PRIVATE HEALTH INSURANCE

## 2022-12-12 LAB — CBC WITH AUTO DIFFERENTIAL
BKR WAM ABSOLUTE IMMATURE GRANULOCYTES.: 0.05 x 1000/ÂµL (ref 0.00–0.30)
BKR WAM ABSOLUTE LYMPHOCYTE COUNT.: 0.79 x 1000/ÂµL (ref 0.60–3.70)
BKR WAM ABSOLUTE NRBC (2 DEC): 0 x 1000/ÂµL (ref 0.00–1.00)
BKR WAM ANC (ABSOLUTE NEUTROPHIL COUNT): 5.24 x 1000/ÂµL (ref 2.00–7.60)
BKR WAM BASOPHIL ABSOLUTE COUNT.: 0.04 x 1000/ÂµL (ref 0.00–1.00)
BKR WAM BASOPHILS: 0.6 % (ref 0.0–1.4)
BKR WAM EOSINOPHIL ABSOLUTE COUNT.: 0.18 x 1000/ÂµL (ref 0.00–1.00)
BKR WAM EOSINOPHILS: 2.6 % (ref 0.0–5.0)
BKR WAM HEMATOCRIT (2 DEC): 49.9 % (ref 38.50–50.00)
BKR WAM HEMOGLOBIN: 16.1 g/dL (ref 13.2–17.1)
BKR WAM IMMATURE GRANULOCYTES: 0.7 % (ref 0.0–1.0)
BKR WAM LYMPHOCYTES: 11.4 % — ABNORMAL LOW (ref 17.0–50.0)
BKR WAM MCH (PG): 30.6 pg (ref 27.0–33.0)
BKR WAM MCHC: 32.3 g/dL (ref 31.0–36.0)
BKR WAM MCV: 94.7 fL (ref 80.0–100.0)
BKR WAM MONOCYTE ABSOLUTE COUNT.: 0.62 x 1000/ÂµL (ref 0.00–1.00)
BKR WAM MONOCYTES: 9 % (ref 4.0–12.0)
BKR WAM MPV: 9.4 fL (ref 8.0–12.0)
BKR WAM NEUTROPHILS: 75.7 % — ABNORMAL HIGH (ref 39.0–72.0)
BKR WAM NUCLEATED RED BLOOD CELLS: 0 % (ref 0.0–1.0)
BKR WAM PLATELETS: 201 x1000/ÂµL (ref 150–420)
BKR WAM RDW-CV: 13.1 % (ref 11.0–15.0)
BKR WAM RED BLOOD CELL COUNT.: 5.27 M/ÂµL (ref 4.00–6.00)
BKR WAM WHITE BLOOD CELL COUNT: 6.9 x1000/ÂµL (ref 4.0–11.0)

## 2022-12-12 LAB — BASIC METABOLIC PANEL
BKR ANION GAP: 11 (ref 7–17)
BKR BLOOD UREA NITROGEN: 22 mg/dL (ref 8–23)
BKR BUN / CREAT RATIO: 27.2 — ABNORMAL HIGH (ref 8.0–23.0)
BKR CALCIUM: 9 mg/dL (ref 8.8–10.2)
BKR CHLORIDE: 99 mmol/L (ref 98–107)
BKR CO2: 25 mmol/L (ref 20–30)
BKR CREATININE: 0.81 mg/dL (ref 0.40–1.30)
BKR EGFR, CREATININE (CKD-EPI 2021): >60 mL/min/{1.73_m2} (ref >=60)
BKR GLUCOSE: 99 mg/dL (ref 70–100)
BKR POTASSIUM: 4.4 mmol/L (ref 3.3–5.3)
BKR SODIUM: 135 mmol/L — ABNORMAL LOW (ref 136–144)

## 2022-12-12 MED ORDER — IBUPROFEN 400 MG TABLET
400 mg | Freq: Four times a day (QID) | ORAL | Status: DC | PRN
Start: 2022-12-12 — End: 2022-12-13

## 2022-12-12 MED ORDER — SODIUM CHLORIDE 0.9 % (FLUSH) INJECTION SYRINGE
0.9 % | Status: DC
Start: 2022-12-12 — End: 2022-12-13
  Administered 2022-12-13: 12:00:00 0.9 mL

## 2022-12-12 MED ORDER — OXYCODONE IMMEDIATE RELEASE 5 MG TABLET
5 mg | Freq: Four times a day (QID) | ORAL | Status: DC | PRN
Start: 2022-12-12 — End: 2022-12-13

## 2022-12-12 MED ORDER — OXYCODONE IMMEDIATE RELEASE 5 MG TABLET
5 | Freq: Four times a day (QID) | ORAL | Status: DC | PRN
Start: 2022-12-12 — End: 2022-12-12

## 2022-12-12 MED ORDER — POVIDONE-IODINE 10 % NASAL SWAB
10 % | Freq: Two times a day (BID) | NASAL | Status: DC
Start: 2022-12-12 — End: 2022-12-13
  Administered 2022-12-13 (×2): 10 % via NASAL

## 2022-12-12 MED ORDER — CHLORHEXIDINE GLUCONATE 4 % TOPICAL LIQUID
4 % | Freq: Every day | TOPICAL | Status: DC
Start: 2022-12-12 — End: 2022-12-13

## 2022-12-12 MED ORDER — SODIUM CHLORIDE 0.9 % (FLUSH) INJECTION SYRINGE
0.9 % | Status: DC | PRN
Start: 2022-12-12 — End: 2022-12-13

## 2022-12-12 MED ORDER — LIDOCAINE (PF) 10 MG/ML (1 %) INJECTION SOLUTION
10 | Freq: Once | INTRADERMAL | Status: CP
Start: 2022-12-12 — End: ?
  Administered 2022-12-12: 23:00:00 10 mL via INTRADERMAL

## 2022-12-12 NOTE — Progress Notes
[facility]	 [facility]	Medicine Progress NoteAttending Physician: Gwen Pounds, MDLength of Stay: 4 daysSubjective/Interim: Overnight:- NAEONToday:- No acute concerns; pain control is good with low-dose oxyObjective: Vitals:Temp:  [97.6 ?F (36.4 ?C)-98.6 ?F (37 ?C)] 98.5 ?F (36.9 ?C)Pulse:  [75-87] 80Resp:  [16-19] 19BP: (155-189)/(83-100) 155/90SpO2:  [93 %-95 %] 95 %Device (Oxygen Therapy): room air  I/O's:Intake/Output Summary (Last 24 hours) at 12/12/2022 0756Last data filed at 12/12/2022 0500Gross per 24 hour Intake -- Output 875 ml Net -875 ml  Physical Exam:Gen: NADCV: RRR, no murmursPulm: CTAB, normal WOB on RAGI: Soft, NTNDExt: No edemaNeuro/psych: answering questions appropriatelyLABS:ChemistryRecent Labs Lab 08/16/241014 08/17/240516 08/18/240521 08/19/240652 NA 137 135* 137 135* K 4.2 4.3 4.4 4.4 CL 101 101 102 99 CO2 26 24 24 25  BUN 24* 21 24* 22 CREATININE 0.80 0.84 0.84 0.81 ANIONGAP 10 10 11 11  CALCIUM 8.9 9.0 9.0 9.0  CBCRecent Labs Lab 08/16/241014 08/17/240516 08/18/240521 08/19/240652 WBC 8.5 9.5 7.3 6.9 NEUTROPHILS 79.8* 79.7* 73.6* 75.7* HGB 15.8 16.7 15.8 16.1 HCT 49.00 50.00 48.10 49.90 PLT 189 212 192 201 MCV 94.6 94.5 95.1 94.7  LFTsNo results for input(s): ALT, AST, ALKPHOS, BILITOT, BILIDIR, PROT, ALBUMIN in the last 168 hours.No results for input(s): LIPASE in the last 168 hours. CoagsNo results for input(s): PTT, LABPROT, INR, FIBRINOGEN in the last 168 hours. 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 No evidence of cord compression/cauda equina. Seen by IR 8/17, not candidate for bone biopsy. - Continue CTX, Flagyl, Vancomycin	- OPAT is in	- will engage with care coordination to set up outpatient abx- PICC consult today- PT - Appreciate ID and Orthopaedics# Chronic- Cont ASA, Crestor, ZyrtecDiet: Diet Regular Last stool: Last Bowel Movement: 08/16/24Access/Lines: PIVDVT PPx: LovenoxDispo:  Pending PICC placement, PT recommendationsCode status: FULL Thad Ranger, MDInternal Medicine, PGY-38/19/2024

## 2022-12-12 NOTE — Other
Order received and documents reviewed.PICC line placement delayed due to need for OPAT in place as well as services set up by case management.PICC team will follow chart.Responsible MD notified, Dr. Bosie Clos

## 2022-12-12 NOTE — Treatment Summary
ID TREATMENT PLAN:OPAT: 12/12/2022 OPAT FLOWSHEETS  Did you create your episode? Yes  Provider's name for patient's next ID appointment TBD  Infection Type(s) CNS Infection;Osteomyelitis  Clinic Address: [facility] [address] [place], [place] [address]  Next ID apointment date 01/09/2023  Name of IV Antibiotic:  Vancomycin;Ceftriaxone  Vancomycin Duration: 6 weeks  Vancomycin Anticipated Stop Date: 01/18/2023  Ceftriaxone Duration: 6 weeks  Ceftriaxone  Anticipated Stop Date: 01/18/2023  Name of Oral Antibiotics Metronidazole  Metronidazole Duration: 6 weeks  Metronidazole Anticipated Stop Date: 01/18/2023  Labs to be drawn weekly (preferably Mondays) and faxed to: [facility]: [phone]  If there is any change in patient's clinical status,  CRITCAL/ abnormal labs OR order verification, please CALL: During Office hours: [facility]: [phone];After Office hours and weekends: Please call the hospital operator [phone] and ask for the ON CALL Infectious Diseases Fellow  Intructions for dressing changes and biopatch Change dressing of PICC or central intravascular line site weekly;Apply bio-patch to PICC or other central line weekly at time of dressing  Laboratory Monitoring CBC With differential and platelet;Basic Metabolic Panel (Glucose, BUN, Creatinine, electrolytes, Calcium);Liver Function tests (AST, ALT, Total Bili, Direct Bili, Alk phos, );Serum Vancomycin trough level (drawn 30-60 min BEFORE scheduled dose);CRP (C Reactive Protein)  VANCO TROUGH GOAL IS: 10 - 20  Special instruction for Vancomycin or Gentamicin that requires drug level monitoring: For patients on vancomycin, please confirm the dose and duration of vancomycin prior to discharge with the OPAT pharmacist @ [phone] CALL if Vancomycin level is less than 10 OR greater than or equal to 20 microgram/mL  Culture results in need of monitoring? No  Does imaging need to be scheduled prior to antibiotic completion? No  Lucendia Herrlich, MDInfectious Disease [employer]

## 2022-12-12 NOTE — Plan of Care
Problem: Adult Inpatient Plan of CareGoal: Plan of Care ReviewOutcome: Interventions implemented as appropriateFlowsheets (Taken 12/12/2022 0835)Plan of Care Reviewed With: patient Problem: InfectionGoal: Absence of Infection Signs and SymptomsOutcome: Interventions implemented as appropriateIntervention: Prevent or Manage InfectionFlowsheets (Taken 12/12/2022 0835)Fever Reduction/Comfort Measures: medication administeredIsolation Precautions: protective environment maintained Plan of Care Overview/ Patient Status    Pt is a male. Pt with moderate pain at this time. Patient provided oxy & Tylenol with good result.Neuro: A/Ox4; PERRLA; difficulty moving LE's - weakness noted in all ext - numbness to LE's - patient uses braces when oobCV: No tele - apical HR regular - cap refill <3sec; pulses 2/2; pallor normalResp: On RA no SOB - lungs clear yet diminished - no coughGI Last Bowel Movement: 08/16/24GU  Uses urinal at bedside - good UOPSkin: Pt remains on pressure reduction mattress and heels elevated off bed. Please refer to Epic for detailed head to toe assessment documentation.Access Periph IV 12/09/22 0353  over-the-needle catheter system 22 gauge 1 in length Radiology Tech (Active) SiteCare/Dressing Status/Securement dressing dry and intact 12/11/22 2130 Date Dressing Applied/Changed 12/10/22 12/11/22 2130 Next Date Dressing Change 12/17/2022 12/11/22 2130 Lumen 1 Patency/Care Patent;flushed w/o difficulty 12/11/22 2130 Site Signs asymptomatic without redness, warmth, swelling, pain, palpable cord, streak formation, drainage, dressing intact 12/11/22 2130 Phlebitis 0-->no symptoms 12/11/22 2130 Infiltration/Extravasation Assessment 0-->no symptoms 12/11/22 2130 Patient Education Instructed to call nurse if site is painful,red,swollen, burning 12/11/22 2130 Daily Review of Necessity ** completed 12/11/22 2130 Mobility: AMPAC Mobility Score: 12Bed alarm in place, all safety measures implemented.Isolation Precautions: Isolation Precautions: protective environment maintainedG. Ivan Croft, RN

## 2022-12-12 NOTE — Progress Notes
Orthopaedic Spine Progress NoteAdmit date: 12/07/2022, Hospital Day: 6 Dx: L4-S2 posterial spinal epidural collectionbilateral L5-S1 septic facet joint arthritis, L5-S1 discitisDOI: 12/07/2022, Subjective:Patient complaining of low back pain but states that it is improving.  Denies any new extremity symptoms.  No weakness or paresthesias.Physical Exam:His vital signs today were Blood pressure (!) 155/90, pulse 80, temperature 98.5 ?F (36.9 ?C), temperature source Oral, resp. rate 19, height 6' 1 (1.854 m), weight 102.1 kg, SpO2 95 %.. Gen: Awake, alert, NAD Lung: unlabored breathing on NC/RASkin intactNo tenderness in cervical or thoracic spine.  Some tenderness in the lower lumbar spine.No appreciable step offsNo scoliosis grosslyMotor:   R L C5 (Shoulder abd) 5 /5 5 /5 C5 (Elbow Flex) 5 /5 5/5 C6 (wrist ext) 5 /5 5 /5 C7 (Elbow ext) 5 /5 5 /5 C8 (Finger flex) 5 /5 5 /5 T1 (Hand intrinsics) 5 /5 5 /5   R L L2 (Hip flexors) 5 /5 5 /5 L3 (Knee ext) 5 /5 5 /5 L4(Tibialis ant) 5 /5 5 /5 L5 (EHL) 5 /5 5 /5 S1 (Gastroc) 5 /5 5 /5  Sensation:  R L C5 (Lateral arm) + + C6 (Thumb) + + C7 (Long finger) + + C8 (Small finger) + + T1 (Medial elbow) + +    R L L2 (Medial thigh) + + L3 (Medial knee) + + L4 (Medial ankle) + + L5 (1st web space) + + S1 (Lateral heel) + +  Reflexes:No hyperreflexia UMN SIGNSNegative Hoffman's No evidence of clonus Imaging: 8/16 total spine without any evidence of diskitis/osteomyelitis in the cervical or thoracic spine.  Nonspecific mild epidural enhancement in the lower thoracic spine at T9-T11.12/08/2022 MRI lumbar spine demonstrates signal enhancement of the bilateral L5-S1 facets as well as L5-S1 endplates.  There is some epidural phlegmon from L4 through S1-2 resulting in severe spinal canal stenosis.Labs:Lab Results Component Value Date  WBC 6.9 12/12/2022 HGB 16.1 12/12/2022  HCT 49.90 12/12/2022  MCV 94.7 12/12/2022  PLT 201 12/12/2022 Lab Results Component Value Date  NA 135 (L) 12/12/2022  K 4.4 12/12/2022  CL 99 12/12/2022  CO2 25 12/12/2022 Lab Results Component Value Date  CREATININE 0.81 12/12/2022 No results found for: INR, PROTIMEAssessment/Plan: 77 y.o. male with a history of bilateral hip replacements, nephrolithiasis, basal cell carcinoma who presented with low back pain in his noted to have circumferential epidural phlegmon and small septated abscesses, in the lower lumbar spine spanning from L4 through S1-S2 levels resulting in severe spinal canal stenosis and bilateral L5-S1 septic facet joint arthritis, L5-S1 discitis.  Patient also with mild epidural enhancement from T9-T11.Neurologic exam is intact.  No cauda equina findings.-Activity as tolerated, recommend LSO brace when out of bed (ordered for you)-PVRs-Pain control-PT/OT-Nonoperative management at the moment, infection treatment per primary-orthopedics spine we will sign off at this time-patient can follow up with Infectious Disease team outpatient.  No formal orthopedic spine follow up indicated.-please call with any questions or concernsFuture Appointments Date Time Provider Department Center 06/22/2023  1:20 PM Scherrie November, MD [facility] OUTPATI Please use contact information below Contact information: [address] [place] [employer] and weeknights: Ortho Floor pager [phone]: Please check Qgenda for Ortho Floor ResidentSRC: [facility] Orthopaedic TeamWeekdays (7:00 am - 4:30 pm Mon-Fri): Ortho PA [phone])Weeknights (4:30 pm - 7:00 am Sun-Thurs) Weekends (Friday after 4:30 PM, All-day Saturday, and Sunday until 4:30PM: Please page Ortho Floor [id number] Spine Service can be contacted on [facility] Spine Serv Prov Dynamic Role at [phone]

## 2022-12-12 NOTE — Plan of Care
Inpatient Physical Therapy Progress Note IP Adult PT Eval/Treat - 12/12/22 1027    Date of Visit / Treatment  Date of Visit / Treatment 12/12/22   Start Time 10:00   End Time 10:27   Total Treatment Time 27    General Information  Subjective Agreeable to physical therapy.   General Observations Patient presented in supine with head of bed elevated on 9-7 with a peripheral intravenous and required encouragement to participate with physical therapy. Patient's wife arrived during therapy prior to transfer to chair.   Precautions/Limitations Fall Precautions;Bed alarm    Vital Signs and Orthostatic Vital Signs  Vital Signs Free text vital signs stable on room air    Pain/Comfort  Pain Comment (Pre/Post Treatment Pain) Patient with no complaints of pain at rest, however excruciating pain with mobility, 10/10 back pain with radicular symptoms to bilateral lower extremities. Patient was premedicated. RN aware. Patient was positioned to comfort at end of session as able in upright sitting in reclining chair. MD made aware of pain limiting functional mobility.    Patient Coping  Observed Emotional State accepting   Verbalized Emotional State acceptance    Cognition  Orientation Level Oriented X4   Level of Consciousness alert   Following Commands Follows one step commands without difficulty   Personal Safety / Judgment Fall risk    Range of Motion  Range of Motion Examination bilateral upper extremity ROM was WFL;bilateral lower extremity ROM was [facility]   Range of Motion Comments history of Charcot Marie Tooth in bilateral feet with limited dorsiflexion bilaterally and left foot with increased inversion.    Manual Muscle Testing  Manual Muscle Testing Comments Strength limited by pain.    Musculoskeletal  LUE Muscle Strength Grading 3-->active movement against gravity   RUE Muscle Strength Grading 3-->active movement against gravity   LLE Muscle Strength Grading 3-->active movement against gravity   RLE Muscle Strength Grading 3-->active movement against gravity    Skin Assessment  Skin Assessment Comments See nursing assessment for details.    Balance  Sitting Balance: Static  FAIR-      Contact Guard to maintain static position with no Assistive Device   Sitting Balance: Dynamic  POOR+   Moves through 1/2 range with minimal assist to right self   Standing Balance: Static POOR      Moderate assist to maintain static position with no Assistive Device   Standing Balance: Dynamic  POOR     Moves through 1/4 to 1/2 ROM range with moderate assist to right self   Balance Assist Device Hand held assist   Balance Skills Training Comment Assistance x 1. Attempted utilizing rolling walker and patient could not tolerate reaching up.    Bed Mobility  Rolling/Turning Right - Independence/Assistance Level Contact guard   Rolling/Turning Left - Independence/Assistance Level Contact guard   Rolling/Turning Assist Device Bed rails;Head of bed elevated   Supine-to-Sit Independence/Assistance Level Contact guard   Supine-to-Sit Assist Device Bed rails;Head of bed elevated   Sit-to-Supine Independence/Assistance Level Contact guard   Sit-to-Supine Assist Device Bed rails;Head of bed elevated   Bed Mobility, Impairments strength decreased;impaired balance;decreased endurance/activity tolerance;pain   Bed mobility was limited by: pain   Bed Mobility Comments Patient required increased time to attain upright sitting due to pain.    Soil scientist Independence/Assistance Level Moderate assist   Sit-to-Stand Transfer Assist Device Hand held assist   Stand-to-Sit Transfer Independence/Assistance Level Moderate assist   Stand-to-Sit Transfer Assist Device  Hand held assist   Sit-Stand Transfer Comments Patient unable to tolerate full upright stance due to pain in back with radicular symptoms.   Stand-Sit Transfer Comments Poor eccentric lowering.    Bed-Chair Designer, industrial/product Independence/Assistance Level Moderate assist   Bed-to-Chair Transfer Assist Device Hand held assist   Chair-to-Bed Transfer Independence/Assistance Level Moderate assist   Chair-to-Bed Transfer Assist Device Hand held assist   Transfer Safety Analysis Concerns decreased weight-shifting ability   Transfer Safety Analysis Impairment impaired balance;decreased strength;decreased endurance/activity tolerance;pain   Bed-Chair Transfer Comments Patient performed squat pivot transfer from bed to chair. Patient requested to transfer to chair prior to donning bilateral AFOs. Patient with decreased tolerance for chair positioning due to pain and pillows were added posteriorly for back support. Patient declined to perform donning of AFOs.    Gait Training  Independence/Assistance Level  Unable to assess    Handoff Documentation  Handoff Patient in chair;Patient instructed to call nursing for mobility;Discussed with nursing    PT- AM-PAC - Basic Mobility Screen- How much help from another person do you currently need.....  Turning from your back to your side while in a a flat bed without using rails? 3 - A Little - Requires a little help (supervision, minimal assistance). Can use assistive devices.   Moving from lying on your back to sitting on the side of a flat bed without using bed rails? 3 - A Little - Requires a little help (supervision, minimal assistance). Can use assistive devices.   Moving to and from a bed to a chair (including a wheelchair)? 2 - A Lot - Requires a lot of help (maximum to moderate assistance). Can use assistive devices.   Standing up from a chair using your arms(e.g., wheelchair or bedside chair)? 2 - A Lot - Requires a lot of help (maximum to moderate assistance). Can use assistive devices.   To walk in a hospital room? 2 - A Lot - Requires a lot of help (maximum to moderate assistance). Can use assistive devices.   Climbing 3-5 steps with a railing? 1 - Total - Requires total assistance or cannot do it at all.   AMPAC Mobility Score 13   TARGET Highest Level of Mobility Mobility Level 4, Transfer to chair   ACTUAL Highest Level of Mobility Mobility Level 4, Transfer to chair    Therapeutic Exercise  Therapeutic Exercise Comments Encouraged daily therapeutic exercise/repositioning. OOB to chair with assistance x 1. AROM x 4 extremities.    Neuromuscular Re-education  Neuromuscular Re-education Comments Static/dynamic sitting/standing balance. Standing balance limited by pain with radicular symptoms.    Therapeutic Functional Activity  Therapeutic Functional Activity Comments Bed mobility, transfers, positioning, patient education, discharge planning.    Clinical Impression  Follow up Assessment Patient with deficits of pain, range of motion, strength, balance, activity tolerance and endurance impairing functional mobility. Patient would benefit from physical therapy to address deficits to maximize functional mobility.   Criteria for Skilled Therapeutic Interventions Met treatment indicated    Patient/Family Stated Goals  Patient/Family Stated Goal(s) decrease pain    Frequency/Equipment Recommendations  PT Frequency 3x per week   Next Treatment Expected 12/14/22   PT/PTA completing this assessment KC    PT Recommendations for Inpatient Admission  Activity/Level of Assist out of bed;transfers only;assist of 2   Positioning reposition frequently   Therapeutic Exercise ROM as tolerated    Planned Treatment / Interventions  Education Treatment / Interventions Patient Education / Training    PT  Discharge Summary  Physical Therapy Disposition Recommendation Short Term Rehab     Charlestine Massed, PT

## 2022-12-12 NOTE — Plan of Care
Plan of Care Overview/ Patient Status    Problem: Adult Inpatient Plan of CareGoal: Readiness for Transition of CareOutcome: Interventions implemented as appropriatePatient needing STR, PASRR completed (ID [id number]). Patient/family first choice is [facility] Referrals sent to [facility] and local to his home address SNFs. Awaiting responses. Bailey Mech, RN Care Management Department [facility]

## 2022-12-12 NOTE — Progress Notes
[facility]	 [facility]	Medicine Progress NoteAttending Physician: Hollace Hayward May, MDLength of Stay: 3 daysSubjective/Interim: Stable LE paresthesiasNo incontinence or saddle anesthesiaHas been doing home PT exercises in bed, enthusiastic about working with them inpatientObjective: Vitals:Temp:  [97.6 ?F (36.4 ?C)-98.4 ?F (36.9 ?C)] 97.6 ?F (36.4 ?C)Pulse:  [71-81] 80Resp:  [18-19] 19BP: (155-167)/(83-89) 162/83SpO2:  [94 %-97 %] 94 %Device (Oxygen Therapy): room air  I/O's:No intake or output data in the 24 hours ending 12/11/22 1058 Physical Exam:Alert and interactive, no distressRRR no m/r/gLungs clear, normal WOBAbdo soft NTNDLE strength fullWWPLABS:ChemistryRecent Labs Lab 08/14/241145 08/16/241014 08/17/240516 08/18/240521 NA 134* 137 135* 137 K 4.2 4.2 4.3 4.4 CL 97* 101 101 102 CO2 28 26 24 24  BUN 23 24* 21 24* CREATININE 0.82 0.80 0.84 0.84 ANIONGAP 9 10 10 11  CALCIUM 9.3 8.9 9.0 9.0  CBCRecent Labs Lab 08/14/241145 08/16/241014 08/17/240516 08/18/240521 WBC 20.9* 8.5 9.5 7.3 NEUTROPHILS 91.2* 79.8* 79.7* 73.6* HGB 16.3 15.8 16.7 15.8 HCT 48.80 49.00 50.00 48.10 PLT 195 189 212 192 MCV 93.3 94.6 94.5 95.1  LFTsNo results for input(s): ALT, AST, ALKPHOS, BILITOT, BILIDIR, PROT, ALBUMIN in the last 168 hours.No results for input(s): LIPASE in the last 168 hours. CoagsNo results for input(s): PTT, LABPROT, INR, FIBRINOGEN in the last 168 hours. GlucoseRecent Labs   08/16/241014 08/17/240516 08/18/240521 GLU 120* 96 87  Lactate, Procal, D-dimer, BNP, Trop, CRPRecent Labs Lab 08/15/240328 HSCRP 74.4*  ABG:No results for input(s): PHART, PCO2ART, PO2ART, HCO3ART, O2SATART, LITERFLOW in the last 168 hours. JJO:ACZYSA Labs Lab 08/14/241848 PHVEN 7.46* PCO2VEN 42 PO2VEN 50  UrineNo results for input(s): SPECGRAV, PHUR, BLOODU, PROTEINUA, GLUCOSEUR, KETONESU, BILIRUBINUR, UROBILINOGEN, LEUKOCYTESUR, WBCUA, NITRITE, BACTERIA, EPITHELIALCE, RBCUA, COGRCA in the last 72 hours. 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 No evidence of cord compression/cauda equina. Seen by IR 8/17, not candidate for bone biopsy. - Continue CTX, Flagyl, Vancomycin- PICC consult Monday- PT - Appreciate ID and Orthopaedics# Chronic- Cont ASA, Crestor, ZyrtecDiet: Diet Regular Last stool: Last Bowel Movement: 08/16/24Access/Lines: PIVDVT PPx: LovenoxDispo:  Pending PICC placement, PT recommendationsCode status: FULL Caswell Corwin, M.D.[facility] Internal Medicine PGY-38/18/2024

## 2022-12-12 NOTE — Other
PICC SERVICE ADDITIONAL INFORMATIONLine placed on 8/19/2024Integrity of catheter confirmed: yes.Tip placement: superior vena cava.Internal measurement 47 cm, external measurement 0 cm, mid arm circumference 31 cm. Teaching and education materials provided to patient.Derwood Kaplan, RN

## 2022-12-12 NOTE — Plan of Care
Plan of Care Overview/ Patient Status    0700-1900Pt is a male. Pt with moderate back pain at this time, worsening with activity. PRN oxy and scheduled tylenol administered with good effect.Neuro A&O x4. CV: VSS. No complaints of chest pain.Resp: RA. No shortness of breath noted.GI Last Bowel Movement: 12/09/22. Continent of bowel.GU  Continent of urine. Urinal utilized at bedside.Skin: See skin flowsheet. T+P q2h, wedge in use, no. Skin check performed. Pt remains on pressure reduction mattress and heels elevated off bed. Ax1-2 in bed.Restraints: NoneAccess Periph IV 12/09/22 0353  over-the-needle catheter system 22 gauge 1 in length Radiology Tech (Active) SiteCare/Dressing Status/Securement dressing dry and intact 12/12/22 0940 Date Dressing Applied/Changed 12/10/22 12/11/22 2130 Next Date Dressing Change 12/17/2022 12/11/22 2130 Lumen 1 Patency/Care Patent;flushed w/o difficulty 12/12/22 0940 Site Signs asymptomatic without redness, warmth, swelling, pain, palpable cord, streak formation, drainage, dressing intact 12/12/22 0940 Phlebitis 0-->no symptoms 12/12/22 0940 Infiltration/Extravasation Assessment 0-->no symptoms 12/12/22 0940 Patient Education Instructed to keep IV site dry;Instructed to call nurse if site is painful,red,swollen, burning;Instructed to call nurse if fluid leaking from site 12/12/22 0940 Daily Review of Necessity ** completed 12/12/22 0940 Mobility: AMPAC Mobility Score: 12 Ax2 OOB T&P to chair.Bed alarm in place, all safety measures implemented.Isolation Precautions: NoneSignificant events from this shift: NoneProblem: Adult Inpatient Plan of CareGoal: Plan of Care ReviewFlowsheets (Taken 12/12/2022 1348)Plan of Care Reviewed With: patientGoal: Optimal Comfort and WellbeingIntervention: Provide Person-Centered CareFlowsheets (Taken 12/12/2022 1348)Trust Relationship/Rapport: care explained choices provided questions answered questions encouraged reassurance provided thoughts/feelings acknowledged Problem: Fall Injury RiskGoal: Absence of Fall and Fall-Related InjuryIntervention: Promote Injury-Free EnvironmentFlowsheets (Taken 12/12/2022 0940)Universal Fall Prevention: activity supervised keep room clutter free with open pathway to the bathroom assistive device/personal items within reach fall prevention program maintained keep bed in low position with > 1 bottom side rails down mobility aid in reach room organization consistent nonskid shoes/slippers when out of bed safety round/check completed Problem: InfectionGoal: Absence of Infection Signs and SymptomsIntervention: Prevent or Manage InfectionFlowsheets (Taken 12/12/2022 1348)Fever Reduction/Comfort Measures: medication administered

## 2022-12-12 NOTE — Progress Notes
INFECTIOUS DISEASES INPATIENT PROGRESS NOTE Patient's name:Carlos Caldwell Referring provider: Attending Provider: Hollace Hayward May, MD [phone] Admitted: 8/14/2024Date of service: 8/18/2024Interval History: [name] offers no complaints Still with pain when he ambulates. He was evaluated by IR for epidural abscess, but collection was thought to be too small/ difficult to access. He was also re-eval for possible bone biopsy, but felt not to have lesion amenable to biopsy..Past Medical History, Family History, Social History, Immunization, Allergies: have all been reviewed and remains unchanged Antimicrobial MedicationsCurrent Facility-Administered Medications Medication Dose Route Frequency  cefTRIAXone  2 g Intravenous Q24H  metroNIDAZOLE  500 mg Oral Q8H  vancomycin  1.25 g Intravenous Q12H Physical ExaminationTemp:  [97.6 ?F (36.4 ?C)-98.4 ?F (36.9 ?C)] 97.6 ?F (36.4 ?C)Pulse:  [71-81] 80Resp:  [18-19] 19BP: (155-167)/(83-89) 162/83SpO2:  [94 %-97 %] 94 %Device (Oxygen Therapy): room airHEENT: NCATGeneral: lying flat on back in no acute distressCV: RRR, normal S1 S2 II/VI SMPulm: CTA, no wheezing or rales, normal effortGI: soft, ND, NT, no rebound/guardingExtr: No edemaNeuro: alert, conversant, + straight leg raise.LABORATORY TESTSRecent Labs   08/16/241014 08/17/240516 08/18/240521 WBC 8.5 9.5 7.3 RBC 5.18 5.29 5.06 HGB 15.8 16.7 15.8 HCT 49.00 50.00 48.10 MCV 94.6 94.5 95.1 PLT 189 212 192 NEUTROPHILS 79.8* 79.7* 73.6* LYMPHOCYTES 9.8* 9.4* 14.4* MONOCYTES 7.9 8.1 8.8 EOSINOPHILS 1.3 1.6 2.3 Recent Labs Lab 08/16/241014 08/17/240516 08/18/240521 NA 137 135* 137 K 4.2 4.3 4.4 CL 101 101 102 CO2 26 24 24  BUN 24* 21 24* CREATININE 0.80 0.84 0.84 CALCIUM 8.9 9.0 9.0 GLU 120* 96 87  Estimated Creatinine Clearance: 85 mL/min (by C-G formula based on SCr of 0.84 mg/dL).Microbiology/VirologyRecent Labs Lab 08/15/240604 LABBLOO No Growth to Date 12/08/22	MRSA screen	 Not detectedRadiologyMRI Cervical Spine with and without IV ContrastResult Date: 12/09/2022  No evidence of discitis/osteomyelitis in the cervical or thoracic spine. Multilevel degenerative changes in the cervical spine, without significant spinal canal stenosis. Multilevel degenerative changes in the thoracic spine resulting in severe spinal canal stenosis at T10-11 level, resulting in mild cord compression. No definite cord signal abnormality, within limitations of motion artifact. Moderate spinal canal stenosis at T9-10 and T11-12 levels. Nonspecific mild dural/epidural enhancement in the lower thoracic spine at T9-T11 levels, probably from epidural venous congestion, could be reactive to infection/epidural abscess in the lower lumbar spine. No epidural collection or abscess is seen. [facility] Radiology Notify System Classification: Urgent result. Reported and signed by: Dion Saucier, MD  [facility] Radiology and Biomedical Imaging MRI Thoracic Spine with and without IV ContrastResult Date: 12/09/2022  No evidence of discitis/osteomyelitis in the cervical or thoracic spine. Multilevel degenerative changes in the cervical spine, without significant spinal canal stenosis. Multilevel degenerative changes in the thoracic spine resulting in severe spinal canal stenosis at T10-11 level, resulting in mild cord compression. No definite cord signal abnormality, within limitations of motion artifact. Moderate spinal canal stenosis at T9-10 and T11-12 levels. Nonspecific mild dural/epidural enhancement in the lower thoracic spine at T9-T11 levels, probably from epidural venous congestion, could be reactive to infection/epidural abscess in the lower lumbar spine. No epidural collection or abscess is seen. [facility] Radiology Notify System Classification: Urgent result. Reported and signed by: Dion Saucier, MD  [facility] Radiology and Biomedical Imaging MRI Lumbar Spine with and without IV ContrastResult Date: 8/15/2024Abnormal signal and enhancement compatible with bilateral L5-S1 septic facet joint arthritis, with mild signal abnormality and enhancement about the L5-S1 endplates also suggestive of discitis. There is prominent circumferential epidural phlegmon and small septated abscesses, in the lower lumbar spine spanning from L4  through S1-S2 levels resulting in severe spinal canal stenosis. Recommend urgent spine surgery consultation. Above findings were communicated to and acknowledged by Posey Pronto at 12/08/2022 1:56 AM. [facility] Radiology Notify System Classification: Clinical team aware. Reported and signed by: Dion Saucier, MD  [facility] Radiology and Biomedical Imaging CXRResult Date: 8/14/2024No acute findings. [facility] Radiology Notify System Classification:  Routine. 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 No acute fracture or subluxation. [facility] Radiology Notify System Classification: Routine. Report initiated by:  Catha Brow, MD Reported and signed by: Bradly Bienenstock, MD  [facility] Radiology and Biomedical Imaging   ASSESSMENT/ RECOMMENDATIONS:77 y.o. with h/o bilateral hip replacements, kidney stone, hyperlipidemia, remote history of basal cell carcinoma history peripheral neuropathy, who presented on 08/14 with lower back pain for 2 weeks. Found to have f L5-S1 septic facet joint arthritis, L5-S1 diskitis, L4 to S2 prominent circumferential epidural phlegmon and small septated abscesses. Evaluated by IR for  possible aspiration fo phlegmon or abscess areas; however, on exam by IR collections too small to tap. TTE without evidence of vegetations. IR re-evaluated and were unable to find area to biopsy vertebral body; thus, we will continue to treat empirically.Will need 6 weeks of antibiotics with ceftriaxone vancomycin metronidazole for epidural abscess/ OMWill need fu imaging toward end of treatment course to determine progression/ resuolution.Will set up outpatient antibiotic therapy planWill sign off. Please call us back if any further questions or concerns.Thank you for allowing [place] to participate in Carlos Caldwell's care.   Electronically Signed:Khaalid Lefkowitz S Jonatha Gagen, MDSection of Infectious DiseasesYale School of Medicine8/17/2024  12:59 AM

## 2022-12-12 NOTE — Other
PICC LINE INSERTION NOTEPICC 12/12/22 1928 Double Lumen Left basilic vein (medial side of arm) 5 Fr (Active) Is there a CVAD maintenance order for this central line? Yes 12/12/22 1900 SiteCare/Dressing Status/Securement 2% Chlorhexadine /70% alcohol;antimicrobial dressing applied;transparent semipermeable dressing applied;catheter securement device utilized;occlusive dressing 12/12/22 1900 Next date - dressing change 12/19/22 12/12/22 1900 Catheter Length Distal to Insertion Site (cm) 0 CM 12/12/22 1900 Lumen 1 Color purple 12/12/22 1900 Lumen 1 Use Capped 12/12/22 1900 Lumen 1 Patency/Care flushed w/o difficulty;blood return present 12/12/22 1900 Lumen 2 Color red 12/12/22 1900 Lumen 2 Use Capped 12/12/22 1900 Lumen 2 Patency/Care flushed w/o difficulty;blood return present 12/12/22 1900 Mid-Upper Arm Circumference (cm) 31 12/12/22 1900 Site Signs asymptomatic without redness, warmth, swelling, pain, palpable cord, streak formation, drainage, dressing intact 12/12/22 1900 Infiltration/Extravasation Assessment 0-->no symptoms 12/12/22 1900 Indication/Daily Review of Necessity Nursing Multiple or Long Term IV Medications 12/12/22 1900 Order received and documents reviewed. Reason: Long Term IV therapy.Informed consent from patient  was obtained for the procedure. Infection risk was discussed. Timeout procedure was performed.Inserted to left  basilic vein per hospital protocol using ultrasound guided technology. PATIENT ADAMANT ABOUT HAVING PICC ONLY IN LEFT ARM. UNABLT TO PASS PICC VIA R BRACHIAL VEIN. Inserted by Charlton Haws, RN & Hubbard Robinson.Blood return: yes.Catheter inserted to 47cm, with 0 cm exposed. Mid upper circumference is 31 cm. Catheter was flushed with 30 cc NS. Patient  did tolerate procedure well.Skin nick yes.Education materials given to patient.Derwood Kaplan, RN

## 2022-12-13 DIAGNOSIS — Q2112 Patent foramen ovale: Secondary | ICD-10-CM

## 2022-12-13 DIAGNOSIS — Z85828 Personal history of other malignant neoplasm of skin: Secondary | ICD-10-CM

## 2022-12-13 DIAGNOSIS — Z87442 Personal history of urinary calculi: Secondary | ICD-10-CM

## 2022-12-13 DIAGNOSIS — E785 Hyperlipidemia, unspecified: Secondary | ICD-10-CM

## 2022-12-13 DIAGNOSIS — M47816 Spondylosis without myelopathy or radiculopathy, lumbar region: Secondary | ICD-10-CM

## 2022-12-13 DIAGNOSIS — Z96643 Presence of artificial hip joint, bilateral: Secondary | ICD-10-CM

## 2022-12-13 DIAGNOSIS — E871 Hypo-osmolality and hyponatremia: Secondary | ICD-10-CM

## 2022-12-13 DIAGNOSIS — G600 Hereditary motor and sensory neuropathy: Secondary | ICD-10-CM

## 2022-12-13 DIAGNOSIS — Z1152 Encounter for screening for COVID-19: Secondary | ICD-10-CM

## 2022-12-13 DIAGNOSIS — Z7952 Long term (current) use of systemic steroids: Secondary | ICD-10-CM

## 2022-12-13 DIAGNOSIS — G061 Intraspinal abscess and granuloma: Secondary | ICD-10-CM

## 2022-12-13 DIAGNOSIS — M4647 Discitis, unspecified, lumbosacral region: Secondary | ICD-10-CM

## 2022-12-13 DIAGNOSIS — Z7982 Long term (current) use of aspirin: Secondary | ICD-10-CM

## 2022-12-13 DIAGNOSIS — M4807 Spinal stenosis, lumbosacral region: Secondary | ICD-10-CM

## 2022-12-13 DIAGNOSIS — Z79899 Other long term (current) drug therapy: Secondary | ICD-10-CM

## 2022-12-13 DIAGNOSIS — R3129 Other microscopic hematuria: Secondary | ICD-10-CM

## 2022-12-13 LAB — CBC WITH AUTO DIFFERENTIAL
BKR WAM ABSOLUTE IMMATURE GRANULOCYTES.: 0.04 x 1000/ÂµL (ref 0.00–0.30)
BKR WAM ABSOLUTE LYMPHOCYTE COUNT.: 0.83 x 1000/ÂµL (ref 0.60–3.70)
BKR WAM ABSOLUTE NRBC (2 DEC): 0 x 1000/ÂµL (ref 0.00–1.00)
BKR WAM ANC (ABSOLUTE NEUTROPHIL COUNT): 5.83 x 1000/ÂµL (ref 2.00–7.60)
BKR WAM BASOPHIL ABSOLUTE COUNT.: 0.05 x 1000/ÂµL (ref 0.00–1.00)
BKR WAM BASOPHILS: 0.7 % (ref 0.0–1.4)
BKR WAM EOSINOPHIL ABSOLUTE COUNT.: 0.17 x 1000/ÂµL (ref 0.00–1.00)
BKR WAM EOSINOPHILS: 2.2 % (ref 0.0–5.0)
BKR WAM HEMATOCRIT (2 DEC): 50.2 % — ABNORMAL HIGH (ref 38.50–50.00)
BKR WAM HEMOGLOBIN: 16.3 g/dL (ref 13.2–17.1)
BKR WAM IMMATURE GRANULOCYTES: 0.5 % (ref 0.0–1.0)
BKR WAM LYMPHOCYTES: 10.9 % — ABNORMAL LOW (ref 17.0–50.0)
BKR WAM MCH (PG): 30.8 pg (ref 27.0–33.0)
BKR WAM MCHC: 32.5 g/dL (ref 31.0–36.0)
BKR WAM MCV: 94.7 fL (ref 80.0–100.0)
BKR WAM MONOCYTE ABSOLUTE COUNT.: 0.66 x 1000/ÂµL (ref 0.00–1.00)
BKR WAM MONOCYTES: 8.7 % (ref 4.0–12.0)
BKR WAM MPV: 9.7 fL (ref 8.0–12.0)
BKR WAM NEUTROPHILS: 77 % — ABNORMAL HIGH (ref 39.0–72.0)
BKR WAM NUCLEATED RED BLOOD CELLS: 0 % (ref 0.0–1.0)
BKR WAM PLATELETS: 205 x1000/ÂµL (ref 150–420)
BKR WAM RDW-CV: 13.1 % (ref 11.0–15.0)
BKR WAM RED BLOOD CELL COUNT.: 5.3 M/ÂµL (ref 4.00–6.00)
BKR WAM WHITE BLOOD CELL COUNT: 7.6 x1000/ÂµL (ref 4.0–11.0)

## 2022-12-13 LAB — BASIC METABOLIC PANEL
BKR ANION GAP: 10 (ref 7–17)
BKR BLOOD UREA NITROGEN: 20 mg/dL (ref 8–23)
BKR BUN / CREAT RATIO: 25.3 — ABNORMAL HIGH (ref 8.0–23.0)
BKR CALCIUM: 9 mg/dL (ref 8.8–10.2)
BKR CHLORIDE: 99 mmol/L (ref 98–107)
BKR CO2: 27 mmol/L (ref 20–30)
BKR CREATININE: 0.79 mg/dL (ref 0.40–1.30)
BKR EGFR, CREATININE (CKD-EPI 2021): >60 mL/min/{1.73_m2} (ref >=60)
BKR GLUCOSE: 103 mg/dL — ABNORMAL HIGH (ref 70–100)
BKR POTASSIUM: 4.3 mmol/L (ref 3.3–5.3)
BKR SODIUM: 136 mmol/L (ref 136–144)

## 2022-12-13 LAB — BLOOD CULTURE   (BH GH L LMW YH): BKR BLOOD CULTURE: NO GROWTH

## 2022-12-13 MED ORDER — CYCLOBENZAPRINE 10 MG TABLET
10 | ORAL_TABLET | Freq: Three times a day (TID) | ORAL | 1 refills | Status: AC | PRN
Start: 2022-12-13 — End: ?

## 2022-12-13 MED ORDER — CEFTRIAXONE IV PUSH 2000 MG VIAL & NS (ADULTS)
INTRAVENOUS | Status: AC
Start: 2022-12-13 — End: ?

## 2022-12-13 MED ORDER — POLYETHYLENE GLYCOL 3350 17 GRAM ORAL POWDER PACKET
17 | Freq: Every day | ORAL | 3 refills | Status: AC
Start: 2022-12-13 — End: ?

## 2022-12-13 MED ORDER — OXYCODONE IMMEDIATE RELEASE 5 MG TABLET
5 | ORAL_TABLET | Freq: Four times a day (QID) | ORAL | 1 refills | Status: AC | PRN
Start: 2022-12-13 — End: ?

## 2022-12-13 MED ORDER — METRONIDAZOLE 500 MG TABLET
500 | ORAL_TABLET | Freq: Three times a day (TID) | ORAL | 1 refills | Status: AC
Start: 2022-12-13 — End: ?

## 2022-12-13 MED ORDER — VANCOMYCIN IVPB (1.25 G IN 250ML NS)
Freq: Two times a day (BID) | INTRAVENOUS | Status: AC
Start: 2022-12-13 — End: ?

## 2022-12-13 NOTE — Care Coordination-Inpatient
INSURANCE AUTH OBTAINED FOR STR 12/13/22 1610 Authorization Information Date Authorization Initiated:  12/13/22 Time Authorization Initiated: 0826 Mode Clinical was sent: Portal Facility Name [facility] Facility Authorization yes Insurance Company [employer] [place] [employer] Contact Name/# [employer] Authorization #/Details [id number], Level 1, 13 days, 8/20-9/1, NRD 9/2 Date Authorization recieved 12/13/22 Time Authorization recieved 1000 Follow up contact necessary Yes Contact Name Darylene Price Contact phone: [phone] Insurance Co. Fax number: [phone]

## 2022-12-13 NOTE — Plan of Care
Plan of Care Overview/ Patient Status    0700-1320Pt is a male. Pt with moderate back pain at this time, worsening with activity, denies need for further pain management at this time besides scheduled tylenol. Neuro A&O x4.  CV: VSS. No complaints of chest pain. Resp: RA. No shortness of breath noted. GI Last Bowel Movement: 12/13/22. Continent of bowel. GU  Continent of urine. Urinal utilized at bedside. Skin: See skin flowsheet. T+P q2h, wedge in use, no. Skin check performed. Pt remains on pressure reduction mattress and heels elevated off bed. Ax1-2 in bed. Restraints: None.Access PICC 12/12/22 1928 Double Lumen Left basilic vein (medial side of arm) 5 Fr (Active) Is there a CVAD maintenance order for this central line? Yes 12/13/22 0750 SiteCare/Dressing Status/Securement dressing dry and intact 12/13/22 0750 Date dressing changed 12/12/22 12/12/22 2048 Next date - dressing change 12/19/22 12/12/22 2048 Catheter Length Distal to Insertion Site (cm) 0 CM 12/12/22 1900 Lumen 1 Color purple 12/13/22 0750 Lumen 1 Use Capped 12/13/22 0750 Lumen 1 Patency/Care flushed w/o difficulty;blood return present 12/13/22 0750 Lumen 2 Color red 12/13/22 0750 Lumen 2 Use Capped 12/13/22 0750 Lumen 2 Patency/Care flushed w/o difficulty;blood return present 12/13/22 0750 Mid-Upper Arm Circumference (cm) 31 12/12/22 1900 Site Signs swelling 12/12/22 2048 Infiltration/Extravasation Assessment 0-->no symptoms 12/13/22 0750 Indication/Daily Review of Necessity Nursing Multiple or Long Term IV Medications 12/13/22 0750 Mobility: AMPAC Mobility Score: 13Bed alarm in place, all safety measures implemented.Isolation Precautions:NoneSignificant events from this shift:Cephus Erisman was discharged via Ambulance accompanied by Alone.  Verbalized understanding of discharge instructionsand recommended follow up care as per the after visit summary.  Written discharge instructions provided. Denies any further questions. Vital signs    Vitals:  12/12/22 0738 12/12/22 1415 12/12/22 2321 12/13/22 0746 BP: (!) 155/90 (!) 161/86 (!) 151/82 138/89 Pulse: 80 82 81 83 Resp: 19 19 18 19  Temp: 98.5 ?F (36.9 ?C) 98.5 ?F (36.9 ?C) 98.8 ?F (37.1 ?C) 98.3 ?F (36.8 ?C) TempSrc: Oral Oral Oral Oral SpO2: 95% 95% 94% 94% Weight:     Height:     Patient confirmed all belongings returned. Belongings charted in last 7 days: Patient Valuables   Patient Valuables Flowsheet                    PATIENT VALUABLE(S)       Clothing Disposition At bedside/locker/closet 12/08/22 1705  Vision Disposition At bedside/locker/closet 12/08/22 1705   Cell phone disposition At bedside/locker/closet 12/08/22 1705  Jewelry disposition At bedside/locker/closet 12/08/22 1705   Other disposition At bedside/locker/closet 12/08/22 1705     Report given to Heather RN at [employer] RN made aware of need for PFO tubing, patient sent with necessary tubing upon discharge.Problem: Adult Inpatient Plan of CareGoal: Plan of Care ReviewOutcome: Outcome(s) achievedGoal: Patient-Specific Goal (Individualized)Outcome: Outcome(s) achievedGoal: Absence of Hospital-Acquired Illness or InjuryOutcome: Outcome(s) achievedGoal: Optimal Comfort and WellbeingOutcome: Outcome(s) achievedGoal: Readiness for Transition of CareOutcome: Outcome(s) achieved Problem: Physical Therapy GoalsGoal: Physical Therapy GoalsDescription: PT GOALS1. Patient will perform bed mobility independently2. Patient will perform transfers with least assistive device with modified independence3. Patient will ambulate a minimum of 150 feet with least assistive device with modified independence4. Patient will ascend and descend at least 5 steps with modified independence Outcome: Outcome(s) achieved Problem: Skin Injury Risk IncreasedGoal: Skin Health and IntegrityOutcome: Outcome(s) achieved Problem: Fall Injury RiskGoal: Absence of Fall and Fall-Related InjuryOutcome: Outcome(s) achieved Problem: Wound Healing ProgressionGoal: Optimal Wound HealingOutcome: Outcome(s) achieved Problem: InfectionGoal: Absence of Infection Signs and SymptomsOutcome: Outcome(s) achieved

## 2022-12-13 NOTE — Discharge Summary
[facility]urg Discharge SummaryPatient Data:  Patient Name: Carlos Caldwell Admit date: 12/07/2022 Age: 77 y.o. Discharge date: 12/13/2022 DOB: 1945/05/25	 Discharge Attending Physician: Gwen Pounds, MD  MRN: [id number]	 Discharged Condition: good PCP: Scherrie November, MD  Disposition: Skilled Nursing Facility for Short Term Rehab Principal Diagnosis: Spinal epidural abscess/plegmonSecondary diagnoses occurring during hospitalization:Hyponatremia Post Discharge Follow Up Items: Issues to be Addressed Post Discharge:Continue CTX/flagyl/vanc for total of 6 week courseRepeat MRI near end of antibiotic courseF/u with ID clinicMedication changes on discharge (Full medication list at conclusion of this summary) :Current Discharge Medication List  Discontinued   naproxen sodium (ANAPROX) 220 MG tablet    New  Details cefTRIAXone (ROCEPHIN) 2 g in sodium chloride 0.9% PF 0.9% 20 mL (100 mg/mL) Inject 20 mLs (2 g total) into the vein every 24 hours.Start date: 12/14/2022, End date: 01/18/2023  metroNIDAZOLE (FLAGYL) 500 mg tablet Take 1 tablet (500 mg total) by mouth every 8 (eight) hours.Start date: 12/13/2022, End date: 01/18/2023  oxyCODONE (ROXICODONE) 5 mg Immediate Release tablet Take 1-2 tablets (5-10 mg total) by mouth every 6 (six) hours as needed for pain.Start date: 12/13/2022, End date: 01/18/2023  polyethylene glycol (MIRALAX) 17 gram packet Take 1 packet (17 g total) by mouth daily. Mix in 8 ounces of water, juice, soda, coffee or tea prior to taking.Start date: 12/14/2022  vancomycin (VANCOCIN) 1.25 g in sodium chloride 0.9% 250 mL IVPB Inject 1.25 g into the vein every 12 (twelve) hours.Start date: 12/13/2022, End date: 01/18/2023   Changed  Details cyclobenzaprine (FLEXERIL) 10 mg tablet Take 1 tablet (10 mg total) by mouth 3 (three) times daily as needed for muscle spasms for up to 10 days.Start date: 12/13/2022, End date: 12/23/2022   Pending Labs and Tests: Pending Lab Results   Order Current Status  Blood culture Preliminary result  Follow-up Information[facility] [address] [phone] Future Appointments Date Time Provider Department Center 01/10/2023  1:00 PM Farrel Demark D, MBBS COMPR INF [facility] CAD 06/22/2023  1:20 PM Scherrie November, MD [facility] Course: 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 He was seen by ortho-spine, who recommended non-operative management by medicine. He was started on empiric antibiotic coverage with CTX/flagyl/vancomycin, and ID was consulted. They requested IR to either get a fluid sample from the abscess or a bone biopsy. However, the collections were too small to safely drain for a sample, and there were no areas of the vertebrae themselves that looked infected for bone biopsy. Because of this, the decision was made to continue with 6 weeks of the aforementioned broad antibiotic coverage. He received his PICC on 8/19 without any complications.Because patient's mobility was so limited by pain, PT recommended short term rehab, which he and his wife both agreed with. He was accepted and approved for [facility] on 8/20 and discharged there.Inpatient Consultants and summary of recommendations:Infectious DiseaseOrtho - SpineInterventional RadiologyPertinent Procedures or Surgeries: N/AData: Pertinent lab findings:Recent Labs Lab 08/18/240521 08/19/240652 08/20/240554 WBC 7.3 6.9 7.6 HGB 15.8 16.1 16.3 HCT 48.10 49.90 50.20* PLT 192 201 205  Recent Labs Lab 08/18/240521 08/19/240652 08/20/240554 NEUTROPHILS 73.6* 75.7* 77.0*  Recent Labs Lab 08/18/240521 08/19/240652 08/20/240554 NA 137 135*  136 K 4.4 4.4 4.3 CL 102 99 99 CO2 24 25 27  BUN 24* 22 20 CREATININE 0.84 0.81 0.79 GLU 87 99 103* ANIONGAP 11 11 10   Recent Labs Lab 08/18/240521 08/19/240652 08/20/240554 CALCIUM 9.0 9.0 9.0  No results for input(s): ALT, AST, ALKPHOS, BILITOT, BILIDIR in the last 168 hours. No results for input(s): PTT, LABPROT, INR in the last 168 hours. Microbiology:Recent Labs Lab 08/15/240604 LABBLOO No Growth to Date Imaging: Imaging results last 1 week:  MRI Cervical Spine with and without IV ContrastResult Date: 12/09/2022  No evidence of discitis/osteomyelitis in the cervical or thoracic spine. Multilevel degenerative changes in the cervical spine, without significant spinal canal stenosis. Multilevel degenerative changes in the thoracic spine resulting in severe spinal canal stenosis at T10-11 level, resulting in mild cord compression. No definite cord signal abnormality, within limitations of motion artifact. Moderate spinal canal stenosis at T9-10 and T11-12 levels. Nonspecific mild dural/epidural enhancement in the lower thoracic spine at T9-T11 levels, probably from epidural venous congestion, could be reactive to infection/epidural abscess in the lower lumbar spine. No epidural collection or abscess is seen. [facility] Radiology Notify System Classification: Urgent result. Reported and signed by: Dion Saucier, MD  [facility] Radiology and Biomedical Imaging MRI Thoracic Spine with and without IV ContrastResult Date: 12/09/2022  No evidence of discitis/osteomyelitis in the cervical or thoracic spine. Multilevel degenerative changes in the cervical spine, without significant spinal canal stenosis. Multilevel degenerative changes in the thoracic spine resulting in severe spinal canal stenosis at T10-11 level, resulting in mild cord compression. No definite cord signal abnormality, within limitations of motion artifact. Moderate spinal canal stenosis at T9-10 and T11-12 levels. Nonspecific mild dural/epidural enhancement in the lower thoracic spine at T9-T11 levels, probably from epidural venous congestion, could be reactive to infection/epidural abscess in the lower lumbar spine. No epidural collection or abscess is seen. [facility] Radiology Notify System Classification: Urgent result. Reported and signed by: Dion Saucier, MD  [facility] Radiology and Biomedical Imaging MRI Lumbar Spine with and without IV ContrastResult Date: 8/15/2024Abnormal signal and enhancement compatible with bilateral L5-S1 septic facet joint arthritis, with mild signal abnormality and enhancement about the L5-S1 endplates also suggestive of discitis. There is prominent circumferential epidural phlegmon and small septated abscesses, in the lower lumbar spine spanning from L4 through S1-S2 levels resulting in severe spinal canal stenosis. Recommend urgent spine surgery consultation. Above findings were communicated to and acknowledged by Posey Pronto at 12/08/2022 1:56 AM. [facility] Radiology Notify System Classification: Clinical team aware. Reported and signed by: Dion Saucier, MD  [facility] Radiology and Biomedical Imaging CXRResult Date: 8/14/2024No acute findings. [facility] Radiology Notify System Classification:  Routine. Reported and signed by: Bradly Bienenstock, MD  [facility] Radiology and Biomedical Imaging [place] Lumbar Spine wo IV ContrastResult Date: 12/07/2022 Multilevel degenerative disease of the lumbar spine with severe left-sided neural foraminal stenosis at L5-S1. No acute fracture or subluxation. [facility] Radiology Notify System Classification: Routine. Report initiated by:  Catha Brow, MD Reported and signed by: Bradly Bienenstock, MD  [facility] Radiology and Biomedical Imaging  Diet:  Diet RegularMobility: Highest Level of mobility - ACTUAL: Mobility Level 4, Transfer to chair, AM PAC 10-15Physical Therapy Disposition Recommendation: Short Term RehabAdditional Physical Therapy Disposition Recommendations: Working towards home; Home Physical Therapy (pending improved pain control, otherwise may require STR)Physical Exam Discharge vital signs: Vitals:  12/13/22 0746 BP: 138/89 Pulse: 83 Resp: 19 Temp: 98.3 ?F (36.8 ?C) Cognitive Status at Discharge: Baseline Alert and Oriented x 3Physical ExamConstitutional:     Appearance: Normal  appearance. HENT:    Head: Normocephalic and atraumatic. Cardiovascular:    Rate and Rhythm: Normal rate and regular rhythm.    Heart sounds: No murmur heard.Pulmonary:    Effort: Pulmonary effort is normal. No respiratory distress.    Breath sounds: Normal breath sounds. Abdominal:    General: Abdomen is flat.    Palpations: Abdomen is soft. Musculoskeletal:       General: No swelling or tenderness. Skin:   General: Skin is warm. Neurological:    General: No focal deficit present.    Mental Status: He is alert and oriented to person, place, and time. Psychiatric:       Mood and Affect: Mood normal.       Behavior: Behavior normal. History  Allergies No Known Allergies PMH PSH Past Medical History: Diagnosis Date  BCC (basal cell carcinoma), ear, right   Patent foramen ovale   Peripheral neuropathy   Past Surgical History: Procedure Laterality Date  COLONOSCOPY  07/2014  JOINT REPLACEMENT    b/l hips  ORTHOPEDIC SURGERY    ROTATOR CUFF REPAIR Bilateral   TOTAL HIP ARTHROPLASTY  2000  TOTAL HIP ARTHROPLASTY  2003  VASCULAR SURGERY    L carotid endarterectomy  Social History Family History Social History Tobacco Use  Smoking status: Never  Smokeless tobacco: Never Substance Use Topics  Alcohol use: No  Family History Problem Relation Age of Onset  Heart failure Father       died CHF age 39    Discharge Medications  Discharge: Current Discharge Medication List  START taking these medications  Details cefTRIAXone (ROCEPHIN) 2 g in sodium chloride 0.9% PF 0.9% 20 mL (100 mg/mL) Inject 20 mLs (2 g total) into the vein every 24 hours.Start date: 12/14/2022, End date: 01/18/2023  metroNIDAZOLE (FLAGYL) 500 mg tablet Take 1 tablet (500 mg total) by mouth every 8 (eight) hours.Qty: 108 tablet, Refills: 0Start date: 12/13/2022, End date: 01/18/2023  oxyCODONE (ROXICODONE) 5 mg Immediate Release tablet Take 1-2 tablets (5-10 mg total) by mouth every 6 (six) hours as needed for pain.Qty: 8 tablet, Refills: 0Start date: 12/13/2022, End date: 01/18/2023  polyethylene glycol (MIRALAX) 17 gram packet Take 1 packet (17 g total) by mouth daily. Mix in 8 ounces of water, juice, soda, coffee or tea prior to taking.Qty: 14 each, Refills: 2Start date: 12/14/2022  vancomycin (VANCOCIN) 1.25 g in sodium chloride 0.9% 250 mL IVPB Inject 1.25 g into the vein every 12 (twelve) hours.Start date: 12/13/2022, End date: 01/18/2023   CONTINUE these medications which have CHANGED  Details cyclobenzaprine (FLEXERIL) 10 mg tablet Take 1 tablet (10 mg total) by mouth 3 (three) times daily as needed for muscle spasms for up to 10 days.Qty: 30 tablet, Refills: 0Start date: 12/13/2022, End date: 12/23/2022   CONTINUE these medications which have NOT CHANGED  Details aspirin 81 MG EC tablet Take 1 tablet (81 mg total) by mouth daily.  cholecalciferol (VITAMIN D) 1,000 unit tablet Take 1 tablet (1,000 Units total) by mouth daily.  fluticasone propionate (FLONASE) 50 mcg/actuation nasal spray SHAKE LIQUID AND USE 1 SPRAY IN EACH NOSTRIL DAILYQty: 48 g, Refills: 2  lidocaine (LIDODERM) 5 % Place 1 patch onto the skin every 24 hours. Remove & Discard patch within 12 hours or as directed by MDQty: 30 patch, Refills: 0  loratadine (CLARITIN) 10 mg tablet Take 1 tablet (10 mg total) by mouth.  Miscellaneous Medical Supply Use as directed.  Patient needs customize bilateral AFO braces.Dx G.60.0, R26.9, R23.8Qty: 1 each, Refills: 0  multivitamin capsule Take 1 capsule by mouth daily.  potassium citrate (UROCIT-K) 10 mEq (1,080 mg) extended release tablet   predniSONE (DELTASONE) 10 mg tablet 3 tabs x 4days, 2 tabs x 4days 1 tabs x 4day, then 0.5 tab x 4 days. Take with foodQty: 32 tablet, Refills: 0  sildenafiL (VIAGRA) 100 mg tablet TAKE 1 TABLET(100 MG) BY MOUTH DAILY AS NEEDED FOR ERECTILE DYSFUNCTIONQty: 18 tablet, Refills: 2  simvastatin (ZOCOR) 10 mg tablet TAKE 1 TABLET(10 MG) BY MOUTH EVERY NIGHTQty: 90 tablet, Refills: 3  zolpidem (AMBIEN) 10 mg tablet Take 1 tablet (10 mg total) by mouth nightly as needed.Rob Bunting: 90 tablet, Refills: 1   STOP taking these medications   naproxen sodium (ANAPROX) 220 MG tablet      Time: 45 minutes spent on the discharge of this patientElectronically Signed:Klare Criss Threasa Beards, MD 12/13/2022 Best Contact Information: MHB

## 2022-12-13 NOTE — Discharge Instructions
Dear Carlos Caldwell was a pleasure taking care of you here at [facility]. You were hospitalized for lower back pain. An MRI of your spine was performed and you were found to have an abscess/infection surrounding your spine. During your hospitalization, we started you in IV antibiotics and consulted our infectious disease and interventional radiology colleagues. Because there was no good place to a biopsy or fluid collection, we decided to continue you on empiric antibiotics with broad coverage. You will complete a total of six weeks of antibiotics. Your symptoms improved and you were discharged to short term rehab at [facility]. We will plan for a repeat MRI of your spine when you finish your six weeks of antibiotics.You may resume your home medications with the following changes/additions:Oxycodone 5-10mg  every six hours AS NEEDEDMiralax once dailyVancomycin (antibiotic) 1.25g every 12 hoursCeftriaxone (antibiotic) 2g daily via IVMetronidazole (antibiotic) 500mg  twice daily via POYou will follow up with:Future Appointments     Provider Department Center  01/10/2023 1:00 PM Samara Snide, MBBS [facility] for Infectious Disease [facility] CAD  06/22/2023 1:20 PM Scherrie November, MD [facility] [place] [facility] OUTPATI  Sincerely,Your care team

## 2022-12-13 NOTE — Care Coordination-Inpatient
PENDING INSURANCE AUTHORIZATION THIS PATIENT CANNOT BE DISCHARGED UNTIL DETERMINATION OBTAINEDSecured facility: [employer] has been initiated with this patient's insurer. Clinical information submitted to this patient's insurer for review today, via Av[employer] portal.  We now await the authorization that will allow this patient to transfer to the facility.   Insurance Contact: [employer] Phone Number: [phone] Number: [phone] Reference No.:  [id number]

## 2022-12-13 NOTE — Plan of Care
Problem: Adult Inpatient Plan of CareGoal: Optimal Comfort and WellbeingOutcome: Interventions implemented as appropriate Problem: Fall Injury RiskGoal: Absence of Fall and Fall-Related InjuryOutcome: Interventions implemented as appropriate Plan of Care Overview/ Patient Status  Pt is a male. Pt with no pain at this time. But, requested OxycodoneNeuro: conscious and awareCV: NADResp: NADGI last bowel movement 12/13/2022. Medicated with sennaGU  continent Skin: See skin flowsheet. Skin check performed.  Pt remains on pressure reduction mattress and heels elevated off bed. Access Periph IV 12/09/22 0353  over-the-needle catheter system 22 gauge 1 in length Radiology Tech (Active) SiteCare/Dressing Status/Securement dressing dry and intact 12/12/22 2048 Date Dressing Applied/Changed 12/10/22 12/12/22 2048 Next Date Dressing Change 12/17/2022 12/12/22 2048 Lumen 1 Patency/Care Patent;flushed w/o difficulty 12/12/22 2048 Site Signs asymptomatic without redness, warmth, swelling, pain, palpable cord, streak formation, drainage, dressing intact 12/12/22 2048 Phlebitis 0-->no symptoms 12/12/22 2048 Infiltration/Extravasation Assessment 0-->no symptoms 12/12/22 2048 Patient Education Instructed to keep IV site dry 12/12/22 2048 Daily Review of Necessity ** completed 12/12/22 2048   PICC 12/12/22 1928 Double Lumen Left basilic vein (medial side of arm) 5 Fr (Active) Is there a CVAD maintenance order for this central line? Yes 12/12/22 2048 SiteCare/Dressing Status/Securement 2% Chlorhexadine /70% alcohol 12/12/22 2048 Date dressing changed 12/12/22 12/12/22 2048 Next date - dressing change 12/19/22 12/12/22 2048 Catheter Length Distal to Insertion Site (cm) 0 CM 12/12/22 1900 Lumen 1 Color purple 12/12/22 1900 Lumen 1 Use Capped 12/12/22 1900 Lumen 1 Patency/Care flushed w/o difficulty;blood return present 12/12/22 1900 Lumen 2 Color red 12/12/22 1900 Lumen 2 Use Capped 12/12/22 1900 Lumen 2 Patency/Care flushed w/o difficulty;blood return present 12/12/22 1900 Mid-Upper Arm Circumference (cm) 31 12/12/22 1900 Site Signs swelling 12/12/22 2048 Infiltration/Extravasation Assessment 0-->no symptoms 12/12/22 2048 Indication/Daily Review of Necessity Nursing Multiple or Long Term IV Medications 12/12/22 2048 Mobility: AMPAC Mobility Score: 12Bed alarm in place, all safety measures implemented.Isolation Precautions: Isolation Precautions: protective environment maintainedSignificant events from this shift: uneventful shift spent

## 2022-12-13 NOTE — Plan of Care
Plan of Care Overview/ Patient Status    Problem: Adult Inpatient Plan of CareGoal: Readiness for Transition of CareOutcome: Outcome(s) achievedPatient discharging on 12/13/22 to STR at [facility], patient to be transported by ambulance. Patient and family in agreement with discharge plan. W10 is updated. Auth and PASRR is complete. Facility was notified of discharge.  Case Management Plan  Flowsheet Row Most Recent Value Discharge Planning  Patient/Patient Representative goals/treatment preferences for discharge are:  STR Patient/Patient Representative was presented with a list of facilities, agencies and/or dme providers and Referral(s) placed for: Short term rehabilitation (at a nursing facility) Bed has been secured and is available on: 12/13/22 Facility Name [facility] Mode of Transportation  Ambulance (add comment for special considerations) CM D/C Readiness  PASRR completed and approved Yes Authorization number obtained, if required Yes Is there a 3 day INPATIENT Qualifying stay for Medicare Patients? Yes Medicare IM- signed, dated, timed and scanned, if required Yes DME Authorized/Delivered N/A No needs identified/ follow up with PCP/MD N/A Post acute care services secured W10 complete Yes Pri Completed and Accepted  N/A Is the destination address correct on the W10 Yes Finalized Plan  Expected Discharge Date 12/13/22 Discharge Disposition Skilled Nursing Facility  Bailey Mech, RN Care Management Department [facility]

## 2022-12-14 ENCOUNTER — Encounter: Admit: 2022-12-14 | Payer: PRIVATE HEALTH INSURANCE | Attending: Emergency Medicine | Primary: Family Medicine

## 2022-12-14 ENCOUNTER — Encounter: Admit: 2022-12-14 | Payer: PRIVATE HEALTH INSURANCE | Primary: Family Medicine

## 2022-12-14 ENCOUNTER — Telehealth: Admit: 2022-12-14 | Payer: PRIVATE HEALTH INSURANCE | Primary: Family Medicine

## 2022-12-14 DIAGNOSIS — G062 Extradural and subdural abscess, unspecified: Secondary | ICD-10-CM

## 2022-12-14 NOTE — Progress Notes
[facility] OPAT Documentation NoteTRANSITION OF CARE                                                                     OPAT Synopsis can be found Here Facility Details The following was verified with: Agency  Other [facility]   Contact #  [phone]  Episode Details Diagnosis  CNS Infection and Osteomyelitis  Date of Hospital Discharge  December 13, 2022  Antibiotics  Vancomycin  Dose: 1.25g  Route: IV Frequency: Every 12 hours  End Date:  January 18, 2023 Additional Antibiotic: Yes  Ceftriaxone  Dose: 2g  Route: IV Frequency: q24h  End Date:  January 18, 2023 Additional Antibiotic: Yes  Metronidazole  Dose: 500mg   Route: Oral Frequency: q8h  End Date:  January 18, 2023 Additional Antibiotic: No  Oral antibiotic reviewed    with  patient?  Not Applicable  Cultures in need of  monitoring? No  Line Access  PICC  Weekly Labs  CBC, BMP, Vancomycin Level, ESR, and CRP  Lab Result Agency  [facility]  OPAT Clinic Site and   Contact #  YCID: ([phone])  Provider  Dr Noralee Chars  Follow-up Date  9/17 If follow-up imaging is needed, please ensure it is scheduled prior to antibiotic completionNext vanco trough scheduled for 8/23Were any discrepancies or adverse events identified?No Author's Role		Pharmacist Total Time Spent on this Transition (in minutes)30 minutes Ronita Hipps, PharmD, BCPSClinical Pharmacist8/21/2024

## 2022-12-14 NOTE — Telephone Encounter
error 

## 2022-12-15 ENCOUNTER — Telehealth: Admit: 2022-12-15 | Payer: PRIVATE HEALTH INSURANCE | Attending: Infectious Disease | Primary: Family Medicine

## 2022-12-15 NOTE — Telephone Encounter
Received call from [facility] stating she would like to know about Armoni blood work that Dr. B placed for him please call [phone] ext [phone]

## 2022-12-15 NOTE — Telephone Encounter
[facility] OPAT Documentation NoteALL OTHER PHONE CALLS (for other adverse events, missing labs, etc)   OPAT Synopsis can be found Here Reason for CallOTHER                                   Other Issue: spoke with Heather.  Pt will need a weekly CBC,CMP,CRP.  Labs will be done on Thursdays-facility's lab day.  Facility able to have vancomycin trough done any day during the week.  Confirmed vancomycin dosing will be determined by OPAT pharmacist.	Antibiotics Implicated (select all that apply) : Not Applicable Affiliated Agency: Other [place]  Author's Role		RN Total Time Spent on this Encounter (in minutes)10 minutes

## 2022-12-16 ENCOUNTER — Telehealth: Admit: 2022-12-16 | Payer: PRIVATE HEALTH INSURANCE | Primary: Family Medicine

## 2022-12-16 ENCOUNTER — Encounter: Admit: 2022-12-16 | Payer: PRIVATE HEALTH INSURANCE | Attending: Family Medicine | Primary: Family Medicine

## 2022-12-16 NOTE — Telephone Encounter
[facility] OPAT Documentation NoteVANCOMYCIN MONITORING                                                            OUTPATIENT PARENTERAL ANTIBIOTIC THERAPY MONITORING: VANCOMYCIN OPAT Synopsis can be found Here  Current Vancomycin Order: 1.25 g IV every 12 hours Anticipated vancomycin discontinuation date: 01/18/23 Renal Function:  StableDate Scr  8/23 Not drawn Level:Date Vanc Level Vanc Dose 8/23 11.3 1.25g q12h Type of Level:  TroughTrough Goal: 10-15Source:  VerbalBased on vancomycin level, would: Continue current regimenRepeat level recommended: rest of weekly labs were not drawn on 8/23 and rescheduled for 8/26. Weekly labs and vanco trough will then be scheduled for Thursdays going forward.Communicated with:   Other [place] [address]  [place], RN)Adverse events identified?  No Time Spent on this Encounter: 30 minutes Dario Guardian, PharmDRoll: Pharmacist1:01 PM

## 2022-12-19 ENCOUNTER — Telehealth: Admit: 2022-12-19 | Payer: PRIVATE HEALTH INSURANCE | Attending: Infectious Disease | Primary: Family Medicine

## 2022-12-19 NOTE — Telephone Encounter
[facility] OPAT Documentation NoteALL OTHER PHONE CALLS (for other adverse events, missing labs, etc)   OPAT Synopsis can be found Here Reason for CallOTHER                                   Other Issue: spoke with Tabitha.  Ok to leave MRI as scheduled-as close to end date of antibiotics.  Will keep follow up with Dr. Noralee Chars on 01/10/2023.  Verified MRI timing and instructions.	Antibiotics Implicated (select all that apply) : Not Applicable Affiliated Agency: Other [place]  Author's Role		RN Total Time Spent on this Encounter (in minutes)10 minutes

## 2022-12-19 NOTE — Telephone Encounter
Received call from Tabathia stating that Carlos Caldwell has a MRI on the 30th of Sept but also has an appointment with Dr. Leonard Schwartz on the 17th of Sept wants to know if the MRI should be before the appt.please advise and [phone] ext [phone]

## 2022-12-20 ENCOUNTER — Encounter: Admit: 2022-12-20 | Payer: PRIVATE HEALTH INSURANCE | Attending: Family Medicine | Primary: Family Medicine

## 2022-12-20 NOTE — Other
[place] [facility] Ambulatory OPAT Documentation NoteLAB REVIEW                                                                                      OPAT Synopsis can be found Here Summary of ResultsDate of lab draw: 8/26/24Cr= 0.74 (from: 0.79)WBC=  6.3 (from: 7.6)Hg/HCT= 16.5/50.5  (from: 16.3/50.2)CRP=  24.9 (from: 74.4)Was an Adverse Event Identified?  No Author's Role		APP (e.g. PA or APRN) Total Time Spent on this Encounter (in minutes)5 minutes

## 2022-12-22 ENCOUNTER — Telehealth: Admit: 2022-12-22 | Payer: PRIVATE HEALTH INSURANCE | Attending: Infectious Diseases | Primary: Family Medicine

## 2022-12-22 NOTE — Telephone Encounter
[facility] Documentation NoteALL OTHER PHONE CALLS (for other adverse events, missing labs, etc)   OPAT Synopsis can be found Here Reason for CallOTHER                                   Other Issue: return call to [facility].  Will change pt's follow up to video visit.  Pt has active mychart.	Antibiotics Implicated (select all that apply) : Not Applicable Affiliated Agency: Other [place]  Author's Role		RN Total Time Spent on this Encounter (in minutes)10 minutes

## 2022-12-22 NOTE — Telephone Encounter
[facility] stating that Latrelle spouse is asking if he can do a video instead of coming into the clinic for his visit on 9/17 please call the facility and let her [phone] ext [phone] Herbert Seta)

## 2022-12-23 ENCOUNTER — Encounter: Admit: 2022-12-23 | Payer: PRIVATE HEALTH INSURANCE | Attending: Family Medicine | Primary: Family Medicine

## 2022-12-23 ENCOUNTER — Telehealth: Admit: 2022-12-23 | Payer: PRIVATE HEALTH INSURANCE | Primary: Family Medicine

## 2022-12-23 NOTE — Telephone Encounter
[facility] Documentation NoteVANCOMYCIN MONITORING                                                            OUTPATIENT PARENTERAL ANTIBIOTIC THERAPY MONITORING: VANCOMYCIN OPAT Synopsis can be found Here  Current Vancomycin Order: 1.25 g IV every 12 hours Anticipated vancomycin discontinuation date: 01/18/23 Renal Function:  StableDate Scr  8/29 0.70 8/23 Not drawn Level:Date Vanc Level Vanc Dose 8/29 12.1 1.25g q12h 8/23 11.3 1.25g q12h Type of Level:  TroughTrough Goal: 10-15Source:  VerbalBased on vancomycin level, would: Continue current regimenRepeat level recommended: Continue current regimenCommunicated with:   Other [place] [address]  [place], RN)Adverse events identified?  No Time Spent on this Encounter: 30 minutes Dario Guardian, PharmDRoll: Pharmacist

## 2022-12-27 DIAGNOSIS — I81 Portal vein thrombosis: Secondary | ICD-10-CM | POA: Diagnosis not present

## 2022-12-27 DIAGNOSIS — N400 Enlarged prostate without lower urinary tract symptoms: Secondary | ICD-10-CM | POA: Diagnosis not present

## 2022-12-27 DIAGNOSIS — R1030 Lower abdominal pain, unspecified: Secondary | ICD-10-CM | POA: Diagnosis not present

## 2022-12-27 DIAGNOSIS — K7460 Unspecified cirrhosis of liver: Secondary | ICD-10-CM | POA: Diagnosis not present

## 2022-12-27 DIAGNOSIS — R188 Other ascites: Secondary | ICD-10-CM | POA: Diagnosis not present

## 2022-12-27 DIAGNOSIS — I868 Varicose veins of other specified sites: Secondary | ICD-10-CM | POA: Diagnosis not present

## 2022-12-27 DIAGNOSIS — R195 Other fecal abnormalities: Secondary | ICD-10-CM | POA: Diagnosis not present

## 2022-12-27 DIAGNOSIS — K625 Hemorrhage of anus and rectum: Secondary | ICD-10-CM | POA: Diagnosis not present

## 2022-12-27 DIAGNOSIS — K55069 Acute infarction of intestine, part and extent unspecified: Secondary | ICD-10-CM | POA: Diagnosis not present

## 2022-12-27 DIAGNOSIS — R001 Bradycardia, unspecified: Secondary | ICD-10-CM | POA: Diagnosis not present

## 2022-12-27 DIAGNOSIS — I10 Essential (primary) hypertension: Secondary | ICD-10-CM | POA: Diagnosis not present

## 2022-12-27 DIAGNOSIS — D696 Thrombocytopenia, unspecified: Secondary | ICD-10-CM | POA: Diagnosis not present

## 2022-12-27 DIAGNOSIS — N200 Calculus of kidney: Secondary | ICD-10-CM | POA: Diagnosis not present

## 2022-12-27 DIAGNOSIS — K7469 Other cirrhosis of liver: Secondary | ICD-10-CM | POA: Diagnosis not present

## 2022-12-29 ENCOUNTER — Encounter: Admit: 2022-12-29 | Payer: PRIVATE HEALTH INSURANCE | Attending: Family Medicine | Primary: Family Medicine

## 2022-12-29 NOTE — Other
[facility] Documentation NoteLAB REVIEW                                                                                      OPAT Synopsis can be found Here Summary of ResultsDate of lab draw: 8/30/24Cr= 0.70 (from: 0.74)WBC= 5.0  (from: 6.3)Hg/HCT= 15.6/47.3  (from: 16.5/50.5)CRP= 19.9  (from: 24.9)Was an Adverse Event Identified?  No Author's Role		APP (e.g. PA or APRN) Total Time Spent on this Encounter (in minutes)5 minutes

## 2022-12-30 ENCOUNTER — Encounter: Admit: 2022-12-30 | Payer: PRIVATE HEALTH INSURANCE | Attending: Infectious Disease | Primary: Family Medicine

## 2022-12-30 ENCOUNTER — Telehealth
Admit: 2022-12-30 | Payer: PRIVATE HEALTH INSURANCE | Attending: Pharmacist Clinician (PhC)/ Clinical Pharmacy Specialist | Primary: Family Medicine

## 2022-12-30 LAB — VANCOMYCIN, TROUGH: VANCOMYCIN TROUGH: 13.2 mg/L (ref 10.0–20.0)

## 2022-12-30 LAB — C-REACTIVE PROTEIN     (CRP): C-REACTIVE PROTEIN: 18.4 mg/L — ABNORMAL HIGH (ref <8.0)

## 2022-12-30 NOTE — Progress Notes
[facility] Documentation NoteVANCOMYCIN MONITORING                                                            OUTPATIENT PARENTERAL ANTIBIOTIC THERAPY MONITORING: VANCOMYCIN OPAT Synopsis can be found Here  Current Vancomycin Order: 1.25 g IV every 12 hours Anticipated vancomycin discontinuation date: 01/18/23 Renal Function:  StableDate Scr  9/5 0.69 8/29 0.70 8/23 Not drawn Level:Date Vanc Level Vanc Dose 9/5 13.2 1.25g q12h 8/29 12.1 1.25g q12h 8/23 11.3 1.25g q12h Type of Level:  TroughTrough Goal: 10-15Source:  VerbalBased on vancomycin level, would: Continue current regimenRepeat level recommended: 9/12Communicated with:   Other [employer]  Adverse events identified?  No Time Spent on this Encounter: 30 minutes Mathis Fare, PharmD, BCIDPInfectious Disease Pharmacist

## 2023-01-02 DIAGNOSIS — K921 Melena: Secondary | ICD-10-CM | POA: Diagnosis not present

## 2023-01-02 DIAGNOSIS — I5032 Chronic diastolic (congestive) heart failure: Secondary | ICD-10-CM | POA: Diagnosis not present

## 2023-01-02 DIAGNOSIS — Z6829 Body mass index (BMI) 29.0-29.9, adult: Secondary | ICD-10-CM | POA: Diagnosis not present

## 2023-01-02 NOTE — Other
[facility] OP[facility] Documentation NoteLAB REVIEW                                                                                      OPAT Synopsis can be found Here Summary of ResultsDate of lab draw: 12/29/22 from 8/29/24Cr= 0.69 (from: 0.7)WBC= 6.1  (from: 5.0)Hg/HCT= 14.7/45  (from: 15.6/47.3)CRP= 18.4  (from: 19.9)LFT normalWas an Adverse Event Identified?  No Author's Role		APP (e.g. PA or APRN) Total Time Spent on this Encounter (in minutes)5 minutes

## 2023-01-05 ENCOUNTER — Telehealth: Admit: 2023-01-05 | Payer: PRIVATE HEALTH INSURANCE | Primary: Family Medicine

## 2023-01-05 NOTE — Progress Notes
 OUTPATIENT PARENTERAL ANTIBIOTIC THERAPY MONITORING: VANCOMYCIN OPAT Synopsis can be found Here  Current Vancomycin Order: 1.25 g IV every 12 hours Anticipated vancomycin discontinuation date: 01/18/23 Renal Function:  StableDate Scr  9/12 0.7 9/5 0.69 8/29 0.70 8/23 Not drawn Level:Date Vanc Level Vanc Dose 9/12 13.3 1.25g q12h 9/5 13.2 1.25g q12h 8/29 12.1 1.25g q12h 8/23 11.3 1.25g q12h  Type of Level:  TroughTrough Goal: 10-15Source:  VerbalBased on vancomycin level, would: Continue current regimenRepeat level recommended: 9/18/2024Communicated with:   Other [place]verse events identified?  No Time Spent on this Encounter: 30 minutes Indigo Moss, PharmDRoll: Pharmacist3:39 PM

## 2023-01-06 ENCOUNTER — Encounter: Admit: 2023-01-06 | Payer: PRIVATE HEALTH INSURANCE | Attending: Family Medicine | Primary: Family Medicine

## 2023-01-08 NOTE — Progress Notes (Unsigned)
GI Office Note    Referring Provider: Toma Deiters, MD Primary Care Physician:  Toma Deiters, MD  Primary Gastroenterologist:  Chief Complaint   No chief complaint on file.   History of Present Illness   Mike Moreno is a 77 y.o. male presenting today for follow up. Last seen 05/2022. He has h/o cirrhosis.   \\\     Labs at [facility] 12/27/22:  CT A/P with contrast 12/27/22: 1. 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 Small amount of abdominal and pelvic ascites.  2. Focal filling defect in the origin of the superior mesenteric  vein with flow on both sides suggesting nonocclusive thrombus.  3. No evidence of bowel obstruction or inflammation.  4. Aortic atherosclerosis.  5. Prostate gland is enlarged.  6. Nonobstructing stone in the right kidney.     CT A/P without contrast 01/2020 done after a fall, showing cirrhosis. Subsequent CT in 03/2021 with multiple hypodensities in the periphery of the liver, at one time thought worrisome for primary or metastatic neoplasm but now thought to be areas of asymmetry and fatty replacement or areas of hepatitis. Last CT as outlined below.    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 Concern for possible HCC but MRI not an option due to elevated creatinine. Patient elected to go home with comfort measures and no further work up of liver lesions.  CT Abd without contrast 01/2022: 1. 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 Single subcentimeter low-density lesion in the right  hepatic lobe remains too small to characterize. No definite new  liver lesions identified on noncontrast imaging.  2. Question mild wall thickening of the gastric antrum and proximal  duodenum, which may be secondary to underdistention or  gastritis/duodenitis.  3. Small left pleural effusion with adjacent atelectasis. Volume of   effusion has slightly decreased from prior.  4. Nonobstructing right renal stone.  5. Aortic atherosclerosis (ICD10-I70.0).   His brother had cirrhosis felt to be due to etoh use. Patient states he used to drink regularly but quit in 1983. Remote drug use. Patient still works.    No prior EGD. He had thrombocytopenia (82956) in 03/2021. He is on propranolol 10mg  BID but he is unsure purpose. He has never had EGD. Also on midodrine, furosemide, and spironolactone.    Colonoscopy 11/2016: -one 4mm polyp in rectum, benign -nodular ileal mucosa, benign -surveillance colonoscopy in 5 years  Colonoscopy 05/2022: -four 4-32mm polyps in sigmoid colon, transverse colon and cecum removed.  -multiple tubular adenomas -consider another colonoscopy in five years if overall health permits.  Medications   Current Outpatient Medications  Medication Sig Dispense Refill   alfuzosin (UROXATRAL) 10 MG 24 hr tablet Take 10 mg by mouth daily.     Ascorbic Acid (VITAMIN C) 1000 MG tablet Take 1,000 mg by mouth daily.     furosemide (LASIX) 40 MG tablet Take 40 mg by mouth daily.     gabapentin (NEURONTIN) 100 MG capsule Take 100 mg by mouth at bedtime.     LINZESS 290 MCG CAPS capsule Take 290 mcg by mouth daily.     midodrine (PROAMATINE) 5 MG tablet Take 5 mg by mouth 3 (three) times daily.     nitroGLYCERIN (NITROSTAT) 0.4 MG SL tablet Place 0.4 mg under the tongue as needed for chest pain.     propranolol (INDERAL) 10 MG tablet Take 10 mg  by mouth 2 (two) times daily.     sodium bicarbonate 650 MG tablet Take 650 mg by mouth 2 (two) times daily.     spironolactone (ALDACTONE) 25 MG tablet Take 25 mg by mouth daily.     No current facility-administered medications for this visit.    Allergies   Allergies as of 01/09/2023   (No Known Allergies)     Past Medical History   Past Medical History:  Diagnosis Date   BPH (benign prostatic hyperplasia)    Cirrhosis (HCC)    HTN (hypertension)     Hyperlipidemia    Neuropathy     Past Surgical History   Past Surgical History:  Procedure Laterality Date   bilateral inguinal hernias     BIOPSY  12/07/2016   Procedure: BIOPSY;  Surgeon: Corbin Ade, MD;  [facility];  Service: Endoscopy;;  ileocecal valve   COLONOSCOPY  08/2011   According to [facility] notes, 2 tubular adenomatous colon polyps removed next surveillance colonoscopy in 5 years   COLONOSCOPY N/A 12/07/2016   Procedure: COLONOSCOPY;  Surgeon: Corbin Ade, MD;  [facility]: [facility];  Service: Endoscopy;  Laterality: N/A;  1030    COLONOSCOPY WITH PROPOFOL N/A 06/22/2022   Procedure: COLONOSCOPY WITH PROPOFOL;  Surgeon: Corbin Ade, MD;  [facility];  Service: Endoscopy;  Laterality: N/A;  1:45 pm, pt can't move transportation   HAND SURGERY     traumatic injury   POLYPECTOMY  12/07/2016   Procedure: POLYPECTOMY;  Surgeon: Corbin Ade, MD;  [facility];  Service: Endoscopy;;  colon   POLYPECTOMY  06/22/2022   Procedure: POLYPECTOMY;  Surgeon: Corbin Ade, MD;  [facility];  Service: Endoscopy;;   UMBILICAL HERNIA REPAIR      Past Family History   Family History  Problem Relation Age of Onset   Cirrhosis Brother    Colon cancer Neg Hx     Past Social History   Social History   Socioeconomic History   Marital status: Single    Spouse name: Not on file   Number of children: Not on file   Years of education: Not on file   Highest education level: Not on file  Occupational History   Not on file  Tobacco Use   Smoking status: Former    Types: Cigarettes, Cigars   Smokeless tobacco: Never   Tobacco comments:    quit 2015  Vaping Use   Vaping status: Never Used  Substance and Sexual Activity   Alcohol use: No    Comment: some etoh 20 years ago   Drug use: No    Comment: not since 2005   Sexual activity: Not Currently  Other Topics Concern   Not on file  Social History Narrative   Not  on file   Social Determinants of Health   Financial Resource Strain: Low Risk  (04/17/2021)   [facility], [facility]   Overall Financial Resource Strain (CARDIA)    Difficulty of Paying Living Expenses: Not hard at all  Food Insecurity: Not on file  Transportation Needs: No Transportation Needs (04/21/2021)   Received from [facility], [facility]   PRAPARE - Transportation    Lack of Transportation (Medical): No    Lack of Transportation (Non-Medical): No  Physical Activity: Not on file  Stress: No Stress Concern Present (04/17/2021)   Received from [facility], [facility]   [facility] of Occupational Health -  Occupational Stress Questionnaire    Feeling of Stress : Not at all  Social Connections: Unknown (08/27/2021)   Received from [facility], [facility]   Social Network    Social Network: Not on file  Intimate Partner Violence: Unknown (07/30/2021)   Received from [facility], [facility]   HITS    Physically Hurt: Not on file    Insult or Talk Down To: Not on file    Threaten Physical Harm: Not on file    Scream or Curse: Not on file    Review of Systems   General: Negative for anorexia, weight loss, fever, chills, fatigue, weakness. ENT: Negative for hoarseness, difficulty swallowing , nasal congestion. CV: Negative for chest pain, angina, palpitations, dyspnea on exertion, peripheral edema.  Respiratory: Negative for dyspnea at rest, dyspnea on exertion, cough, sputum, wheezing.  GI: See history of present illness. GU:  Negative for dysuria, hematuria, urinary incontinence, urinary frequency, nocturnal urination.  Endo: Negative for unusual weight change.     Physical Exam   There were no vitals taken for this visit.   General: Well-nourished, well-developed in no acute distress.  Eyes: No icterus. Mouth: Oropharyngeal mucosa moist and pink , no lesions erythema or exudate. Lungs: Clear to auscultation bilaterally.   Heart: Regular rate and rhythm, no murmurs rubs or gallops.  Abdomen: Bowel sounds are normal, nontender, nondistended, no hepatosplenomegaly or masses,  no abdominal bruits or hernia , no rebound or guarding.  Rectal: ***  Extremities: No lower extremity edema. No clubbing or deformities. Neuro: Alert and oriented x 4   Skin: Warm and dry, no jaundice.   Psych: Alert and cooperative, normal mood and affect.  Labs   *** Imaging Studies   No results found.  Assessment       PLAN   ***   Leanna Battles. Melvyn Neth, MHS, PA-C [facility]

## 2023-01-09 ENCOUNTER — Ambulatory Visit (INDEPENDENT_AMBULATORY_CARE_PROVIDER_SITE_OTHER): Payer: Medicaid - Dental | Admitting: Gastroenterology

## 2023-01-09 ENCOUNTER — Encounter: Payer: Self-pay | Admitting: Gastroenterology

## 2023-01-09 VITALS — BP 120/66 | HR 61 | Temp 97.4°F | Ht 71.0 in | Wt 206.4 lb

## 2023-01-09 DIAGNOSIS — R110 Nausea: Secondary | ICD-10-CM | POA: Diagnosis not present

## 2023-01-09 DIAGNOSIS — R630 Anorexia: Secondary | ICD-10-CM | POA: Diagnosis not present

## 2023-01-09 DIAGNOSIS — K625 Hemorrhage of anus and rectum: Secondary | ICD-10-CM | POA: Diagnosis not present

## 2023-01-09 DIAGNOSIS — K7460 Unspecified cirrhosis of liver: Secondary | ICD-10-CM | POA: Diagnosis not present

## 2023-01-09 DIAGNOSIS — R197 Diarrhea, unspecified: Secondary | ICD-10-CM | POA: Insufficient documentation

## 2023-01-09 DIAGNOSIS — R9389 Abnormal findings on diagnostic imaging of other specified body structures: Secondary | ICD-10-CM | POA: Diagnosis not present

## 2023-01-09 DIAGNOSIS — A09 Infectious gastroenteritis and colitis, unspecified: Secondary | ICD-10-CM | POA: Diagnosis not present

## 2023-01-09 NOTE — Patient Instructions (Signed)
Please complete labs and stool tests. Continue your current medications. Consider skipping lactulose on days you have more than 3 stools.  I will discuss your case with Dr. Jena Gauss. Further recommendations to follow regarding possible upper endoscopy and/or ultrasound.

## 2023-01-09 NOTE — Other
 [facility] Documentation NoteLAB REVIEW                                                                                      OPAT Synopsis can be found Here Summary of ResultsDate of lab draw: 9/12/24Cr=0.7  (from: 0.69)WBC= 5.7  (from: 6.1)Hg/HCT= 14.8/45.0  (from: 14.7/45.0)[place] normalCRP=  10.2 (from: 18.4)Was an Adverse Event Identified?  No Author's Role		APP (e.g. PA or APRN) Total Time Spent on this Encounter (in minutes)5 minutes

## 2023-01-10 ENCOUNTER — Ambulatory Visit: Admit: 2023-01-10 | Payer: PRIVATE HEALTH INSURANCE | Attending: Infectious Disease | Primary: Family Medicine

## 2023-01-10 DIAGNOSIS — G062 Extradural and subdural abscess, unspecified: Secondary | ICD-10-CM

## 2023-01-10 LAB — TSH+FREE T4
Free T4: 1.42 ng/dL (ref 0.82–1.77)
TSH: 3.6 u[IU]/mL (ref 0.450–4.500)

## 2023-01-10 LAB — CBC WITH DIFFERENTIAL/PLATELET
Basophils Absolute: 0 10*3/uL (ref 0.0–0.2)
Basos: 0 %
EOS (ABSOLUTE): 0.1 10*3/uL (ref 0.0–0.4)
Eos: 1 %
Hematocrit: 32.9 % — ABNORMAL LOW (ref 37.5–51.0)
Hemoglobin: 11.7 g/dL — ABNORMAL LOW (ref 13.0–17.7)
Immature Grans (Abs): 0.2 10*3/uL — ABNORMAL HIGH (ref 0.0–0.1)
Immature Granulocytes: 1 %
Lymphocytes Absolute: 1.2 10*3/uL (ref 0.7–3.1)
Lymphs: 10 %
MCH: 37.6 pg — ABNORMAL HIGH (ref 26.6–33.0)
MCHC: 35.6 g/dL (ref 31.5–35.7)
MCV: 106 fL — ABNORMAL HIGH (ref 79–97)
Monocytes Absolute: 0.9 10*3/uL (ref 0.1–0.9)
Monocytes: 8 %
Neutrophils Absolute: 8.9 10*3/uL — ABNORMAL HIGH (ref 1.4–7.0)
Neutrophils: 80 %
Platelets: 47 10*3/uL — CL (ref 150–450)
RBC: 3.11 x10E6/uL — ABNORMAL LOW (ref 4.14–5.80)
RDW: 11.9 % (ref 11.6–15.4)
WBC: 11.2 10*3/uL — ABNORMAL HIGH (ref 3.4–10.8)

## 2023-01-10 LAB — COMPREHENSIVE METABOLIC PANEL
ALT: 42 IU/L (ref 0–44)
AST: 59 IU/L — ABNORMAL HIGH (ref 0–40)
Albumin: 2.9 g/dL — ABNORMAL LOW (ref 3.8–4.8)
Alkaline Phosphatase: 95 IU/L (ref 44–121)
BUN/Creatinine Ratio: 23 (ref 10–24)
BUN: 75 mg/dL — ABNORMAL HIGH (ref 8–27)
Bilirubin Total: 4.1 mg/dL — ABNORMAL HIGH (ref 0.0–1.2)
CO2: 16 mmol/L — ABNORMAL LOW (ref 20–29)
Calcium: 8.5 mg/dL — ABNORMAL LOW (ref 8.6–10.2)
Chloride: 104 mmol/L (ref 96–106)
Creatinine, Ser: 3.25 mg/dL — ABNORMAL HIGH (ref 0.76–1.27)
Globulin, Total: 2.9 g/dL (ref 1.5–4.5)
Glucose: 91 mg/dL (ref 70–99)
Potassium: 3.7 mmol/L (ref 3.5–5.2)
Sodium: 137 mmol/L (ref 134–144)
Total Protein: 5.8 g/dL — ABNORMAL LOW (ref 6.0–8.5)
eGFR: 19 mL/min/{1.73_m2} — ABNORMAL LOW (ref >59)

## 2023-01-10 LAB — B12 AND FOLATE PANEL
Folate: 10 ng/mL (ref >3.0)
Vitamin B-12: 1855 pg/mL — ABNORMAL HIGH (ref 232–1245)

## 2023-01-10 LAB — PROTIME-INR
INR: 1.4 — ABNORMAL HIGH (ref 0.9–1.2)
Prothrombin Time: 15.1 s — ABNORMAL HIGH (ref 9.1–12.0)

## 2023-01-10 LAB — AFP TUMOR MARKER: AFP, Serum, Tumor Marker: 5.6 ng/mL (ref 0.0–8.4)

## 2023-01-10 NOTE — Progress Notes
 [facility]: General Infectious Diseases [address] [place], [place] [address](H) [phone] Medical Director: Heide Spark, MDDirector: Gerome Sam, MD, AAHIVSThilinie D. Howells, MBBS Vallery Ridge, MD, PhD Shawnee Knapp, MD Tedd Sias, MD Jerel Shepherd, MD Mickel Fuchs, [place] Faylene Million, MD Sherian Rein, MD Jimmye Norman, [place] Albertina Parr, MD Loretha Stapler, MD, PhD Chancy Milroy, [place] Nani Skillern, MD, BS, AAHIVS Lianne Moris, MD, PhD  VIDEO TELEHEALTH VISIT: This clinician is part of the telehealth program and is conducting this visit in a currently approved location. For this visit the clinician and patient were present via interactive audio & video telecommunications system that permits real-time communications, via the [employer].Patient's use of the telehealth platform followed consent and acknowledges agreement to permit telehealth for this visit. State patient is located in: [place] clinician is appropriately licensed in the above state to provide care for this visit. Other individuals present during the telehealth encounter and their role/relation: spouse and nurse RachelBecause this visit was completed over video, a hands-on physical exam was not performed. Patient/parent or guardian understands and knows to call back if condition changes.37 minutesInfectious Disease Outpatient Follow Up VisitPatient Name:  Carlos Caldwell of Birth:  [birth year]	EPIC MRN:  [id number] Date of Service: 9/17/2024History obtained from:  chart review and the patientHistory of Present IllnessWilliam Kaczynski  is a 77 y.o. male  with past medical history significant for b/l hip replacements, kidney stone, hyperlipidemia, remote h/o basal cell carcinoma, peripheral neuropathy, who presented on 08/14 with lower back pain for 2 weeks. Found to have L5-S1 septic facet joint arthritis, L5-S1 diskitis, L4 to S2 prominent circumferential epidural phlegmon and small septated abscesses. During the hospitalization he was evaluated by IR for  possible aspiration fo phlegmon or abscess areas; but collections were too small to tap. TTE without evidence of vegetations. IR re-evaluated and were unable to find area to biopsy vertebral body. Therefore, planned to empirically treat with 6 weeks of vancomycin/CTX and metronidazole for epidural abscess/ OM with re-imaging closer to end of treatment course to determine progression/ resuolution.Today's visitPresents today for follow-up via video with his wife and nurse Fleet Contras. Doing better, however, notes difficulty with walking and standing. Pain not present when not moving. 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  Previously followed with neurologist for this, but he passed and not currently seeing anyone. Despite this he was fully functional prior to this infection.Prescribed 325 mg Tylenol 4 times daily, but does not take it that often. Taking 5 mg oxycodone once daily during physical therapy at night. H/o bilateral hip replacement.Review of SystemsReview of Systems Genitourinary:       Denies urine retention, incontinence. Musculoskeletal:  Positive for back pain.      B/L lower back pain  that radiates from thigh to toes. But the radiation is resolved now.  Neurological:       Denies loss of perineal sensation. Past Medical History: Diagnosis Date  BCC (basal cell carcinoma), ear, right   Patent foramen ovale   Peripheral neuropathy  Past Surgical History: Procedure Laterality Date  COLONOSCOPY  07/2014  JOINT REPLACEMENT    b/l hips  ORTHOPEDIC SURGERY    ROTATOR CUFF REPAIR Bilateral   TOTAL HIP ARTHROPLASTY  2000  TOTAL HIP ARTHROPLASTY  2003  VASCULAR SURGERY    L carotid  endarterectomy family history includes Heart failure in his father. reports that he has never smoked. He has never used smokeless tobacco. He reports that he does not drink alcohol and does not use drugs.ALLERGIESNo Known Allergies	Physical ExamThere were no vitals filed for this visit.Not performed due to the nature of video visits.LABORATORY DATALab Results Component Value Date  WBC 6.1 12/29/2022  HGB 14.7 12/29/2022  HCT 45.0 12/29/2022  MCV 94.1 12/29/2022  PLT 274 12/29/2022 Lab Results Component Value Date  CREATININE 0.69 (L) 12/29/2022  BUN 15 12/29/2022  NA 138 12/29/2022  K 4.5 12/29/2022  CL 103 12/29/2022  CO2 28 12/29/2022 CrCl cannot be calculated (Unknown ideal weight.).Lab Results Component Value Date  ALT 19 12/29/2022  AST 15 12/29/2022  ALKPHOS 61 12/29/2022  BILITOT 0.4 12/29/2022 9/5/2024Cr= 0.69 (from: 0.7)WBC= 6.1  (from: 5.0)Hg/HCT= 14.7/45  (from: 15.6/47.3)CRP= 18.4  (from: 19.9)LFT normal9/12/2024Cr=0.7  (from: 0.69)WBC= 5.7  (from: 6.1)Hg/HCT= 14.8/45.0  (from: 14.7/45.0)[place] normalCRP=  10.2 (from: 18.4)Microbiology8/15/2024 Blood Culture: NG8/15/2024 MRSA: Not detectedRadiology8/06/2022 XR Lumbar Spine AP and LateralFINDINGS:Five non-rib-bearing, lumbar-type vertebra are visualized.There is mild retrolisthesis of L1 relative to L2, retrolisthesis of L3 relative to L4, anterolisthesis of L4 on L5, and anterolisthesis of L5 relative to S1. This is likely on degenerative basis.Vertebral body heights are maintained. No evidence of fracture.There are multilevel degenerative changes, as evidenced by endplate sclerosis, disc space narrowing, facet arthropathy, and marginal osteophyte formation.The sacroiliac joints are unremarkable. Bilateral total hip arthroplasty.Calcification of the abdominal aorta. IMPRESSION:Multilevel degenerative changes of the lumbar spine.  Report initiated by:  Alvy Beal, MD Reported and signed by: Lavera Guise, MD8/14/2024 [place] Lumbar Spine w/o IV ContrastFINDINGS: There is no acute fracture or subluxation of the lumbar spine. The vertebral body heights are grossly maintained. There are multilevel degenerative changes including intervertebral disc space narrowing with vacuum disc phenomenon, endplate sclerosis and osteophytosis, and facet arthropathy, as well as partial fusion of the T12-L1 vertebral bodies. Again seen is mild retrolisthesis of L1 on L2, L2 on L3, and L3 on L4 and mild grade 1 anterolisthesis of L4 on L5, likely degenerative in nature.  There is severe left-sided neural foraminal stenosis at L5-S1. Moderate neural foraminal stenosis is seen on the right. Several right-sided nonobstructive renal calculi are appreciated, with the largest calculus measuring up to 1.6 cm in the right upper pole. A right upper pole renal cyst is present. Evaluation of the pelvic organs is limited secondary to streak artifact from bilateral hip arthroplasties.  Note is made of extensive atherosclerotic calcifications in the abdominal aorta and common iliac vessels. 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 No acute fracture or subluxation. Report initiated by:  Catha Brow, MD Reported and signed by: Bradly Bienenstock, MD 12/07/2022 Chest XR PA and LateralFINDINGS: Support Devices: None  Heart/Mediastinum: Normal. Lungs/Pleura: No focal consolidation. No effusion or pneumothorax. Bones: No acute findings. IMPRESSION:No acute findings. Reported and signed by: Bradly Bienenstock, MD 12/08/2022 MRI Lumbar Spine w/ w/o IV ContrastFINDINGS: Vertebral numbering performed, assuming last fully formed disc as L5-S1. By this numbering system, S1 appears to be partially lumbarized. Please make careful attention to this vertebral numbering if there is surgical intervention. Grade 1 anterolisthesis of L4 on L5 and grade 1 retrolisthesis of L2 on L3 and L1 on L2, presumably degenerative. Lumbar vertebrae maintain normal height with no acute compression fracture. Chronic L4 anterosuperior limbus vertebra. There is bilateral L5-S1 facet joint effusions with abnormal postcontrast enhancement in the adjacent paraspinal  soft tissues, compatible with septic facet joint arthritis. There is associated enhancing epidural phlegmon with septated epidural abscesses with central areas of nonenhancement involving the epidural space circumferentially spanning from L4-5 through S1-S2 levels. Largest abscess in the posterior epidural space at S1 level measures approximately 3.8 x 1.2 x 0.7 cm (image 10 of series 11), also demonstrating restricted diffusion. Epidural phlegmon and abscess is result in severe spinal canal stenosis in the lower lumbar spine from L4 through S1-S2. There is also mild edema and enhancement about the L5-S1 endplates and mild hyperintense signal in the L5-S1 disc, which is also concerning for discitis/ostial myelitis. Trace prevertebral edema and enhancement is seen anterior to L5 vertebral body. No prevertebral fluid collection or abscess is seen. Chronic partial fatty atrophy in the right psoas muscle. There is also atrophy of the right iliacus, partially imaged. No abnormal enhancement, collection or abscesses in the psoas muscles.Multilevel degenerative endplate changes and a Schmorl's node at superior L3 endplate. No additional foci of osteomyelitis in the remainder of the lumbar spine. The conus terminates at L1-2 level, normal in morphology and signal. T12-L1: Partial degenerative vertebral fusion. No spinal canal or foraminal stenosis.L1-2: Trace retrolisthesis. Mild disc bulge. Mild posterior epidural lipomatosis. Mild narrowing of the spinal canal and bilateral neural foramina.L2-3: Trace retrolisthesis, with disc uncovering/mild bulge. Mild bilateral facet joint arthrosis. Mild spinal canal and mild bilateral neural foraminal stenosis.L3-4: Mild disc bulge. Mild bilateral facet joint arthrosis. No spinal canal stenosis. Mild bilateral foraminal stenosis.L4-5: Trace anterolisthesis. Mild disc bulge. Bilateral facet joint arthrosis. Degenerative changes with superimpose infectious epidural phlegmon/abscesses resulting severe spinal canal stenosis. Moderate bilateral foraminal stenosis.L5-S1: Mild disc bulge. 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 Infectious/inflammatory enhancement also extends to the bilateral L5-S1 neural foramina Atherosclerotic changes in the abdominal aorta, without aneurysmal dilatation seen.A T2 hyperintense exophytic right renal cyst. A 1.8 cm hypointense nonobstructing right renal calculus is seen.  IMPRESSION: Abnormal signal and enhancement compatible with bilateral L5-S1 septic facet joint arthritis, with mild signal abnormality and enhancement about the L5-S1 endplates also suggestive of discitis. There is prominent circumferential epidural phlegmon and small septated abscesses, in the lower lumbar spine spanning from L4 through S1-S2 levels resulting in severe spinal canal stenosis. Recommend urgent spine surgery consultation. Reported and signed by: Dion Saucier, MD 12/08/2022 Echo* Normal left ventricular cavity size.  Normal left ventricle wall thickness.  No regional wall motion abnormalities.  Normal left ventricular systolic function.  LVEF estimated by visual assessment was between 55-60%.  Normal diastolic function and filling pressures.  No thrombus visualized in the left ventricle.* Normal right ventricular cavity size and systolic function.* Left atrium is mildly dilated.  Right atrium is normal in size.* No significant valvular abnormalities.* No vegetation visualized on any cardiac valve on this average quality study.* All visible segments of the aorta are normal in size.* No significant pericardial effusion.* No prior study available for comparison.12/09/2022 MRI Cervical & Thoracic Spine w/ w/o IV ContrastCERVICAL SPINE: Visualized posterior fossa and craniocervical junction are unremarkable. Loss of the cervical lordosis. Trace multilevel listhesis is presumably degenerative. Vertebral bodies maintain normal height. Multilevel intervertebral disc desiccation and loss of disc height. Multilevel degenerative endplate changes. No aggressive marrow replacing lesion.No abnormal signal or postcontrast enhancement is seen to suggest discitis/osteomyelitis.Prevertebral and paraspinal soft tissues are unremarkable. The cervical spinal cord is normal in caliber and signal intensity.No cord compression. Multilevel posterior disc osteophyte complexes resulting in  multilevel mild spinal canal stenosis, without cord compression or cord signal abnormality. Varying degrees of multilevel foraminal stenosis, suboptimally assessed due to motion artifact. Visualized extraspinal soft tissues are unremarkable. THORACIC SPINE: Normal thoracic kyphosis.Trace anterolisthesis of T11 on T12 and T10 on T11. There is also trace anterolisthesis of T1 on T2 and T2 on T3. These are presumably degenerative.Remainder of the thoracic vertebrae maintain normal alignment.Multilevel intervertebral disc desiccation and disc height narrowing. Multilevel degenerative endplate changes.Presumably degenerative partial intervertebral fusion at T12-L1 level.No abnormal signal or postcontrast enhancement is seen to suggest discitis/osteomyelitis.Prevertebral and paraspinal soft tissues are unremarkable. At T10-11 level, trace anterolisthesis with circumferential disc bulge and ligamentum flavum thickening/ligament of flavum calcifications and posterior epidural lipomatosis, result in severe spinal canal stenosis with complete effacement of intrathecal CSF compressing upon the spinal cord. No definite cord signal abnormality seen. At T9-10 level, circumferential disc bulge, facet joint arthrosis and ligamentum flavum thickening result in moderate spinal canal stenosis. Mild bilateral foraminal stenosis. At T11-12, there is trace anterolisthesis with mild disc bulge. Bilateral facet joint arthrosis and ligamentum flavum thickening. This results in mild-to-moderate spinal canal stenosis. Moderate right foraminal stenosis. No significant left foraminal stenosis. Mild disc bulges and small disc protrusions at other levels in the remainder of the thoracic spine, without significant spinal canal or foraminal stenosis seen. Mild dural enhancement/epidural venous congestion in the lower thoracic spine spanning T9-T11. There is also mild posterior dural enhancement seen at L1/L2 levels. Partially imaged right renal cyst. Partially imaged nonobstructing right renal calculus. Remainder of the visualized extraspinal structures are unremarkable. IMPRESSION:   No evidence of discitis/osteomyelitis in the cervical or thoracic spine. Multilevel degenerative changes in the cervical spine, without significant spinal canal stenosis. Multilevel degenerative changes in the thoracic spine resulting in severe spinal canal stenosis at T10-11 level, resulting in mild cord compression. No definite cord signal abnormality, within limitations of motion artifact. Moderate spinal canal stenosis at T9-10 and T11-12 levels. Nonspecific mild dural/epidural enhancement in the lower thoracic spine at T9-T11 levels, probably from epidural venous congestion, could be reactive to infection/epidural abscess in the lower lumbar spine. No epidural collection or abscess is seen. Reported and signed by: Dion Saucier, MD AssessmentPatient is a 77 y.o. male with past medical history significant for b/l hip replacements, kidney stone, hyperlipidemia, remote h/o basal cell carcinoma, peripheral neuropathy, who presented on 08/14 with lower back pain for 2 weeks. Found to have L5-S1 septic facet joint arthritis, L5-S1 diskitis, L4 to S2 prominent circumferential epidural phlegmon and small septated abscesses. TTE no vegetations, BC NG. IR Attempted/aspiration, bone biopsy, but was unable to do so.Syndrome: Lumbar spine epidural abscess/OM. Planned for empiric treatment with 6  weeks IV vancomycin, ceftriaxone and flagyl and repeat MRI.RecommendationsContinue current antibiotics until repeat MRI on 9/30.8/14 MRI results discussed - severe spinal stenosis. Urgent referral placed for spine center for evaluation. Advised patient not to travel internationally until evaluated by spine specialist.Discussed that typically for spine infections length of treatment ranges from 6-12 weeks depending on findings. CRP is improving (18/10.2). This is encouraging. Discussed that if MRI is not optimal, we may need to extend antibiotics.Advised to follow-up with PCP regarding pain management. Requesting diazepam prior to MRI due to anxiety/claustrophobia. He received 5mg  prior to last MRI. Ok to do one dose prior to MRI. Updated OPAT team. Plan discussed with patient. All questions answered. Follow up visit with me on 10/1 to discuss results. Scribed for Samara Snide, MBBS by Angelene Giovanni, medical scribe. September 17, 2024The  documentation recorded by the scribe accurately reflects the services I personally performed and the decisions made by me. I reviewed and confirmed all material entered and/or pre-charted by the scribe.Muneeb Veras, M.B.B.S.Assistant Professor of Medicine (Infectious Diseases)Section of Infectious DiseasesDepartment of Internal MedicineYale UAL Corporation of Medicine

## 2023-01-11 ENCOUNTER — Other Ambulatory Visit: Payer: Self-pay

## 2023-01-11 DIAGNOSIS — M4800 Spinal stenosis, site unspecified: Secondary | ICD-10-CM

## 2023-01-11 DIAGNOSIS — M545 Low back pain, unspecified: Secondary | ICD-10-CM

## 2023-01-11 DIAGNOSIS — K7460 Unspecified cirrhosis of liver: Secondary | ICD-10-CM

## 2023-01-12 ENCOUNTER — Other Ambulatory Visit (HOSPITAL_COMMUNITY)
Admission: RE | Admit: 2023-01-12 | Discharge: 2023-01-12 | Disposition: A | Discharge status: Home / Self Care | Payer: Medicare HMO | Source: Ambulatory Visit | Attending: Gastroenterology | Admitting: Gastroenterology

## 2023-01-12 ENCOUNTER — Telehealth: Admit: 2023-01-12 | Payer: PRIVATE HEALTH INSURANCE | Primary: Family Medicine

## 2023-01-12 DIAGNOSIS — K7460 Unspecified cirrhosis of liver: Secondary | ICD-10-CM | POA: Insufficient documentation

## 2023-01-12 LAB — CBC WITH DIFFERENTIAL/PLATELET
Abs Immature Granulocytes: 0.15 10*3/uL — ABNORMAL HIGH (ref 0.00–0.07)
Basophils Absolute: 0 10*3/uL (ref 0.0–0.1)
Basophils Relative: 0 %
Eosinophils Absolute: 0.3 10*3/uL (ref 0.0–0.5)
Eosinophils Relative: 5 %
HCT: 30.6 % — ABNORMAL LOW (ref 39.0–52.0)
Hemoglobin: 10.6 g/dL — ABNORMAL LOW (ref 13.0–17.0)
Immature Granulocytes: 3 %
Lymphocytes Relative: 20 %
Lymphs Abs: 1 10*3/uL (ref 0.7–4.0)
MCH: 37.2 pg — ABNORMAL HIGH (ref 26.0–34.0)
MCHC: 34.6 g/dL (ref 30.0–36.0)
MCV: 107.4 fL — ABNORMAL HIGH (ref 80.0–100.0)
Monocytes Absolute: 0.8 10*3/uL (ref 0.1–1.0)
Monocytes Relative: 16 %
Neutro Abs: 2.8 10*3/uL (ref 1.7–7.7)
Neutrophils Relative %: 56 %
Platelets: 62 10*3/uL — ABNORMAL LOW (ref 150–400)
RBC: 2.85 MIL/uL — ABNORMAL LOW (ref 4.22–5.81)
RDW: 14.1 % (ref 11.5–15.5)
WBC: 5 10*3/uL (ref 4.0–10.5)
nRBC: 0 % (ref 0.0–0.2)

## 2023-01-12 LAB — COMPREHENSIVE METABOLIC PANEL
ALT: 64 U/L — ABNORMAL HIGH (ref 0–44)
AST: 101 U/L — ABNORMAL HIGH (ref 15–41)
Albumin: 2.4 g/dL — ABNORMAL LOW (ref 3.5–5.0)
Alkaline Phosphatase: 98 U/L (ref 38–126)
Anion gap: 8 (ref 5–15)
BUN: 54 mg/dL — ABNORMAL HIGH (ref 8–23)
CO2: 22 mmol/L (ref 22–32)
Calcium: 8.4 mg/dL — ABNORMAL LOW (ref 8.9–10.3)
Chloride: 106 mmol/L (ref 98–111)
Creatinine, Ser: 1.89 mg/dL — ABNORMAL HIGH (ref 0.61–1.24)
GFR, Estimated: 36 mL/min — ABNORMAL LOW (ref >60)
Glucose, Bld: 118 mg/dL — ABNORMAL HIGH (ref 70–99)
Potassium: 3.5 mmol/L (ref 3.5–5.1)
Sodium: 136 mmol/L (ref 135–145)
Total Bilirubin: 2.6 mg/dL — ABNORMAL HIGH (ref 0.3–1.2)
Total Protein: 5.5 g/dL — ABNORMAL LOW (ref 6.5–8.1)

## 2023-01-12 LAB — PROTIME-INR
INR: 1.4 — ABNORMAL HIGH (ref 0.8–1.2)
Prothrombin Time: 17.8 seconds — ABNORMAL HIGH (ref 11.4–15.2)

## 2023-01-12 NOTE — Progress Notes
 OUTPATIENT PARENTERAL ANTIBIOTIC THERAPY MONITORING: VANCOMYCIN OPAT Synopsis can be found Here  Current Vancomycin Order: 1.25 g IV every 12 hours Anticipated vancomycin discontinuation date: 01/23/23  (extended)Renal Function:  StableDate Scr  9/19 0.72 9/12 0.7 9/5 0.69 8/29 0.70 8/23 Not drawn Level:Date Vanc Level Vanc Dose 9/19 14.6 1.25g q12h 9/12 13.3 1.25g q12h 9/5 13.2 1.25g q12h 8/29 12.1 1.25g q12h 8/23 11.3 1.25g q12h  Type of Level:  TroughTrough Goal: 10-15Source:  VerbalBased on vancomycin level, would: Continue current regimenRepeat level recommended: 9/26/2024Communicated with:   Other [place]owsAdverse events identified?  No Time Spent on this Encounter: 30 minutes Ronita Hipps, PharmD, BCPSClinical Pharmacist9/19/2024

## 2023-01-14 LAB — GI PROFILE, STOOL, PCR

## 2023-01-16 ENCOUNTER — Other Ambulatory Visit: Payer: Self-pay | Admitting: *Deleted

## 2023-01-16 ENCOUNTER — Other Ambulatory Visit: Payer: Self-pay | Admitting: Gastroenterology

## 2023-01-16 ENCOUNTER — Other Ambulatory Visit: Payer: Self-pay

## 2023-01-16 DIAGNOSIS — K7460 Unspecified cirrhosis of liver: Secondary | ICD-10-CM

## 2023-01-16 DIAGNOSIS — I8290 Acute embolism and thrombosis of unspecified vein: Secondary | ICD-10-CM

## 2023-01-16 MED ORDER — VANCOMYCIN HCL 125 MG PO CAPS: 125.0000 mg | ORAL_CAPSULE | Freq: Four times a day (QID) | ORAL | 0 refills | Status: AC

## 2023-01-17 ENCOUNTER — Encounter: Admit: 2023-01-17 | Payer: PRIVATE HEALTH INSURANCE | Attending: Infectious Disease | Primary: Family Medicine

## 2023-01-17 NOTE — Other
 [facility] OPAT Documentation NoteLAB REVIEW                                                                                      OPAT Synopsis can be found Here Summary of ResultsDate of lab draw: 9/19/24Cr= 0.72 (from: 0.7)WBC= 5.7 (from: 5.7)Hg/HCT=  14.7/45.2 (from: 14.8/45.0)CRP= 4.2  (from: 10.2)Was an Adverse Event Identified?  No Author's Role		APP (e.g. PA or APRN) Total Time Spent on this Encounter (in minutes)5 minutes

## 2023-01-19 ENCOUNTER — Telehealth: Admit: 2023-01-19 | Payer: PRIVATE HEALTH INSURANCE | Primary: Family Medicine

## 2023-01-19 NOTE — Telephone Encounter
 OUTPATIENT PARENTERAL ANTIBIOTIC THERAPY MONITORING: VANCOMYCIN OPAT Synopsis can be found Here  Current Vancomycin Order: 1.25 g IV every 12 hours Anticipated vancomycin discontinuation date: 01/23/23  (extended)Renal Function:  StableDate Scr  9/26 0.67 9/19 0.72 9/12 0.7 9/5 0.69 8/29 0.70 8/23 Not drawn Level:Date Vanc Level Vanc Dose 9/26 14.0 1.25g q12h 9/19 14.6 1.25g q12h 9/12 13.3 1.25g q12h 9/5 13.2 1.25g q12h 8/29 12.1 1.25g q12h 8/23 11.3 1.25g q12h  Type of Level:  TroughTrough Goal: 10-15Source:  VerbalBased on vancomycin level, would: Continue current regimenRepeat level recommended: n/a; EOT 9/30/24Communicated with:   Other [place]  (Heather)Adverse events identified?  No Time Spent on this Encounter: 30 minutes Dario Guardian, PharmD

## 2023-01-20 ENCOUNTER — Encounter: Admit: 2023-01-20 | Payer: PRIVATE HEALTH INSURANCE | Attending: Infectious Disease | Primary: Family Medicine

## 2023-01-20 NOTE — Other
 [facility] OPAT Documentation NoteLAB REVIEW                                                                                      OPAT Synopsis can be found Here Summary of ResultsDate of lab draw: 0/26/24 from 0.72Cr= 0.67 (from: 0.72)WBC=  5.3 (from: 5.7)Hg/HCT=  15.1/46 (from: 14.7/45.2)CRP= 3.1  (from: 4.2)LFT normalWas an Adverse Event Identified?  No Author's Role		APP (e.g. PA or APRN) Total Time Spent on this Encounter (in minutes)5 minutes

## 2023-01-23 ENCOUNTER — Inpatient Hospital Stay: Admit: 2023-01-23 | Discharge: 2023-01-23 | Payer: PRIVATE HEALTH INSURANCE | Primary: Family Medicine

## 2023-01-23 ENCOUNTER — Telehealth: Admit: 2023-01-23 | Payer: PRIVATE HEALTH INSURANCE | Attending: Emergency Medicine | Primary: Family Medicine

## 2023-01-23 DIAGNOSIS — G062 Extradural and subdural abscess, unspecified: Secondary | ICD-10-CM

## 2023-01-23 MED ORDER — GADOTERATE MEGLUMINE 0.5 MMOL/ML (376.9 MG/ML) INTRAVENOUS SOLUTION
0.5 | Freq: Once | INTRAVENOUS | Status: CP | PRN
Start: 2023-01-23 — End: ?
  Administered 2023-01-23: 16:00:00 0.5 mL via INTRAVENOUS

## 2023-01-23 NOTE — Telephone Encounter
 [facility] OPAT Documentation NoteALL OTHER PHONE CALLS (for other adverse events, missing labs, etc)   OPAT Synopsis can be found Here Reason for CallOTHER                                   Other Issue: MRI results concerning for new abscess formation, pt will be seen via video tomorrow with Dr. Noralee Chars, discussed with her, pt may need IR consult for drainage, will extend pt's antibiotics out a week until plan formulated, vanco 1.25g q12h, ctx 2 g qd and flagyl 500mg  po q8, 	Antibiotics Implicated (select all that apply) : Not Applicable Affiliated Agency: Other [place]  Author's Role		APP (e.g. PA or APRN) Total Time Spent on this Encounter (in minutes)15 minutes

## 2023-01-24 ENCOUNTER — Telehealth: Admit: 2023-01-24 | Payer: PRIVATE HEALTH INSURANCE | Attending: Infectious Disease | Primary: Family Medicine

## 2023-01-24 ENCOUNTER — Ambulatory Visit: Admit: 2023-01-24 | Payer: PRIVATE HEALTH INSURANCE | Attending: Infectious Disease | Primary: Family Medicine

## 2023-01-24 NOTE — Telephone Encounter
 left voice message advising patient to c/b so we can schedule his next video appoinment per dr Ninfa Linden schedule with me in 4 weeks, thanks Video is ok

## 2023-01-25 ENCOUNTER — Encounter: Admit: 2023-01-25 | Payer: Medicare (Managed Care) | Attending: Family Medicine | Primary: Family Medicine

## 2023-01-25 ENCOUNTER — Telehealth: Admit: 2023-01-25 | Payer: PRIVATE HEALTH INSURANCE | Primary: Family Medicine

## 2023-01-25 DIAGNOSIS — M4800 Spinal stenosis, site unspecified: Secondary | ICD-10-CM

## 2023-01-25 DIAGNOSIS — M545 Low back pain, unspecified: Secondary | ICD-10-CM

## 2023-01-25 DIAGNOSIS — R9389 Abnormal findings on diagnostic imaging of other specified body structures: Secondary | ICD-10-CM

## 2023-01-25 DIAGNOSIS — M4640 Discitis, unspecified, site unspecified: Secondary | ICD-10-CM

## 2023-01-25 DIAGNOSIS — G062 Extradural and subdural abscess, unspecified: Secondary | ICD-10-CM

## 2023-01-25 NOTE — Telephone Encounter
 [facility] OPAT Documentation NoteALL OTHER PHONE CALLS (for other adverse events, missing labs, etc)   OPAT Synopsis can be found Here Reason for CallOTHER                                   Other Issue: spoke with Rachael at [facility].  Pt's antibiotics to be extended. Pt wishes to go home.  Ok from ID for pt to be discharged home.  Pt will need referral to [facility] set up by facility prior to discharge.	Antibiotics Implicated (select all that apply) : Not Applicable Affiliated Agency: Other [place]  Author's Role		RN Total Time Spent on this Encounter (in minutes)10 minutes

## 2023-01-26 ENCOUNTER — Telehealth: Admit: 2023-01-26 | Payer: PRIVATE HEALTH INSURANCE | Primary: Family Medicine

## 2023-01-26 ENCOUNTER — Inpatient Hospital Stay: Admit: 2023-01-26 | Payer: PRIVATE HEALTH INSURANCE | Primary: Family Medicine

## 2023-01-26 ENCOUNTER — Telehealth: Admit: 2023-01-26 | Payer: PRIVATE HEALTH INSURANCE | Attending: Family Medicine | Primary: Family Medicine

## 2023-01-26 NOTE — Progress Notes
 OUTPATIENT PARENTERAL ANTIBIOTIC THERAPY MONITORING: VANCOMYCIN OPAT Synopsis can be found Here  Current Vancomycin Order: 1.25 g IV every 12 hours Anticipated vancomycin discontinuation date: 01/23/23 --> 10/7/24Renal Function:  StableDate Scr  10/3 0.69 9/26 0.67 9/19 0.72 9/12 0.7 9/5 0.69 8/29 0.70 8/23 Not drawn Level:Date Vanc Level Vanc Dose 10/3 34.4 1.25g q12h 9/26 14.0 1.25g q12h 9/19 14.6 1.25g q12h 9/12 13.3 1.25g q12h 9/5 13.2 1.25g q12h 8/29 12.1 1.25g q12h 8/23 11.3 1.25g q12h  Type of Level:  Trough - confirmed this level was drawn appropriatelyTrough Goal: 10-15Source:  VerbalBased on vancomycin level, would:  Hold doses 10/3 PM and 10/4 AM and restart 10/4 PM with regimen of 1.25g q24hRepeat level recommended: 10/7 if possible; however plan to transition patient to [facility] (potentially starting 10/4), so [facility] will need to schedule visit once intake is completed. Will also need clarity on if EOT remains 10/7.Communicated with:   Other [place]  - Herbert Seta, RN supervisor Adverse events identified?  No Time Spent on this Encounter: 30 minutes Tasia Catchings, PharmD, [facility] Clinical Pharmacy Specialist

## 2023-01-27 ENCOUNTER — Encounter: Admit: 2023-01-27 | Payer: PRIVATE HEALTH INSURANCE | Attending: Family Medicine | Primary: Family Medicine

## 2023-01-27 ENCOUNTER — Telehealth
Admit: 2023-01-27 | Payer: PRIVATE HEALTH INSURANCE | Attending: Student in an Organized Health Care Education/Training Program | Primary: Family Medicine

## 2023-01-27 ENCOUNTER — Telehealth: Admit: 2023-01-27 | Payer: PRIVATE HEALTH INSURANCE | Attending: Infectious Disease | Primary: Family Medicine

## 2023-01-27 DIAGNOSIS — G062 Extradural and subdural abscess, unspecified: Secondary | ICD-10-CM

## 2023-01-27 MED ORDER — CIPROFLOXACIN 750 MG TABLET
750 | ORAL_TABLET | Freq: Two times a day (BID) | ORAL | 1 refills | Status: AC
Start: 2023-01-27 — End: ?

## 2023-01-27 NOTE — Progress Notes
 [facility]: General Infectious Diseases [address] [place], [place] [address](M) [phone] Medical Director: Heide Spark, MDDirector: Gerome Sam, MD, AAHIVSThilinie D. Worthington, MBBS Vallery Ridge, MD, PhD Shawnee Knapp, MD Tedd Sias, MD Jerel Shepherd, MD Mickel Fuchs, [place] Faylene Million, MD Sherian Rein, MD Jimmye Norman, [place] Albertina Parr, MD Loretha Stapler, MD, PhD Chancy Milroy, [place] Nani Skillern, MD, BS, AAHIVS Lianne Moris, MD, PhD  VIDEO TELEHEALTH VISIT: This clinician is part of the telehealth program and is conducting this visit in a currently approved location. For this visit the clinician and patient were present via interactive audio & video telecommunications system that permits real-time communications, via the [employer].Patient's use of the telehealth platform followed consent and acknowledges agreement to permit telehealth for this visit. State patient is located in: [place] clinician is appropriately licensed in the above state to provide care for this visit. Other individuals present during the telehealth encounter and their role/relation: spouse and caregiverBecause this visit was completed over video, a hands-on physical exam was not performed. Patient/parent or guardian understands and knows to call back if condition changes.21 minutesInfectious Disease Outpatient Follow Up VisitPatient Name:  Carlos Caldwell of Birth:  1945-10-19	EPIC MRN:  [id number] Date of Service: 10/1/2024History obtained from:  chart review and the patientHistory of Present IllnessWilliam Mcnay  is a 77 y.o. male  with past medical history significant for Charcot-Marie-Tooth disease,  b/l hip replacements, kidney stone, hyperlipidemia, remote h/o basal cell carcinoma, peripheral neuropathy, who presented on 08/14 /24 with lower back pain for 2 weeks. Found to have L5-S1 septic facet joint arthritis, L5-S1 diskitis, L4 to S2 prominent circumferential epidural phlegmon and small septated abscesses. During the hospitalization he was evaluated by IR for  possible aspiration fo phlegmon or abscess areas; but collections were too small to tap. TTE without evidence of vegetations. IR re-evaluated and were unable to find area to biopsy vertebral body. Therefore, planned to empirically treat with 6 weeks of vancomycin/CTX and metronidazole for epidural abscess/ OM with re-imaging closer to end of treatment course to determine progression/ resuolution.MRI on 01/23/2023 shows: 1. Progression of L5-S1 discovertebral/facet joint marrow and periarticular enhancement since 12/08/2022.2. Epidural phlegmon including focal right L5-S1 enhancement is largely unchanged.3. Residual right posterolateral epidural abscess represents improvement of epidural abscesses.4. Progression prevertebral edema with new S1 prevertebral abscess.Today's visitPresents today for follow-up via video with his wife. Low back pain is stable, not improved or worsened. Able to ambulate with walker.[facility]. Wants to leave ECF as soon as possible. There is an infusion company within 1 mile from home, and is willing to get daily infusions. Planned to see neurosurgery 01/30/2023. Does not want to wait for opinion of neurosurgery; has seen no progress since starting current regimen.Tentative EOT 01/18/23 but extended antibiotics until MRI which was done on 01/23/23. CRP has improved. Review of SystemsReview of Systems Constitutional:  Negative for chills and fever. Gastrointestinal:  Negative for diarrhea. Musculoskeletal:  Positive for back pain.      Low back pain not better or worse than before. Past Medical History: Diagnosis Date  BCC (basal cell carcinoma), ear, right   Patent foramen ovale   Peripheral neuropathy  Past Surgical History: Procedure Laterality Date  COLONOSCOPY  07/2014  JOINT REPLACEMENT    b/l hips  ORTHOPEDIC SURGERY    ROTATOR CUFF REPAIR Bilateral   TOTAL HIP ARTHROPLASTY  2000  TOTAL HIP ARTHROPLASTY  2003  VASCULAR SURGERY    L carotid endarterectomy family history includes  Heart failure in his father. reports that he has never smoked. He has never used smokeless tobacco. He reports that he does not drink alcohol and does not use drugs.ALLERGIESNo Known Allergies	Physical ExamThere were no vitals filed for this visit.Not performed due to the nature of video visits.LABORATORY DATALab Results Component Value Date  WBC 6.1 12/29/2022  HGB 14.7 12/29/2022  HCT 45.0 12/29/2022  MCV 94.1 12/29/2022  PLT 274 12/29/2022 Lab Results Component Value Date  CREATININE 0.69 (L) 12/29/2022  BUN 15 12/29/2022  NA 138 12/29/2022  K 4.5 12/29/2022  CL 103 12/29/2022  CO2 28 12/29/2022 CrCl cannot be calculated (Unknown ideal weight.).Lab Results Component Value Date  ALT 19 12/29/2022  AST 15 12/29/2022  ALKPHOS 61 12/29/2022  BILITOT 0.4 12/29/2022 9/5/2024Cr= 0.69 (from: 0.7)WBC= 6.1  (from: 5.0)Hg/HCT= 14.7/45  (from: 15.6/47.3)CRP= 18.4  (from: 19.9)LFT normal9/12/2024Cr=0.7  (from: 0.69)WBC= 5.7  (from: 6.1)Hg/HCT= 14.8/45.0  (from: 14.7/45.0)Elfts normalCRP=  10.2 (from: 18.4)MicrobiologyNo new results.12/08/2022 Blood Culture: NG8/15/2024 MRSA: Not detectedRadiology9/30/2024 MRI Lumbar Spiine w/ w/o IV ContrastLumbar spine MR scan was performed using sagittal and axial short and long TR/TE pulse sequences before and after injection 20 cc Dotarem gadolinium contrast.  There is interval marked progression of abnormal contrast enhancing L5 and S1 vertebral body osseous marrow signal involving mid and lower portions of the L5 vertebral body and the entire S1 vertebral body consistent with progression of discovertebral infection since the lumbar MR scan of 12/08/2022. L5-S1 disc T2 hyperintensity and thinning and mild circumferential paravertebral enhancement at the L5-S1 disc level have also progressed and there is new S1 superior subarticular 7 mm thickness T2 hyperintensity suggestive of resolving focal osseous infected collection. Enhancing signal abnormality of bilateral L5-S1 facet joint osseous components, joint spaces, and periarticular regions represent progression. Moderate enhancing infiltration of bilateral L5-S1 neural foramina represent mild progression. Broad midline prevertebral nonenhancing collection consistent with abscess at the L5-S1 disc level which extends inferiorly nearly to the mid S1 level measures 16 x 6 mm on sagittal images and is new since 12/08/2022 with additional progression of prevertebral edema signal extending superiorly to the L4-L5 level and inferiorly to the S2 level. Solid enhancing 6 mm thickness circumferential L5-S1 epidural phlegmon enhancement is present including unchanged 11 mm transverse diameter phlegmon in the right S1 lateral recess. However, previous regions of loculated nonenhancement consistent with epidural abscesses have markedly diminished and nearly resolved since 12/08/2022 with diminished compression of the thecal sac at the L5-S1 and S1 vertebral levels. Thin elongated 11 x 2 mm residual epidural abscess collection is seen posterolateral to the thecal sac on the right at the L5-S1 disc level. Minimal L4-5 degenerative malalignment and bilateral facet joint and ligamentum flavum degenerative hypertrophy result in unchanged severe L4-L5 canal stenosis. No L3-L4 disc protrusion or stenosis seen. L2-L3 and L1-L2 retrolistheses and disc bulging are unchanged. Superior L3 endplate enhancing 10 mm osteochondral Schmorl's node is unchanged. Severe right iliopsoas muscle fatty atrophy is unchanged. Impression: 1. Progression of L5-S1 discovertebral/facet joint marrow and periarticular enhancement since 12/08/2022.2. Epidural phlegmon including focal right L5-S1 enhancement is largely unchanged.3. Residual right posterolateral epidural abscess represents improvement of epidural abscesses.4. Progression prevertebral edema with new S1 prevertebral abscess. Reported and signed by: Coral Ceo, MDAssessmentPatient is a 77 y.o. male with past medical history significant for b/l hip replacements, kidney stone, hyperlipidemia, remote h/o basal cell carcinoma, peripheral neuropathy, who presented on 08/14 with lower back pain for 2 weeks. Found to have L5-S1 septic facet joint arthritis, L5-S1 diskitis, L4  to S2 prominent circumferential epidural phlegmon and small septated abscesses. TTE no vegetations, BC NG. IR Attempted/aspiration, bone biopsy, but was unable to do so.Syndrome: Lumbar spine epidural abscess/OM. Planned for empiric treatment with 6  weeks IV vancomycin, ceftriaxone and flagyl  (ended on 01/18/23 but continued till  repeat MRI).MRI shows Progression of L5-S1 discovertebral/facet joint marrow and periarticular enhancement since 12/08/2022. Epidural phlegmon including focal right L5-S1 enhancement is largely unchanged. Residual right posterolateral epidural abscess represents improvement of epidural abscesses. Progression prevertebral edema with new S1 prevertebral abscess.Symptoms have not progressed or improved. Despite MRI findings on 9/30/204, CRP 3.1 on 01/19/2023, which is reassuring. Discussed at length given MRI findings, I would like to continue current antibiotics and wait for Dr. Weyman Rodney opinion to decide further management.RecommendationsDiscussed alternative antibiotic options. Will discuss with OPAT team to see if we can get booking at [facility] infusion center for antibiotics as patient insists on leaving ECF.Since he is stable, would wait till Neurosurgery opinion on Monday. Low threshold to send him to the ER. Continue current antibiotics for now -Vancomycin/CTX and metronidazole. Discussions ongoing with OPAT team/[facility] for change in antibiotics.Update: vancomycin Tr supra therapeutic, hold Vancomycin. IR guided sampling ordered.Plan discussed with patient and wife at length. All questions answered. Follow up visit with me in one month.Scribed for Samara Snide, MBBS by Angelene Giovanni, medical scribe. October 1, 2024The documentation recorded by the scribe accurately reflects the services I personally performed and the decisions made by me. I reviewed and confirmed all material entered and/or pre-charted by the scribe.I have edited the note. Kurstyn Larios, M.B.B.S.Assistant Professor of Medicine (Infectious Diseases)Section of Infectious DiseasesDepartment of Internal MedicineYale UAL Corporation of MedicineAddendum: 01/27/23 discussed MRI with neuroradiology. Findings more enhanced/worse than before. Some could be explained by radiologic lag if patient is feeling better. But patient is not better nor worse. They reviewed imaging to see if there is a safe pocket for epidural biopsy and recommended I speak with [facility] guided neuroradiology biopsy team. They are reviewing the imaging. The timing would not change in patient vs outpatient. Spinal canal stenosis unchanged. Patient wishes to leave ECF.Plan: continue IV vancomycin (when level is therapeutic) and start ciprofloxacin 750mg  BID (will need EKG for QTC).  Hold metronidazole for now till biopsy.Send biopsy samples to bacterial/fungal/AFB cultures and path (ordered).Will ask micro lab to save sample for sequencing (messaged on call lab resident Dr. Harmon Dun).Await neurosurgical consult on Monday. Updated and d/w OPAT team.

## 2023-01-27 NOTE — Telephone Encounter
 Copied from CRM [id number]. Topic: Warm Transfer Message - [facility]>> Jan 27, 2023  1:58 PM Christie Beckers wrote:[facility]:  CALL TRANSFER MESSAGEReceived call from Venus regarding returning call to Nurse.  Call warm transferred to ID front desk.[facility] Referral Specialist

## 2023-01-27 NOTE — Telephone Encounter
 Called and wife and updated them about the plan. Discussed MRI results, plan for biopsy and plan to switch to Oritavancin and ciprofloxacin BID only because he wishes to go home. No known h/o aneurysm or tendon pain. Discussed ADR of ciprofloxacin including risk of peripheral neuropathy. He has CMT and advised to inform us if he has any worsening. Advised to add yoghurt to the diet. Discussed risk of diarrhea from antibiotics and not to take anything to stop diarrhea if he does develop. Advised to let us know of any problems that arise with antibiotics. Plan is to get oritavancin on Monday followed by neurosurgery appt and then EKG (baseline QTC). Informed him that he will get a call for biopsy.

## 2023-01-27 NOTE — Other
 [facility] Documentation NoteLAB REVIEW                                                                                      OPAT Synopsis can be found Here Summary of ResultsDate of lab draw: 10/3/24Cr= 0.69 (from: 0.67)WBC=  5.5 (from: 5.3)Hg/HCT= 14.9/46.0  (from: 15.1/46.0)CRP= <3.0  (from: 3.1)Was an Adverse Event Identified?  Yes - supratherapeutic vanco level  Vanco T 34.4Author's Role		APP (e.g. PA or APRN) Total Time Spent on this Encounter (in minutes)5 minutes

## 2023-01-27 NOTE — Telephone Encounter
 Returned call to Lawyer at [facility] OP[facility] Documentation NoteALL OTHER PHONE CALLS (for other adverse events, missing labs, etc)   OPAT Synopsis can be found Here Reason for CallOTHER                                   Other Issue: discussed new antibiotic plan with Lawyer at [facility].  Pt wanting to be discharged, concern about worsening epidural abscess, high vanco trough level this week and need for new antibiotic therapy plan.Dr. Noralee Chars spoke with radiology and abscess looks worse, most likely pt will need IR guided drainage as an outpatient which can be scheduled next week.  New Antibiotic plan:Oritivancin 1200mg  IV week one, followed by 800mg  IV weekly for 3 weeksCiprofloxacin 750mg  PO BID for 4 weeks. No cations 2 hours before or after, discuss issues with [place] to stop Vancomycin, CTX and flagyl now.Will keep PICC line in place in case we need to switch plans and possible start patient on daptomycin.  Wife will be taught by Dewitt Rota how to maintain this line.  Will be reinforced by [facility] to infuse the oritivancin weekly.  Will use PICC line unless it becomes non-functioning and then they can put in peripheral IV for infusion.  Will get weekly labs: cbc, CMP, CRPDr. Noralee Chars will call family to discuss these final antibiotic plans.Pt needs an EKG while on cipro - [facility] is unable to get EKG team in to get EKG today.  Will have pt come to clinic on Monday 12:30 before Neurosurgery appt for EKG.  Cipro 750mg  PO BID prescription sent to [facility] in [place].  MRI ordered for 4 weeks from now	Antibiotics Implicated (select all that apply) : Not Applicable Affiliated Agency: [facility] Author's Role		APP (e.g. PA or APRN) Total Time Spent on this Encounter (in minutes)45 minutes

## 2023-01-27 NOTE — Telephone Encounter
 Received call from patient.Informed patient of plan to have EKG done after appointment on 10/7.Patient and spouse verbalize understanding.

## 2023-01-27 NOTE — Telephone Encounter
 I received a call from patient's wife that she has been unable to get in contact with [facility] regarding Oritiviancin infusion for Monday (10/07). She reports patient needs to go to spine appointment and will be in [facility] by early afternoon.  She is also open to having patient go to [facility] on Monday if able to arrange.  Will send urgent message to OPAT team to address on Monday morning.

## 2023-01-27 NOTE — Telephone Encounter
 Received call from [facility] stating she would like to discuss Enzio discharge from the facility please call [phone] (ext) [phone]

## 2023-01-27 NOTE — Telephone Encounter
 Called patient spouse. No answer. LVM requesting call back.

## 2023-01-27 NOTE — Telephone Encounter
 Spoke with patient spouse Sharman Crate.Maura aware of appointment.

## 2023-01-30 ENCOUNTER — Encounter: Admit: 2023-01-30 | Payer: PRIVATE HEALTH INSURANCE | Attending: Neurological Surgery | Primary: Family Medicine

## 2023-01-30 ENCOUNTER — Ambulatory Visit: Admit: 2023-01-30 | Payer: PRIVATE HEALTH INSURANCE | Primary: Family Medicine

## 2023-01-30 ENCOUNTER — Ambulatory Visit: Admit: 2023-01-30 | Payer: PRIVATE HEALTH INSURANCE | Attending: Neurological Surgery | Primary: Family Medicine

## 2023-01-30 ENCOUNTER — Other Ambulatory Visit: Admit: 2023-01-30 | Payer: PRIVATE HEALTH INSURANCE | Attending: Emergency Medicine | Primary: Family Medicine

## 2023-01-30 DIAGNOSIS — Z87442 Personal history of urinary calculi: Secondary | ICD-10-CM

## 2023-01-30 DIAGNOSIS — G629 Polyneuropathy, unspecified: Secondary | ICD-10-CM

## 2023-01-30 DIAGNOSIS — Z85828 Personal history of other malignant neoplasm of skin: Secondary | ICD-10-CM

## 2023-01-30 DIAGNOSIS — M48061 Spinal stenosis, lumbar region without neurogenic claudication: Secondary | ICD-10-CM

## 2023-01-30 DIAGNOSIS — M51379 Other intervertebral disc degeneration, lumbosacral region without mention of lumbar back pain or lower extremity pain: Secondary | ICD-10-CM

## 2023-01-30 DIAGNOSIS — Z79899 Other long term (current) drug therapy: Secondary | ICD-10-CM

## 2023-01-30 DIAGNOSIS — M4640 Discitis, unspecified, site unspecified: Secondary | ICD-10-CM

## 2023-01-30 DIAGNOSIS — A5216 Charcot's arthropathy (tabetic): Secondary | ICD-10-CM

## 2023-01-30 DIAGNOSIS — Z7982 Long term (current) use of aspirin: Secondary | ICD-10-CM

## 2023-01-30 DIAGNOSIS — Q2112 Patent foramen ovale: Secondary | ICD-10-CM

## 2023-01-30 DIAGNOSIS — C44212 Basal cell carcinoma of skin of right ear and external auricular canal: Secondary | ICD-10-CM

## 2023-01-30 DIAGNOSIS — E785 Hyperlipidemia, unspecified: Secondary | ICD-10-CM

## 2023-01-30 DIAGNOSIS — M51369 Other intervertebral disc degeneration, lumbar region without mention of lumbar back pain or lower extremity pain: Secondary | ICD-10-CM

## 2023-01-30 DIAGNOSIS — G062 Extradural and subdural abscess, unspecified: Secondary | ICD-10-CM

## 2023-01-30 DIAGNOSIS — Z96643 Presence of artificial hip joint, bilateral: Secondary | ICD-10-CM

## 2023-01-30 DIAGNOSIS — K7460 Unspecified cirrhosis of liver: Secondary | ICD-10-CM | POA: Diagnosis not present

## 2023-01-30 LAB — CBC WITH AUTO DIFFERENTIAL
BKR WAM ABSOLUTE IMMATURE GRANULOCYTES.: 0.03 x 1000/ÂµL (ref 0.00–0.30)
BKR WAM ABSOLUTE LYMPHOCYTE COUNT.: 0.79 x 1000/ÂµL (ref 0.60–3.70)
BKR WAM ABSOLUTE NRBC (2 DEC): 0 x 1000/ÂµL (ref 0.00–1.00)
BKR WAM ANC (ABSOLUTE NEUTROPHIL COUNT): 3.42 x 1000/ÂµL (ref 2.00–7.60)
BKR WAM BASOPHIL ABSOLUTE COUNT.: 0.08 x 1000/ÂµL (ref 0.00–1.00)
BKR WAM BASOPHILS: 1.4 % (ref 0.0–1.4)
BKR WAM EOSINOPHIL ABSOLUTE COUNT.: 0.34 x 1000/ÂµL (ref 0.00–1.00)
BKR WAM EOSINOPHILS: 6.1 % — ABNORMAL HIGH (ref 0.0–5.0)
BKR WAM HEMATOCRIT (2 DEC): 44.8 % (ref 38.50–50.00)
BKR WAM HEMOGLOBIN: 14.5 g/dL (ref 13.2–17.1)
BKR WAM IMMATURE GRANULOCYTES: 0.5 % (ref 0.0–1.0)
BKR WAM LYMPHOCYTES: 14.3 % — ABNORMAL LOW (ref 17.0–50.0)
BKR WAM MCH (PG): 30.3 pg (ref 27.0–33.0)
BKR WAM MCHC: 32.4 g/dL (ref 31.0–36.0)
BKR WAM MCV: 93.5 fL (ref 80.0–100.0)
BKR WAM MONOCYTE ABSOLUTE COUNT.: 0.88 x 1000/ÂµL (ref 0.00–1.00)
BKR WAM MONOCYTES: 15.9 % — ABNORMAL HIGH (ref 4.0–12.0)
BKR WAM MPV: 10.5 fL (ref 8.0–12.0)
BKR WAM NEUTROPHILS: 61.8 % (ref 39.0–72.0)
BKR WAM NUCLEATED RED BLOOD CELLS: 0 % (ref 0.0–1.0)
BKR WAM PLATELETS: 182 x1000/ÂµL (ref 150–420)
BKR WAM RDW-CV: 13.8 % (ref 11.0–15.0)
BKR WAM RED BLOOD CELL COUNT.: 4.79 M/ÂµL (ref 4.00–6.00)
BKR WAM WHITE BLOOD CELL COUNT: 5.5 x1000/ÂµL (ref 4.0–11.0)

## 2023-01-30 LAB — COMPREHENSIVE METABOLIC PANEL
BKR ALANINE AMINOTRANSFERASE (ALT): 32 U/L (ref 16–61)
BKR ALBUMIN: 3.1 g/dL — ABNORMAL LOW (ref 3.4–5.0)
BKR ALKALINE PHOSPHATASE: 60 U/L (ref 45–117)
BKR ANION GAP (LM): 5 mmol/L (ref 5–15)
BKR ASPARTATE AMINOTRANSFERASE (AST): 31 U/L (ref 15–37)
BKR BILIRUBIN TOTAL: 0.6 mg/dL (ref <1.0)
BKR BLOOD UREA NITROGEN: 19 mg/dL — ABNORMAL HIGH (ref 7–18)
BKR CALCIUM: 9.2 mg/dL (ref 8.5–10.1)
BKR CHLORIDE: 103 mmol/L (ref 98–107)
BKR CO2: 28 mmol/L (ref 21–32)
BKR CREATININE: 0.65 mg/dL — ABNORMAL LOW (ref 0.70–1.30)
BKR EGFR, CREATININE (CKD-EPI 2021): >60 mL/min/{1.73_m2} (ref >=60)
BKR GLOBULIN: 3 g/dL (ref 2.5–5.0)
BKR GLUCOSE: 110 mg/dL (ref 65–110)
BKR POTASSIUM: 3.9 mmol/L (ref 3.5–5.1)
BKR PROTEIN TOTAL: 6.1 g/dL — ABNORMAL LOW (ref 6.4–8.2)
BKR SODIUM: 136 mmol/L (ref 136–145)

## 2023-01-30 LAB — C-REACTIVE PROTEIN     (CRP): BKR C-REACTIVE PROTEIN (LM): <0.3 mg/dL (ref <0.30)

## 2023-01-31 ENCOUNTER — Telehealth: Admit: 2023-01-31 | Payer: PRIVATE HEALTH INSURANCE | Attending: Infectious Diseases | Primary: Family Medicine

## 2023-01-31 LAB — CBC WITH DIFFERENTIAL/PLATELET
Basophils Absolute: 0 10*3/uL (ref 0.0–0.2)
Basos: 1 %
EOS (ABSOLUTE): 0.1 10*3/uL (ref 0.0–0.4)
Eos: 2 %
Hematocrit: 33 % — ABNORMAL LOW (ref 37.5–51.0)
Hemoglobin: 11.2 g/dL — ABNORMAL LOW (ref 13.0–17.7)
Immature Grans (Abs): 0 10*3/uL (ref 0.0–0.1)
Immature Granulocytes: 0 %
Lymphocytes Absolute: 1.1 10*3/uL (ref 0.7–3.1)
Lymphs: 30 %
MCH: 36.8 pg — ABNORMAL HIGH (ref 26.6–33.0)
MCHC: 33.9 g/dL (ref 31.5–35.7)
MCV: 109 fL — ABNORMAL HIGH (ref 79–97)
Monocytes Absolute: 0.5 10*3/uL (ref 0.1–0.9)
Monocytes: 13 %
Neutrophils Absolute: 2 10*3/uL (ref 1.4–7.0)
Neutrophils: 54 %
Platelets: 51 10*3/uL — CL (ref 150–450)
RBC: 3.04 x10E6/uL — ABNORMAL LOW (ref 4.14–5.80)
RDW: 12.5 % (ref 11.6–15.4)
WBC: 3.7 10*3/uL (ref 3.4–10.8)

## 2023-01-31 LAB — PROTIME-INR
INR: 1.4 — ABNORMAL HIGH (ref 0.9–1.2)
Prothrombin Time: 15.1 s — ABNORMAL HIGH (ref 9.1–12.0)

## 2023-01-31 LAB — COMPREHENSIVE METABOLIC PANEL
ALT: 41 [IU]/L (ref 0–44)
AST: 64 [IU]/L — ABNORMAL HIGH (ref 0–40)
Albumin: 3.2 g/dL — ABNORMAL LOW (ref 3.8–4.8)
Alkaline Phosphatase: 108 [IU]/L (ref 44–121)
BUN/Creatinine Ratio: 16 (ref 10–24)
BUN: 24 mg/dL (ref 8–27)
Bilirubin Total: 2.5 mg/dL — ABNORMAL HIGH (ref 0.0–1.2)
CO2: 21 mmol/L (ref 20–29)
Calcium: 8.8 mg/dL (ref 8.6–10.2)
Chloride: 111 mmol/L — ABNORMAL HIGH (ref 96–106)
Creatinine, Ser: 1.49 mg/dL — ABNORMAL HIGH (ref 0.76–1.27)
Globulin, Total: 2.7 g/dL (ref 1.5–4.5)
Glucose: 90 mg/dL (ref 70–99)
Potassium: 4.3 mmol/L (ref 3.5–5.2)
Sodium: 144 mmol/L (ref 134–144)
Total Protein: 5.9 g/dL — ABNORMAL LOW (ref 6.0–8.5)
eGFR: 48 mL/min/{1.73_m2} — ABNORMAL LOW (ref >59)

## 2023-01-31 NOTE — Telephone Encounter
 Copied from CRM #[id number]. Topic: General Inquiry - General Inquiry>> Jan 31, 2023  9:06 AM Maricela Bo wrote:[place] Maura called regarding Needle Biopsy. Maura states Dr. Newton Pigg informed her Mason had a Needle Biopsy scheduled by Dr. Renette Butters, but he could not provide appointment details. Sharman Crate is requesting a call back at [phone] to discuss.

## 2023-01-31 NOTE — Progress Notes
 Patient Name: Carlos Caldwell of Birth: Aug 16, 1947Date of Visit: 10/7/2024History of present illness:Carlos Caldwell is a 77 y.o. male who presents with past medical history significant for Charcot-Marie-Tooth disease,  b/l hip replacements, kidney stone, hyperlipidemia, remote h/o basal cell carcinoma, peripheral neuropathy, who presents with concerns for bilateral lower back pain, comes in today for an initial evaluation and to establish care. The patient was referred to by Dr. 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 He was last seen by the referring provider on 01/24/2023, note reviewed as follows:  Presents today for follow-up via video with his wife. Low back pain is stable, not improved or worsened. Able to ambulate with walker.Currently staying at [facility]. Wants to leave ECF as soon as possible. There is an infusion company within 1 mile from home, and is willing to get daily infusions. Planned to see neurosurgery 01/30/2023. Does not want to wait for opinion of neurosurgery; has seen no progress since starting current regimen. Tentative EOT 01/18/23 but extended antibiotics until MRI which was done on 01/23/23. CRP has improved. Hedescribeshissymptoms of pain as follows: Past Medical History:He  has a past medical history of BCC (basal cell carcinoma), ear, right, Patent foramen ovale, and Peripheral neuropathy. Past Surgical History: He  has a past surgical history that includes Rotator cuff repair (Bilateral); Total hip arthroplasty (2000); Total hip arthroplasty (2003); orthopedic surgery; Colonoscopy (07/2014); Joint replacement; and Vascular surgery.Medications:Current Outpatient Medications Medication Sig Dispense Refill  aspirin 81 MG EC tablet Take 1 tablet (81 mg total) by mouth daily.    cholecalciferol (VITAMIN D) 1,000 unit tablet Take 1 tablet (1,000 Units total) by mouth daily.    ciprofloxacin HCl (CIPRO) 750 mg tablet Take 1 tablet (750 mg total) by mouth 2 (two) times daily. 56 tablet 0  fluticasone propionate (FLONASE) 50 mcg/actuation nasal spray SHAKE LIQUID AND USE 1 SPRAY IN EACH NOSTRIL DAILY 48 g 2  lidocaine (LIDODERM) 5 % Place 1 patch onto the skin every 24 hours. Remove & Discard patch within 12 hours or as directed by MD 30 patch 0  loratadine (CLARITIN) 10 mg tablet Take 1 tablet (10 mg total) by mouth.    Miscellaneous Medical Supply Use as directed.  Patient needs customize bilateral AFO braces.Dx G.60.0, R26.9, R23.8 1 each 0  multivitamin capsule Take 1 capsule by mouth daily.    polyethylene glycol (MIRALAX) 17 gram packet Take 1 packet (17 g total) by mouth daily. Mix in 8 ounces of water, juice, soda, coffee or tea prior to taking. 14 each 2  potassium citrate (UROCIT-K) 10 mEq (1,080 mg) extended release tablet     predniSONE (DELTASONE) 10 mg tablet 3 tabs x 4days, 2 tabs x 4days 1 tabs x 4day, then 0.5 tab x 4 days. Take with food 32 tablet 0  sildenafiL (VIAGRA) 100 mg tablet TAKE 1 TABLET(100 MG) BY MOUTH DAILY AS NEEDED FOR ERECTILE DYSFUNCTION 18 tablet 2  simvastatin (ZOCOR) 10 mg tablet TAKE 1 TABLET(10 MG) BY MOUTH EVERY NIGHT 90 tablet 3  zolpidem (AMBIEN) 10 mg tablet Take 1 tablet (10 mg total) by mouth nightly as needed.. 90 tablet 1 No current facility-administered medications for this visit.  Allergies: He has No Known Allergies.Family History: family history includes Heart failure in his father.Social history: He  reports no history of drug use.He  reports no history of alcohol use.He  reports that he has never smoked. He has  never used smokeless tobacco.Review of Systems: I have reviewed the review of systems with the patient, as noted by the clinical support staff.  This complete review of symptoms helps to identify any additional risk in surgical consideration.Physical Examination: There were no vitals taken for this visit.General Appearance - Well developed, well nourishedMood/Affect - NormalApparent Distress - NoneSkin - Normal temperature, turgor, color, and moisture with no echhymosis, cyanosis, clubbing, or hyper/hypopigmented lesionsHead/neck- No restriction in cervical flexion, extension, rotation, or lateral bending- No pain with cervical flexion, extension, rotation, or lateral bending- No tenderness to palpation over posterior cervical spine or trapezius muscles- No previous incisions- Negative Spurling?s maneuvers- Negative Lhermitte?s signSpine/ribs/pelvis- No tenderness to palpation over spine or buttocks- No previous incisions- No restriction in thoracolumbar flexion, extension, rotation, or lateral bending- No pain in back or legs with flexion or extension-- No elevation of shoulders or pelvisExtremities- Normal peripheral pulses with no edema, swelling, varicosities, or lymphadenopathy- Normal alignment and muscle tone with no masses, asymmetric atrophy/hypertrophy, tenderness to palpation, ligamentous instability, or crepitus/effusions/subluxations of the major joints- Negative straight leg raising testsNeurologic- -Gait:       Non-antalgic gait      Does not require assistive device for ambulation      No difficulties walking on heels/toes- Strength	R/L Deltoids - 5/5	R/L Biceps - 5/5	R/L Wrist extensors - 5/5	R/L Triceps - 5/5	R/L Wrist flexors - 5/5            R/L Finger Extensors-5/5	R/L Hand intrinsics - 5/5	R/L Hip flexors - 5/5	R/L Quads - 5/5	R/L TA - 5/5	R/L EHL - 5/5	R/L Gastroc - 5/5- Sensation 	R/L C5 - Normal 	R/L C6 - Normal	R/L C7 - Normal	R/L C8 - Normal	R/L T1-L1 - Normal	R/L L2 - Normal	R/L L3 - Normal	R/L L4 - Normal	R/L L5 - Normal	R/L S1 - Normal- Reflexes	R/L Biceps - Normal	R/L BR - Normal	R/L Triceps - Normal	R/L Quad - Normal	R/L Achilles - Normal	Negative Hoffman?s signs. 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 No acute fracture or subluxation.MRI Lumbar Spine w wo IV Contrast (01/23/2023)Impression: 1. Progression of L5-S1 discovertebral/facet joint marrow and periarticular enhancement since 12/08/2022.2. Epidural phlegmon including focal right L5-S1 enhancement is largely unchanged.3. Residual right posterolateral epidural abscess represents improvement of epidural abscesses.4. Progression prevertebral edema with new S1 prevertebral abscess.Assessment/Plan:    Carlos Caldwell is a 77 y.o. male who presents with past medical history significant for Charcot-Marie-Tooth disease,  b/l hip replacements, kidney stone, hyperlipidemia, remote h/o basal cell carcinoma, peripheral neuropathy, who presents with concerns for bilateral lower back pain, comes in today for an initial evaluation and to establish care. The patient was referred to by Dr. 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 We reviewed and discussed MRI lumbar spine from 01/23/2023, noted to have progression of L5-S1 discovertebral/facet joint marrow and periarticular enhancement since 12/08/2022. Epidural phlegmon including focal right L5-S1 enhancement is largely unchanged. Residual right posterolateral epidural abscess represents improvement of epidural abscesses. Progression prevertebral edema with new S1 prevertebral abscess. Reviewed imaging study noted above with patient and its meaning/significance.  Images shown to patient with explanation.We discussed that even though on imaging he has had some progression of his infection, clinically he seems to be improving.  His CRP repeated today was normal which shows significant improvement from his prior which is already decreasing from his initial presentation CRP.  Denies any  fever chills nausea vomiting or focal deficits.  He also stapled ambulate which initially presented to he was not.  An MRI whenever the abscess seems to be improving the LS and severe unstable but he has a new prevertebral collections.  I discussed that a biopsy could be considered with CRP was worsening now there are findings of involvement of the L5 and S1 vertebral bodies.  We discussed there are 3 treatment possibilities.  Treatment option 1. Would be to continue with his current medications and to monitor his CRP and ESR levels particularly worsening.  This would also entail an MRI in several weeks for reassessment for interval changes.Treatment option 2. Would entail a biopsy to assess that no other pathogen and strong and causing his worsening radiographic findings.  Oxygen 3.  Of course would involve surgical intervention but given his clinical improvement they should be left as a last resort.  Given his improvement in CRP and may not be unreasonable to follow clinically and to repeat his MRI as planned.  Airways and 18 lb MRI biopsy can be repeated that time to rule out any other causes of infection.  We will see him back after his MRI.No follow-ups on file.Suzzette Righter, [facility]:

## 2023-01-31 NOTE — Progress Notes
 Review of Systems Musculoskeletal:  Positive for back pain.      Patient presents today for new patient visit, c/o lower back radiating down to Bl buttocks x 11/2022.Patient denies any fall or accident.

## 2023-01-31 NOTE — Other
 [facility] Documentation NoteLAB REVIEW                                                                                      OPAT Synopsis can be found Here Summary of ResultsDate of lab draw: 10/7/24Cr=0.65  (from: 0.69)WBC=  5.5 (from: 6.1)Hg/HCT= 14.5/44.8  (from: 14.7/45.0)CRP=  <0.3 (from: not done)Was an Adverse Event Identified?  No Author's Role		APP (e.g. PA or APRN) Total Time Spent on this Encounter (in minutes)5 minutes

## 2023-01-31 NOTE — Telephone Encounter
 Reached Carlos Caldwell & Carlos Caldwell and per Dr Shyrl Numbers related the recommendations presented by Dr Newton Pigg as written in his documentation from the 01/30/2023 encounter. Treatment option 1. Would be to continue with his current medications and to monitor his CRP and ESR levels particularly worsening.  This would also entail an MRI in several weeks for reassessment for interval changes. Treatment option 2. Would entail a biopsy to assess that no other pathogen and strong and causing his worsening radiographic findings.  Option 3.  Of course would involve surgical intervention but given his clinical improvement they should be left as a last resort.  Given his improvement in CRP and may not be unreasonable to follow clinically and to repeat his MRI as planned.Related that Dr Shyrl Numbers is in agreement with these recommendations.Patient/family confirmed that they will continue with weekly Oritivancin and Oral Cipro as ordered and Monitoring Labs weekly.  Pt is aware of upcoming MRI on 10/28 and f/u with Dr Shyrl Numbers on 10/31 to review the findings of the MRI. This visit was converted to Telehealth per pt request. No further questions raised at this time. Encouraged to call back if there are any further concerns.

## 2023-02-06 ENCOUNTER — Telehealth: Payer: Self-pay

## 2023-02-06 ENCOUNTER — Other Ambulatory Visit: Admit: 2023-02-06 | Payer: PRIVATE HEALTH INSURANCE | Attending: Emergency Medicine | Primary: Family Medicine

## 2023-02-06 DIAGNOSIS — M4640 Discitis, unspecified, site unspecified: Secondary | ICD-10-CM

## 2023-02-06 LAB — CBC WITH AUTO DIFFERENTIAL
BKR WAM ABSOLUTE IMMATURE GRANULOCYTES.: 0.04 x 1000/ÂµL (ref 0.00–0.30)
BKR WAM ABSOLUTE LYMPHOCYTE COUNT.: 0.67 x 1000/ÂµL (ref 0.60–3.70)
BKR WAM ABSOLUTE NRBC (2 DEC): 0 x 1000/ÂµL (ref 0.00–1.00)
BKR WAM ANC (ABSOLUTE NEUTROPHIL COUNT): 3.51 x 1000/ÂµL (ref 2.00–7.60)
BKR WAM BASOPHIL ABSOLUTE COUNT.: 0.05 x 1000/ÂµL (ref 0.00–1.00)
BKR WAM BASOPHILS: 1 % (ref 0.0–1.4)
BKR WAM EOSINOPHIL ABSOLUTE COUNT.: 0.24 x 1000/ÂµL (ref 0.00–1.00)
BKR WAM EOSINOPHILS: 5 % (ref 0.0–5.0)
BKR WAM HEMATOCRIT (2 DEC): 46.4 % (ref 38.50–50.00)
BKR WAM HEMOGLOBIN: 15.1 g/dL (ref 13.2–17.1)
BKR WAM IMMATURE GRANULOCYTES: 0.8 % (ref 0.0–1.0)
BKR WAM LYMPHOCYTES: 14 % — ABNORMAL LOW (ref 17.0–50.0)
BKR WAM MCH (PG): 30.4 pg (ref 27.0–33.0)
BKR WAM MCHC: 32.5 g/dL (ref 31.0–36.0)
BKR WAM MCV: 93.4 fL (ref 80.0–100.0)
BKR WAM MONOCYTE ABSOLUTE COUNT.: 0.28 x 1000/ÂµL (ref 0.00–1.00)
BKR WAM MONOCYTES: 5.8 % (ref 4.0–12.0)
BKR WAM MPV: 10.6 fL (ref 8.0–12.0)
BKR WAM NEUTROPHILS: 73.4 % — ABNORMAL HIGH (ref 39.0–72.0)
BKR WAM NUCLEATED RED BLOOD CELLS: 0 % (ref 0.0–1.0)
BKR WAM PLATELETS: 172 x1000/ÂµL (ref 150–420)
BKR WAM RDW-CV: 13.9 % (ref 11.0–15.0)
BKR WAM RED BLOOD CELL COUNT.: 4.97 M/ÂµL (ref 4.00–6.00)
BKR WAM WHITE BLOOD CELL COUNT: 4.8 x1000/ÂµL (ref 4.0–11.0)

## 2023-02-06 LAB — COMPREHENSIVE METABOLIC PANEL
BKR ALANINE AMINOTRANSFERASE (ALT): 43 U/L (ref 16–61)
BKR ALBUMIN: 3.1 g/dL — ABNORMAL LOW (ref 3.4–5.0)
BKR ALKALINE PHOSPHATASE: 73 U/L (ref 45–117)
BKR ANION GAP (LM): 6 mmol/L (ref 5–15)
BKR ASPARTATE AMINOTRANSFERASE (AST): 31 U/L (ref 15–37)
BKR BILIRUBIN TOTAL: 0.6 mg/dL (ref <1.0)
BKR BLOOD UREA NITROGEN: 16 mg/dL (ref 7–18)
BKR CALCIUM: 9.1 mg/dL (ref 8.5–10.1)
BKR CHLORIDE: 105 mmol/L (ref 98–107)
BKR CO2: 29 mmol/L (ref 21–32)
BKR CREATININE: 0.75 mg/dL (ref 0.70–1.30)
BKR EGFR, CREATININE (CKD-EPI 2021): >60 mL/min/{1.73_m2} (ref >=60)
BKR GLOBULIN: 3.1 g/dL (ref 2.5–5.0)
BKR GLUCOSE: 102 mg/dL (ref 65–110)
BKR POTASSIUM: 3.7 mmol/L (ref 3.5–5.1)
BKR PROTEIN TOTAL: 6.2 g/dL — ABNORMAL LOW (ref 6.4–8.2)
BKR SODIUM: 140 mmol/L (ref 136–145)

## 2023-02-06 LAB — C-REACTIVE PROTEIN     (CRP): BKR C-REACTIVE PROTEIN (LM): <0.3 mg/dL (ref <0.30)

## 2023-02-06 NOTE — Telephone Encounter (Signed)
Pt called regarding recent labs and is wanting to know the results/recommendations.

## 2023-02-06 NOTE — Telephone Encounter (Signed)
See result note.  

## 2023-02-07 ENCOUNTER — Encounter: Admit: 2023-02-07 | Payer: PRIVATE HEALTH INSURANCE | Attending: Family Medicine | Primary: Family Medicine

## 2023-02-07 ENCOUNTER — Telehealth: Admit: 2023-02-07 | Payer: PRIVATE HEALTH INSURANCE | Attending: Family Medicine | Primary: Family Medicine

## 2023-02-07 NOTE — Other
 [facility] Documentation NoteLAB REVIEW                                                                                      OPAT Synopsis can be found Here Summary of ResultsDate of lab draw: 10/14/24Cr= 0.75 (from: 0.65)WBC= 4.8  (from: 5.5)Hg/HCT= 15.1/46.4  (from: 14.5/44.8)CRP= <0.3  (from: <0.3)Was an Adverse Event Identified?  No Author's Role		APP (e.g. PA or APRN) Total Time Spent on this Encounter (in minutes)5 minutes

## 2023-02-08 ENCOUNTER — Other Ambulatory Visit: Payer: Self-pay

## 2023-02-08 DIAGNOSIS — K7460 Unspecified cirrhosis of liver: Secondary | ICD-10-CM

## 2023-02-13 ENCOUNTER — Other Ambulatory Visit: Admit: 2023-02-13 | Payer: PRIVATE HEALTH INSURANCE | Attending: Emergency Medicine | Primary: Family Medicine

## 2023-02-13 DIAGNOSIS — M4640 Discitis, unspecified, site unspecified: Secondary | ICD-10-CM

## 2023-02-13 LAB — CBC WITH AUTO DIFFERENTIAL
BKR WAM ABSOLUTE IMMATURE GRANULOCYTES.: 0.02 x 1000/ÂµL (ref 0.00–0.30)
BKR WAM ABSOLUTE LYMPHOCYTE COUNT.: 0.84 x 1000/ÂµL (ref 0.60–3.70)
BKR WAM ABSOLUTE NRBC (2 DEC): 0 x 1000/ÂµL (ref 0.00–1.00)
BKR WAM ANC (ABSOLUTE NEUTROPHIL COUNT): 2.62 x 1000/ÂµL (ref 2.00–7.60)
BKR WAM BASOPHIL ABSOLUTE COUNT.: 0.05 x 1000/ÂµL (ref 0.00–1.00)
BKR WAM BASOPHILS: 1.1 % (ref 0.0–1.4)
BKR WAM EOSINOPHIL ABSOLUTE COUNT.: 0.26 x 1000/ÂµL (ref 0.00–1.00)
BKR WAM EOSINOPHILS: 5.9 % — ABNORMAL HIGH (ref 0.0–5.0)
BKR WAM HEMATOCRIT (2 DEC): 44.3 % (ref 38.50–50.00)
BKR WAM HEMOGLOBIN: 14.7 g/dL (ref 13.2–17.1)
BKR WAM IMMATURE GRANULOCYTES: 0.5 % (ref 0.0–1.0)
BKR WAM LYMPHOCYTES: 19.1 % (ref 17.0–50.0)
BKR WAM MCH (PG): 30.9 pg (ref 27.0–33.0)
BKR WAM MCHC: 33.2 g/dL (ref 31.0–36.0)
BKR WAM MCV: 93.3 fL (ref 80.0–100.0)
BKR WAM MONOCYTE ABSOLUTE COUNT.: 0.61 x 1000/ÂµL (ref 0.00–1.00)
BKR WAM MONOCYTES: 13.9 % — ABNORMAL HIGH (ref 4.0–12.0)
BKR WAM MPV: 10.7 fL (ref 8.0–12.0)
BKR WAM NEUTROPHILS: 59.5 % (ref 39.0–72.0)
BKR WAM NUCLEATED RED BLOOD CELLS: 0 % (ref 0.0–1.0)
BKR WAM PLATELETS: 156 x1000/ÂµL (ref 150–420)
BKR WAM RDW-CV: 14.3 % (ref 11.0–15.0)
BKR WAM RED BLOOD CELL COUNT.: 4.75 M/ÂµL (ref 4.00–6.00)
BKR WAM WHITE BLOOD CELL COUNT: 4.4 x1000/ÂµL (ref 4.0–11.0)

## 2023-02-13 LAB — COMPREHENSIVE METABOLIC PANEL
BKR ALANINE AMINOTRANSFERASE (ALT): 56 U/L (ref 16–61)
BKR ALBUMIN: 3.1 g/dL — ABNORMAL LOW (ref 3.4–5.0)
BKR ALKALINE PHOSPHATASE: 75 U/L (ref 45–117)
BKR ANION GAP (LM): 5 mmol/L (ref 5–15)
BKR ASPARTATE AMINOTRANSFERASE (AST): 42 U/L — ABNORMAL HIGH (ref 15–37)
BKR BILIRUBIN TOTAL: 0.6 mg/dL (ref <1.0)
BKR BLOOD UREA NITROGEN: 16 mg/dL (ref 7–18)
BKR CALCIUM: 8.8 mg/dL (ref 8.5–10.1)
BKR CHLORIDE: 104 mmol/L (ref 98–107)
BKR CO2: 28 mmol/L (ref 21–32)
BKR CREATININE: 0.63 mg/dL — ABNORMAL LOW (ref 0.70–1.30)
BKR EGFR, CREATININE (CKD-EPI 2021): >60 mL/min/{1.73_m2} (ref >=60)
BKR GLOBULIN: 3.2 g/dL (ref 2.5–5.0)
BKR GLUCOSE: 92 mg/dL (ref 65–110)
BKR POTASSIUM: 3.9 mmol/L (ref 3.5–5.1)
BKR PROTEIN TOTAL: 6.3 g/dL — ABNORMAL LOW (ref 6.4–8.2)
BKR SODIUM: 137 mmol/L (ref 136–145)

## 2023-02-13 LAB — C-REACTIVE PROTEIN     (CRP): BKR C-REACTIVE PROTEIN (LM): 0.62 mg/dL — ABNORMAL HIGH (ref <0.30)

## 2023-02-14 ENCOUNTER — Telehealth: Admit: 2023-02-14 | Payer: PRIVATE HEALTH INSURANCE | Attending: Neurological Surgery | Primary: Family Medicine

## 2023-02-14 NOTE — Other
 [facility] Documentation NoteLAB REVIEW                                                                                      OPAT Synopsis can be found Here Summary of ResultsDate of lab draw: 10/21/24Cr= 0.63 (from: 0.75)WBC= 4.4  (from: 4.8)Hg/HCT= 14.7/44.3 (from: 15.1/46.4)eos= 5.9  (from: 5.0)CRP= 0.62  (from: <0.3)Was an Adverse Event Identified?  No Author's Role		APP (e.g. PA or APRN) Total Time Spent on this Encounter (in minutes)5 minutes

## 2023-02-15 ENCOUNTER — Telehealth: Admit: 2023-02-15 | Payer: PRIVATE HEALTH INSURANCE | Attending: Family Medicine | Primary: Family Medicine

## 2023-02-15 ENCOUNTER — Encounter: Admit: 2023-02-15 | Payer: PRIVATE HEALTH INSURANCE | Attending: Family Medicine | Primary: Family Medicine

## 2023-02-15 DIAGNOSIS — E8779 Other fluid overload: Secondary | ICD-10-CM | POA: Diagnosis not present

## 2023-02-15 DIAGNOSIS — M7989 Other specified soft tissue disorders: Secondary | ICD-10-CM | POA: Diagnosis not present

## 2023-02-15 DIAGNOSIS — I517 Cardiomegaly: Secondary | ICD-10-CM | POA: Diagnosis not present

## 2023-02-15 DIAGNOSIS — R609 Edema, unspecified: Secondary | ICD-10-CM | POA: Diagnosis not present

## 2023-02-15 DIAGNOSIS — R600 Localized edema: Secondary | ICD-10-CM | POA: Diagnosis not present

## 2023-02-15 MED ORDER — DIAZEPAM 5 MG TABLET
5 | ORAL_TABLET | ORAL | 1 refills | 10.00000 days | Status: AC
Start: 2023-02-15 — End: 2023-02-16

## 2023-02-15 NOTE — Telephone Encounter
 Copied from CRM [id number]. Topic: General Message - [facility]>> Feb 14, 2023 10:31 AM Estill Batten wrote:[facility] MESSAGETime of call:   10:31 AMCaller:   [name], [name] Caller's relationship to patient:  self and wife   Calling from (pharmacy, hospital, agency, etc.):  n/a   Reason for call:   Request for Dr Newton Pigg to please discuss patient 02/20/23 imaging before patients 02/27/23 appointment  with Dr Newton Pigg If not feeling well, what are symptoms:  n/a   If having symptoms, how long have the symptoms been present:  n/a   Does caller request to speak to someone urgently?  no   Best telephone number for callback:   [phone] Best time to return call:   any Permission to leave message:  yes   [facility]

## 2023-02-15 NOTE — Telephone Encounter
 Will review imaging results with Dr Newton Pigg on 11/4 and re-access to the future plan by Dr Newton Pigg

## 2023-02-16 ENCOUNTER — Encounter: Admit: 2023-02-16 | Payer: PRIVATE HEALTH INSURANCE | Attending: Family Medicine | Primary: Family Medicine

## 2023-02-16 ENCOUNTER — Ambulatory Visit: Admit: 2023-02-16 | Payer: Medicare (Managed Care) | Primary: Family Medicine

## 2023-02-16 DIAGNOSIS — M7989 Other specified soft tissue disorders: Secondary | ICD-10-CM | POA: Diagnosis not present

## 2023-02-16 DIAGNOSIS — K766 Portal hypertension: Secondary | ICD-10-CM | POA: Diagnosis not present

## 2023-02-16 DIAGNOSIS — E8809 Other disorders of plasma-protein metabolism, not elsewhere classified: Secondary | ICD-10-CM | POA: Diagnosis not present

## 2023-02-16 DIAGNOSIS — E8779 Other fluid overload: Secondary | ICD-10-CM | POA: Diagnosis not present

## 2023-02-16 DIAGNOSIS — N189 Chronic kidney disease, unspecified: Secondary | ICD-10-CM | POA: Insufficient documentation

## 2023-02-16 DIAGNOSIS — I129 Hypertensive chronic kidney disease with stage 1 through stage 4 chronic kidney disease, or unspecified chronic kidney disease: Secondary | ICD-10-CM | POA: Diagnosis not present

## 2023-02-16 DIAGNOSIS — N183 Chronic kidney disease, stage 3 unspecified: Secondary | ICD-10-CM | POA: Diagnosis not present

## 2023-02-16 DIAGNOSIS — E8770 Fluid overload, unspecified: Secondary | ICD-10-CM | POA: Diagnosis present

## 2023-02-16 DIAGNOSIS — D72819 Decreased white blood cell count, unspecified: Secondary | ICD-10-CM | POA: Diagnosis not present

## 2023-02-16 DIAGNOSIS — D7589 Other specified diseases of blood and blood-forming organs: Secondary | ICD-10-CM | POA: Diagnosis not present

## 2023-02-16 DIAGNOSIS — D631 Anemia in chronic kidney disease: Secondary | ICD-10-CM | POA: Diagnosis not present

## 2023-02-16 DIAGNOSIS — K7030 Alcoholic cirrhosis of liver without ascites: Secondary | ICD-10-CM | POA: Diagnosis not present

## 2023-02-16 DIAGNOSIS — R600 Localized edema: Secondary | ICD-10-CM | POA: Diagnosis not present

## 2023-02-16 MED ORDER — DIAZEPAM 5 MG TABLET
5 | ORAL_TABLET | ORAL | 1 refills | 10.00000 days | Status: AC
Start: 2023-02-16 — End: 2023-06-22

## 2023-02-17 DIAGNOSIS — E8809 Other disorders of plasma-protein metabolism, not elsewhere classified: Secondary | ICD-10-CM | POA: Diagnosis not present

## 2023-02-17 DIAGNOSIS — K7030 Alcoholic cirrhosis of liver without ascites: Secondary | ICD-10-CM | POA: Diagnosis not present

## 2023-02-17 DIAGNOSIS — D539 Nutritional anemia, unspecified: Secondary | ICD-10-CM | POA: Diagnosis not present

## 2023-02-17 DIAGNOSIS — R001 Bradycardia, unspecified: Secondary | ICD-10-CM | POA: Diagnosis not present

## 2023-02-17 DIAGNOSIS — Z789 Other specified health status: Secondary | ICD-10-CM | POA: Diagnosis not present

## 2023-02-17 DIAGNOSIS — I455 Other specified heart block: Secondary | ICD-10-CM | POA: Diagnosis not present

## 2023-02-17 DIAGNOSIS — E8770 Fluid overload, unspecified: Secondary | ICD-10-CM | POA: Diagnosis not present

## 2023-02-17 DIAGNOSIS — E778 Other disorders of glycoprotein metabolism: Secondary | ICD-10-CM | POA: Diagnosis not present

## 2023-02-17 DIAGNOSIS — Z0289 Encounter for other administrative examinations: Secondary | ICD-10-CM | POA: Diagnosis not present

## 2023-02-17 DIAGNOSIS — D696 Thrombocytopenia, unspecified: Secondary | ICD-10-CM | POA: Diagnosis not present

## 2023-02-18 DIAGNOSIS — E8809 Other disorders of plasma-protein metabolism, not elsewhere classified: Secondary | ICD-10-CM | POA: Diagnosis not present

## 2023-02-18 DIAGNOSIS — D539 Nutritional anemia, unspecified: Secondary | ICD-10-CM | POA: Diagnosis not present

## 2023-02-18 DIAGNOSIS — K7030 Alcoholic cirrhosis of liver without ascites: Secondary | ICD-10-CM | POA: Diagnosis not present

## 2023-02-18 DIAGNOSIS — I455 Other specified heart block: Secondary | ICD-10-CM | POA: Diagnosis not present

## 2023-02-18 DIAGNOSIS — E778 Other disorders of glycoprotein metabolism: Secondary | ICD-10-CM | POA: Diagnosis not present

## 2023-02-18 DIAGNOSIS — Z789 Other specified health status: Secondary | ICD-10-CM | POA: Diagnosis not present

## 2023-02-18 DIAGNOSIS — E8770 Fluid overload, unspecified: Secondary | ICD-10-CM | POA: Diagnosis not present

## 2023-02-18 DIAGNOSIS — D696 Thrombocytopenia, unspecified: Secondary | ICD-10-CM | POA: Diagnosis not present

## 2023-02-18 DIAGNOSIS — R001 Bradycardia, unspecified: Secondary | ICD-10-CM | POA: Diagnosis not present

## 2023-02-20 ENCOUNTER — Inpatient Hospital Stay: Admit: 2023-02-20 | Discharge: 2023-02-20 | Payer: PRIVATE HEALTH INSURANCE | Primary: Family Medicine

## 2023-02-20 ENCOUNTER — Other Ambulatory Visit: Admit: 2023-02-20 | Payer: PRIVATE HEALTH INSURANCE | Attending: Emergency Medicine | Primary: Family Medicine

## 2023-02-20 DIAGNOSIS — G062 Extradural and subdural abscess, unspecified: Secondary | ICD-10-CM

## 2023-02-20 DIAGNOSIS — M4640 Discitis, unspecified, site unspecified: Secondary | ICD-10-CM

## 2023-02-20 DIAGNOSIS — I5032 Chronic diastolic (congestive) heart failure: Secondary | ICD-10-CM | POA: Diagnosis not present

## 2023-02-20 DIAGNOSIS — K7031 Alcoholic cirrhosis of liver with ascites: Secondary | ICD-10-CM | POA: Diagnosis not present

## 2023-02-20 DIAGNOSIS — Z6830 Body mass index (BMI) 30.0-30.9, adult: Secondary | ICD-10-CM | POA: Diagnosis not present

## 2023-02-20 LAB — CBC WITH AUTO DIFFERENTIAL
BKR WAM ABSOLUTE IMMATURE GRANULOCYTES.: 0.03 x 1000/ÂµL (ref 0.00–0.30)
BKR WAM ABSOLUTE LYMPHOCYTE COUNT.: 0.96 x 1000/ÂµL (ref 0.60–3.70)
BKR WAM ABSOLUTE NRBC (2 DEC): 0 x 1000/ÂµL (ref 0.00–1.00)
BKR WAM ANC (ABSOLUTE NEUTROPHIL COUNT): 3.53 x 1000/ÂµL (ref 2.00–7.60)
BKR WAM BASOPHIL ABSOLUTE COUNT.: 0.05 x 1000/ÂµL (ref 0.00–1.00)
BKR WAM BASOPHILS: 0.9 % (ref 0.0–1.4)
BKR WAM EOSINOPHIL ABSOLUTE COUNT.: 0.24 x 1000/ÂµL (ref 0.00–1.00)
BKR WAM EOSINOPHILS: 4.4 % (ref 0.0–5.0)
BKR WAM HEMATOCRIT (2 DEC): 45.4 % (ref 38.50–50.00)
BKR WAM HEMOGLOBIN: 14.7 g/dL (ref 13.2–17.1)
BKR WAM IMMATURE GRANULOCYTES: 0.5 % (ref 0.0–1.0)
BKR WAM LYMPHOCYTES: 17.5 % (ref 17.0–50.0)
BKR WAM MCH (PG): 30.2 pg (ref 27.0–33.0)
BKR WAM MCHC: 32.4 g/dL (ref 31.0–36.0)
BKR WAM MCV: 93.2 fL (ref 80.0–100.0)
BKR WAM MONOCYTE ABSOLUTE COUNT.: 0.67 x 1000/ÂµL (ref 0.00–1.00)
BKR WAM MONOCYTES: 12.2 % — ABNORMAL HIGH (ref 4.0–12.0)
BKR WAM MPV: 10.2 fL (ref 8.0–12.0)
BKR WAM NEUTROPHILS: 64.5 % (ref 39.0–72.0)
BKR WAM NUCLEATED RED BLOOD CELLS: 0 % (ref 0.0–1.0)
BKR WAM PLATELETS: 166 x1000/ÂµL (ref 150–420)
BKR WAM RDW-CV: 14.3 % (ref 11.0–15.0)
BKR WAM RED BLOOD CELL COUNT.: 4.87 M/ÂµL (ref 4.00–6.00)
BKR WAM WHITE BLOOD CELL COUNT: 5.5 x1000/ÂµL (ref 4.0–11.0)

## 2023-02-20 LAB — COMPREHENSIVE METABOLIC PANEL
BKR ALANINE AMINOTRANSFERASE (ALT): 52 U/L (ref 16–61)
BKR ALBUMIN: 3.2 g/dL — ABNORMAL LOW (ref 3.4–5.0)
BKR ALKALINE PHOSPHATASE: 76 U/L (ref 45–117)
BKR ANION GAP (LM): 4 mmol/L — ABNORMAL LOW (ref 5–15)
BKR ASPARTATE AMINOTRANSFERASE (AST): 31 U/L (ref 15–37)
BKR BILIRUBIN TOTAL: 0.6 mg/dL (ref <1.0)
BKR BLOOD UREA NITROGEN: 18 mg/dL (ref 7–18)
BKR CALCIUM: 9 mg/dL (ref 8.5–10.1)
BKR CHLORIDE: 106 mmol/L (ref 98–107)
BKR CO2: 28 mmol/L (ref 21–32)
BKR CREATININE: 0.8 mg/dL (ref 0.70–1.30)
BKR EGFR, CREATININE (CKD-EPI 2021): >60 mL/min/{1.73_m2} (ref >=60)
BKR GLOBULIN: 3.1 g/dL (ref 2.5–5.0)
BKR GLUCOSE: 72 mg/dL (ref 65–110)
BKR POTASSIUM: 4.2 mmol/L (ref 3.5–5.1)
BKR PROTEIN TOTAL: 6.3 g/dL — ABNORMAL LOW (ref 6.4–8.2)
BKR SODIUM: 138 mmol/L (ref 136–145)

## 2023-02-20 LAB — C-REACTIVE PROTEIN     (CRP): BKR C-REACTIVE PROTEIN (LM): <0.3 mg/dL (ref <0.30)

## 2023-02-20 MED ORDER — GADOTERATE MEGLUMINE 0.5 MMOL/ML (376.9 MG/ML) INTRAVENOUS SOLUTION
0.5 | Freq: Once | INTRAVENOUS | Status: CP | PRN
Start: 2023-02-20 — End: ?
  Administered 2023-02-20: 20:00:00 0.5 mL via INTRAVENOUS

## 2023-02-20 NOTE — Other
 [facility] Documentation NoteLAB REVIEW                                                                                      OPAT Synopsis can be found Here Summary of ResultsDate of lab draw: 10/28/24Cr= 0.8 (from: 0.63)WBC=  5.5 (from: 4.4)Hg/HCT= 14.7/45.4  (from: 14.7/44.30)CRP=  <0.3 (from: 0.62)Was an Adverse Event Identified?  No Author's Role		APP (e.g. PA or APRN) Total Time Spent on this Encounter (in minutes)5 minutes

## 2023-02-21 ENCOUNTER — Telehealth: Payer: Self-pay | Admitting: *Deleted

## 2023-02-21 ENCOUNTER — Ambulatory Visit (INDEPENDENT_AMBULATORY_CARE_PROVIDER_SITE_OTHER): Payer: Medicare HMO | Admitting: Internal Medicine

## 2023-02-21 ENCOUNTER — Encounter: Payer: Self-pay | Admitting: *Deleted

## 2023-02-21 ENCOUNTER — Encounter: Payer: Self-pay | Admitting: Internal Medicine

## 2023-02-21 ENCOUNTER — Telehealth: Admit: 2023-02-21 | Payer: PRIVATE HEALTH INSURANCE | Primary: Family Medicine

## 2023-02-21 ENCOUNTER — Encounter: Admit: 2023-02-21 | Payer: PRIVATE HEALTH INSURANCE | Attending: Infectious Disease | Primary: Family Medicine

## 2023-02-21 VITALS — BP 112/60 | HR 62 | Temp 97.7°F | Ht 71.0 in | Wt 218.2 lb

## 2023-02-21 DIAGNOSIS — K766 Portal hypertension: Secondary | ICD-10-CM | POA: Diagnosis not present

## 2023-02-21 DIAGNOSIS — K7460 Unspecified cirrhosis of liver: Secondary | ICD-10-CM | POA: Diagnosis not present

## 2023-02-21 DIAGNOSIS — K7469 Other cirrhosis of liver: Secondary | ICD-10-CM | POA: Diagnosis not present

## 2023-02-21 NOTE — Telephone Encounter (Signed)
[employer] PA: Approved Authorization #[id number]  Tracking #[id number] Dates of service 03/29/2023 - 06/27/2023

## 2023-02-21 NOTE — Progress Notes (Unsigned)
Primary Care Physician:  Toma Deiters, MD Primary Gastroenterologist:  Dr. Jena Gauss  Pre-Procedure History & Physical: HPI:  Mike Moreno is a 77 y.o. male here for follow-up of cirrhosis.  Was seen here a month ago.  Worsening diarrhea on lactulose found to have C. difficile was treated with vancomycin.  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  Furosemide switched to Bumex arrangements were made for him to see the wound clinic which she is doing he followed up with Dr. Bradly Bienenstock.  Apparently no plans at this time to see the hematologist but this was previously recommended.  [place]  Propranolol was stopped.  No prior EGD.  He is up 12 pounds now from his prior weight when he was here last month.  Diuretic regimen includes Bumex 1 mg daily and spironolactone daily.  He is trying to adhere to a 2 g sodium diet.   Past Medical History:  Diagnosis Date   BPH (benign prostatic hyperplasia)    Cirrhosis (HCC)    HTN (hypertension)    Hyperlipidemia    Neuropathy     Past Surgical History:  Procedure Laterality Date   bilateral inguinal hernias     BIOPSY  12/07/2016   Procedure: BIOPSY;  Surgeon: Corbin Ade, MD;  [facility];  Service: Endoscopy;;  ileocecal valve   COLONOSCOPY  08/2011   According to [facility] notes, 2 tubular adenomatous colon polyps removed next surveillance colonoscopy in 5 years   COLONOSCOPY N/A 12/07/2016   Procedure: COLONOSCOPY;  Surgeon: Corbin Ade, MD;  Location: [facility];  Service: Endoscopy;  Laterality: N/A;  1030    COLONOSCOPY WITH PROPOFOL N/A 06/22/2022   Procedure: COLONOSCOPY WITH PROPOFOL;  Surgeon: Corbin Ade, MD;  Location: [facility];  Service:  Endoscopy;  Laterality: N/A;  1:45 pm, pt can't move transportation   HAND SURGERY     traumatic injury   POLYPECTOMY  12/07/2016   Procedure: POLYPECTOMY;  Surgeon: Corbin Ade, MD;  [facility];  Service: Endoscopy;;  colon   POLYPECTOMY  06/22/2022   Procedure: POLYPECTOMY;  Surgeon: Corbin Ade, MD;  [facility];  Service: Endoscopy;;   UMBILICAL HERNIA REPAIR      Prior to Admission medications   Medication Sig Start Date End Date Taking? Authorizing Provider  alfuzosin (UROXATRAL) 10 MG 24 hr tablet Take 10 mg by mouth daily. 04/16/22  Yes [provider]  Ascorbic Acid (VITAMIN C) 1000 MG tablet Take 1,000 mg by mouth daily.   Yes [provider]  CONSTULOSE 10 GM/15ML solution Take 10 g by mouth daily. 12/03/22  Yes [provider]  gabapentin (NEURONTIN) 100 MG capsule Take 100 mg by mouth at bedtime.   Yes [provider]  midodrine (PROAMATINE) 5 MG tablet Take 5 mg by mouth 3 (three) times daily. 05/28/22  Yes [provider]  nitroGLYCERIN (NITROSTAT) 0.4 MG SL tablet Place 0.4 mg under the tongue as needed for chest pain.   Yes [provider]  propranolol (INDERAL) 10 MG tablet Take 10 mg by mouth 2 (two) times daily. 05/23/22  Yes [provider]  sodium bicarbonate 650 MG tablet Take 650 mg  by mouth 2 (two) times daily. 05/10/22  Yes [provider]  spironolactone (ALDACTONE) 25 MG tablet Take 25 mg by mouth daily. 04/04/22  Yes [provider]    Allergies as of 02/21/2023   (No Known Allergies)    Family History  Problem Relation Age of Onset   Cirrhosis Brother    Colon cancer Neg Hx     Social History   Socioeconomic History   Marital status: Single    Spouse name: Not on file   Number of children: Not on file   Years of education: Not on file   Highest education level: Not on file  Occupational History   Not on file  Tobacco Use   Smoking  status: Former    Types: Cigarettes, Cigars   Smokeless tobacco: Never   Tobacco comments:    quit 2015  Vaping Use   Vaping status: Never Used  Substance and Sexual Activity   Alcohol use: No    Comment: some etoh 20 years ago   Drug use: No    Comment: not since 2005   Sexual activity: Not Currently  Other Topics Concern   Not on file  Social History Narrative   Not on file   Social Determinants of Health   Financial Resource Strain: Low Risk  (04/17/2021)   Received from [facility], [facility]   Overall Financial Resource Strain (CARDIA)    Difficulty of Paying Living Expenses: Not hard at all  Food Insecurity: Not on file  Transportation Needs: No Transportation Needs (04/21/2021)   Received from [facility], [facility]   PRAPARE - Transportation    Lack of Transportation (Medical): No    Lack of Transportation (Non-Medical): No  Physical Activity: Not on file  Stress: No Stress Concern Present (04/17/2021)   Received from [facility], [facility] of Occupational [facility] - Occupational Stress Questionnaire    Feeling of Stress : Not at all  Social Connections: Unknown (08/27/2021)   Received from [facility], [facility]   Social Network    Social Network: Not on file  Intimate Partner Violence: Unknown (07/30/2021)   Received from [facility], [facility]   HITS    Physically Hurt: Not on file    Insult or Talk Down To: Not on file    Threaten Physical Harm: Not on file    Scream or Curse: Not on file    Review of Systems: See HPI, otherwise negative ROS  Physical Exam: BP 112/60 (BP Location: Right Arm, Patient Position: Sitting, Cuff Size: Normal)   Pulse 62   Temp 97.7 F (36.5 C) (Oral)   Ht 5\' 11"  (1.803 m)   Wt 218 lb 3.2 oz (99 kg)   SpO2 98%   BMI 30.43 kg/m  General:   Alert,   pleasant and cooperative in NAD Lungs:  Clear throughout to auscultation.   No wheezes, crackles, or rhonchi. No acute  distress. Heart:  Regular rate and rhythm; no murmurs, clicks, rubs,  or gallops. Abdomen: Full.  Positive bowel sounds soft and nontender no obvious succussion splash.   Extremities: 2+ LE edema  Impression/Plan: 77 year old gentleman with cirrhosis.  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  He is on propranolol started elsewhere sometime ago but no prior EGD.  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 difficile infection because of diarrhea recently he was treated diarrhea has resolved.  Hospitalized with lower extremity edema and  weeping LE's in the [employer] last week.  Currently on Bumex and Aldactone seeing the wound specialist here.  Propranolol stopped for apparent bradycardia.  Recommendations:  Recommend EGD to assess whether or not esophageal varices are present.  The risks, benefits, limitations, alternatives and imponderables have been reviewed with the patient. Potential for esophageal dilation, biopsy, etc. have also been reviewed.  ASA 3.  Will pursue a hematology evaluation for possible SMV thrombosis.  Be met and CBC today  Information on 2 g sodium diet  Continue with therapy at the wound center  Office visit with Tana Coast in 2 months     Notice: This dictation was prepared with Dragon dictation along with smaller phrase technology. Any transcriptional errors that result from this process are unintentional and may not be corrected upon review.

## 2023-02-21 NOTE — Patient Instructions (Signed)
It was good to see you again today!  As discussed, you need a EGD to determine whether or not you have varicose veins in your esophagus.  We will schedule that in the near future.  ASA 3  You need to go to the hematologist or blood specialist to further evaluate possible superior mesenteric vein thrombosis.  We will refer.  Be met and CBC today  Information on 2 g sodium diet  Continue with therapy at the wound center  Office visit with Tana Coast in 2 months

## 2023-02-21 NOTE — Progress Notes
 MRI from 02/20/23 noted. FINDINGS:Contrast-enhancing involving the lower L5 and entire S1 vertebral bodies slightly improved since prior study. L5-S1 intervertebral disc hyperintensity and thinning with mild enhancement slightly improved since prior. Previously noted superior S1 subarticular hypointensity has improved since prior.Enhancing signal abnormality of bilateral L5-S1 facet joint osseous components, joint spaces, and periarticular regions are again noted.Minimally improved enhancing infiltration of bilateral L5-S1 neural foramina represent. Broad midline prevertebral nonenhancing collection consistent with abscess at the L5-S1 disc level which extends inferiorly nearly to the mid S1 level measures 15 x 14 mm on sagittal plane, previously 16 x 6 mm minimally improved. Of note, there is loss of patent signal void within the inferior vena cava and bilateral common iliac veins. Prevertebral edema signal extending superiorly to the L4-L5 level and inferiorly to the S2 level minimally improved. Solid enhancing 6 mm thickness circumferential L5-S1 epidural phlegmon enhancement and right S1 lateral recess phlegmon is slightly improving. Thin elongated 11 x 2 mm residual epidural abscess collection is seen posterolateral to the thecal sac on the right at the L5-S1 disc level not significantly changed. Minimal L4-5 degenerative malalignment and bilateral facet joint and ligamentum flavum degenerative hypertrophy result in unchanged severe L4-L5 canal stenosis. No L3-L4 disc protrusion or stenosis seen. L2-L3 and L1-L2 retrolistheses and disc bulging are unchanged. Superior L3 endplate enhancing 10 mm osteochondral Schmorl's node is unchanged. Severe right iliopsoas muscle fatty atrophy is unchanged. Perinephric fat stranding. Bilateral renal cysts. IMPRESSION:Minimally improving L5-S1 discovertebral/facet joint narrowing and periarticular enhancement.Minimally improving epidural phlegmon including focal right L5-S1 enhancement.Loss of patent signal void within the adjacent prevertebral inferior vena cava and bilateral common iliac veins. Color Doppler ultrasound can be performed to evaluate for potential (partial) thrombosis.Not significantly changed  perivertebral edema   [facility] Radiology Notify System Classification: Urgent with trackable imaging recommendation. Report initiated by:  Greta Doom, MDComponentRef Range & Units 02/20/23 12:15 PM 02/13/23  1:15 PM 02/06/23  2:45 PM 01/30/23 12:05 PM C-Reactive Protein<0.30 mg/dL <4.09 8.11 High  <9.14 <0.30 A/P Empiric treatment with 6  weeks IV vancomycin, ceftriaxone and flagyl  (ended on 01/18/23 but continued till  repeat MRI 01/23/23) then switched to oritavancin/ciprofloxacin. Has been on antibiotics since 12/07/22 (10 weeks plus 5 days)02/20/23 CRP wnlAntibiotics have not made much difference. Was unable to do biopsy during initial admission to hospital.Will d/c antibiotics. Watch clinically- OPAT team to discuss ER precautions - monitor for fevers/new symptoms and immediately seek care.Will d/w Dr.  Newton Pigg. F/u with me as planned. Plan conveyed to [facility].

## 2023-02-21 NOTE — Telephone Encounter
 [facility] Documentation NoteALL OTHER PHONE CALLS (for other adverse events, missing labs, etc)   OPAT Synopsis can be found Here Reason for CallOTHER                                   Other Issue: spoke with Marcelino Duster at [facility].  Confirmed pt has completed course of oritavancin and PICC can be pulled per Dr. Noralee Chars.	Antibiotics Implicated (select all that apply) : Not Applicable Affiliated Agency: [facility] Author's Role		RN Total Time Spent on this Encounter (in minutes)10 minutes

## 2023-02-22 ENCOUNTER — Encounter: Payer: Self-pay | Admitting: Oncology

## 2023-02-22 ENCOUNTER — Inpatient Hospital Stay: Payer: Medicare HMO

## 2023-02-22 ENCOUNTER — Inpatient Hospital Stay: Payer: Medicare HMO | Attending: Oncology | Admitting: Oncology

## 2023-02-22 VITALS — BP 132/58 | HR 51 | Temp 97.8°F | Resp 16 | Ht 71.0 in | Wt 214.5 lb

## 2023-02-22 DIAGNOSIS — K7460 Unspecified cirrhosis of liver: Secondary | ICD-10-CM | POA: Diagnosis not present

## 2023-02-22 DIAGNOSIS — Z87891 Personal history of nicotine dependence: Secondary | ICD-10-CM | POA: Insufficient documentation

## 2023-02-22 DIAGNOSIS — D649 Anemia, unspecified: Secondary | ICD-10-CM | POA: Diagnosis not present

## 2023-02-22 DIAGNOSIS — K55059 Acute (reversible) ischemia of intestine, part and extent unspecified: Secondary | ICD-10-CM | POA: Insufficient documentation

## 2023-02-22 DIAGNOSIS — D61818 Other pancytopenia: Secondary | ICD-10-CM | POA: Insufficient documentation

## 2023-02-22 DIAGNOSIS — D500 Iron deficiency anemia secondary to blood loss (chronic): Secondary | ICD-10-CM

## 2023-02-22 DIAGNOSIS — D696 Thrombocytopenia, unspecified: Secondary | ICD-10-CM | POA: Diagnosis not present

## 2023-02-22 DIAGNOSIS — Z79899 Other long term (current) drug therapy: Secondary | ICD-10-CM | POA: Insufficient documentation

## 2023-02-22 DIAGNOSIS — K55069 Acute infarction of intestine, part and extent unspecified: Secondary | ICD-10-CM | POA: Insufficient documentation

## 2023-02-22 LAB — CBC WITH DIFFERENTIAL/PLATELET
Basophils Absolute: 0 10*3/uL (ref 0.0–0.2)
Basos: 0 %
EOS (ABSOLUTE): 0.2 10*3/uL (ref 0.0–0.4)
Eos: 4 %
Hematocrit: 29.5 % — ABNORMAL LOW (ref 37.5–51.0)
Hemoglobin: 9.7 g/dL — ABNORMAL LOW (ref 13.0–17.7)
Immature Grans (Abs): 0 10*3/uL (ref 0.0–0.1)
Immature Granulocytes: 0 %
Lymphocytes Absolute: 0.9 10*3/uL (ref 0.7–3.1)
Lymphs: 22 %
MCH: 36.2 pg — ABNORMAL HIGH (ref 26.6–33.0)
MCHC: 32.9 g/dL (ref 31.5–35.7)
MCV: 110 fL — ABNORMAL HIGH (ref 79–97)
Monocytes Absolute: 0.7 10*3/uL (ref 0.1–0.9)
Monocytes: 18 %
Neutrophils Absolute: 2.3 10*3/uL (ref 1.4–7.0)
Neutrophils: 56 %
Platelets: 50 10*3/uL — CL (ref 150–450)
RBC: 2.68 x10E6/uL — CL (ref 4.14–5.80)
RDW: 13 % (ref 11.6–15.4)
WBC: 4.1 10*3/uL (ref 3.4–10.8)

## 2023-02-22 LAB — IRON AND TIBC
Iron: 149 ug/dL (ref 45–182)
Saturation Ratios: 70 % — ABNORMAL HIGH (ref 17.9–39.5)
TIBC: 214 ug/dL — ABNORMAL LOW (ref 250–450)
UIBC: 65 ug/dL

## 2023-02-22 LAB — BASIC METABOLIC PANEL
BUN/Creatinine Ratio: 18 (ref 10–24)
BUN: 36 mg/dL — ABNORMAL HIGH (ref 8–27)
CO2: 23 mmol/L (ref 20–29)
Calcium: 8.5 mg/dL — ABNORMAL LOW (ref 8.6–10.2)
Chloride: 106 mmol/L (ref 96–106)
Creatinine, Ser: 1.96 mg/dL — ABNORMAL HIGH (ref 0.76–1.27)
Glucose: 123 mg/dL — ABNORMAL HIGH (ref 70–99)
Potassium: 4 mmol/L (ref 3.5–5.2)
Sodium: 141 mmol/L (ref 134–144)
eGFR: 35 mL/min/{1.73_m2} — ABNORMAL LOW (ref >59)

## 2023-02-22 LAB — FOLATE: Folate: 13.5 ng/mL (ref >5.9)

## 2023-02-22 LAB — VITAMIN B12: Vitamin B-12: 1165 pg/mL — ABNORMAL HIGH (ref 180–914)

## 2023-02-22 LAB — FERRITIN: Ferritin: 407 ng/mL — ABNORMAL HIGH (ref 24–336)

## 2023-02-22 MED ORDER — FERROUS SULFATE 325 (65 FE) MG PO TBEC: 325.0000 mg | DELAYED_RELEASE_TABLET | ORAL | 3 refills | Status: DC

## 2023-02-22 NOTE — Progress Notes (Signed)
[facility] at [facility] HEMATOLOGY NEW VISIT  Toma Deiters, MD  REASON FOR REFERRAL: SMV thrombosis  HISTORY OF PRESENT ILLNESS: Mike Moreno 77 y.o. 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  He has a past medical history of liver cirrhosis.  Patient was referred for SMV thrombosis that was found incidentally on a CT scan done for rectal bleeding.  He denies abdominal pain.  Patient was also recently diagnosed C. difficile diarrhea and was treated with antibiotics.The patient reports occasional fatigue and has noticed easy bruising. He also reports swelling in his legs, which has previously resulted in fluid leakage through the skin. The patient denies any current rectal bleeding, vomiting of blood, or shortness of breath.  The patient's liver cirrhosis was diagnosed last year, but the cause remains unclear. The patient admits to past alcohol consumption, but not to the extent of his late brother who also had liver cirrhosis and passed away at the age of 31. The patient quit drinking and smoking in 1982. 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  Quit smoking in 1982.   I have reviewed the past medical history, past surgical history, social history and family history with the patient   ALLERGIES:  has No Known Allergies.  MEDICATIONS:  Current Outpatient Medications  Medication Sig Dispense Refill   alfuzosin (UROXATRAL) 10 MG 24 hr tablet Take 10 mg by mouth daily.     bumetanide (BUMEX) 1 MG tablet Take 1 mg by mouth daily.     ferrous sulfate 325 (65 FE) MG EC tablet Take 1 tablet (325 mg total) by mouth every other day. 45 tablet 3   gabapentin (NEURONTIN) 100 MG capsule Take 100 mg by mouth at bedtime.     midodrine (PROAMATINE) 5 MG tablet Take 5 mg by mouth 3 (three) times daily.     propranolol (INDERAL) 10 MG tablet Take 10 mg by mouth 2 (two) times daily.     sodium bicarbonate 650 MG tablet Take 650 mg by mouth 2 (two)  times daily.     spironolactone (ALDACTONE) 25 MG tablet Take 25 mg by mouth daily.     SSD 1 % cream Apply topically.     nitroGLYCERIN (NITROSTAT) 0.4 MG SL tablet Place 0.4 mg under the tongue as needed for chest pain. (Patient not taking: Reported on 02/22/2023)     No current facility-administered medications for this visit.     REVIEW OF SYSTEMS:   Constitutional: Denies fevers, chills or night sweats Eyes: Denies blurriness of vision Ears, nose, mouth, throat, and face: Denies mucositis or sore throat Respiratory: Denies cough, dyspnea or wheezes Cardiovascular: Denies palpitation, chest discomfort or lower extremity swelling Gastrointestinal:  Denies nausea, heartburn or change in bowel habits Skin: Denies abnormal skin rashes Lymphatics: Denies new lymphadenopathy or easy bruising Neurological:Denies numbness, tingling or new weaknesses Behavioral/Psych: Mood is stable, no new changes  All other systems were reviewed with the patient and are negative.  PHYSICAL EXAMINATION:   Vitals:   02/22/23 1058  BP: (!) 132/58  Pulse: (!) 51  Resp: 16  Temp: 97.8 F (36.6 C)  SpO2: 97%    GENERAL:alert, no distress and comfortable LYMPH:  no palpable lymphadenopathy in the cervical, axillary or inguinal LUNGS: clear to auscultation and percussion with normal breathing effort HEART: regular rate & rhythm and no murmurs and bilateral lower extremity edema extending up to the thigh ABDOMEN: Distended abdomen, visible vessels suggestive of cirrhosis-related changes. No ascites.  Musculoskeletal:no cyanosis of digits and no clubbing  NEURO: alert & oriented x 3 with fluent speech   LABORATORY DATA:  I have reviewed the data as listed  Lab Results  Component Value Date   WBC 4.1 02/21/2023   NEUTROABS 2.3 02/21/2023   HGB 9.7 (L) 02/21/2023   HCT 29.5 (L) 02/21/2023   MCV 110 (H) 02/21/2023   PLT 50 (LL) 02/21/2023      Component Value Date/Time   NA 141 02/21/2023 1057    K 4.0 02/21/2023 1057   CL 106 02/21/2023 1057   CO2 23 02/21/2023 1057   GLUCOSE 123 (H) 02/21/2023 1057   GLUCOSE 118 (H) 01/12/2023 1418   BUN 36 (H) 02/21/2023 1057   CREATININE 1.96 (H) 02/21/2023 1057   CALCIUM 8.5 (L) 02/21/2023 1057   PROT 5.9 (L) 01/30/2023 1339   ALBUMIN 3.2 (L) 01/30/2023 1339   AST 64 (H) 01/30/2023 1339   ALT 41 01/30/2023 1339   ALKPHOS 108 01/30/2023 1339   BILITOT 2.5 (H) 01/30/2023 1339   GFRNONAA 36 (L) 01/12/2023 1418       Chemistry      Component Value Date/Time   NA 141 02/21/2023 1057   K 4.0 02/21/2023 1057   CL 106 02/21/2023 1057   CO2 23 02/21/2023 1057   BUN 36 (H) 02/21/2023 1057   CREATININE 1.96 (H) 02/21/2023 1057      Component Value Date/Time   CALCIUM 8.5 (L) 02/21/2023 1057   ALKPHOS 108 01/30/2023 1339   AST 64 (H) 01/30/2023 1339   ALT 41 01/30/2023 1339   BILITOT 2.5 (H) 01/30/2023 1339       RADIOGRAPHIC STUDIES:  CT AP: 12/27/22: Impression  1. Hepatic cirrhosis with upper abdominal and periumbilical vein varices. Small amount of abdominal and pelvic ascites. 2. Focal filling defect in the origin of the superior mesenteric vein with flow on both sides suggesting nonocclusive thrombus. 3. No evidence of bowel obstruction or inflammation. 4. Aortic atherosclerosis. 5. Prostate gland is enlarged. 6. Nonobstructing stone in the right kidney.  ASSESSMENT & PLAN:  Patient is a 77 year old male referred for SMV thrombosis   Superior mesenteric vein thrombosis (HCC) Small clot identified in the SMV on recent CT scan. Likely secondary to underlying cirrhosis. Given low platelet count and recent history of rectal bleeding, the risk of anticoagulation with bleeding outweighs the benefit. -No anticoagulation at this time due to risk of bleeding.    Return to clinic in 1 month for follow-up  Anemia Low hemoglobin likely secondary to recent rectal bleeding and possible iron deficiency. -Order labs to confirm  iron deficiency. -If iron deficiency confirmed and ferritin <400, will administer IV iron. -Prescribed oral iron supplementation, to be taken every other day.  Hepatic cirrhosis (HCC) Etiology unclear, possibly related to past alcohol use. Associated with low platelets, anemia, and SMV thrombosis. -Reach out to Dr. Jena Gauss for further information on etiology of cirrhosis. -Continue current management as directed by Dr. Jena Gauss.  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  Arrange: 45 -70.  Reports some episodes of rectal bleeding. -No requirement for transfusion at this time.  Will monitor for now   Orders Placed This Encounter  Procedures   Ferritin    Standing Status:   Future    Number of Occurrences:   1    Standing Expiration Date:   02/22/2024   Iron and TIBC    Standing Status:   Future    Number of Occurrences:  1    Standing Expiration Date:   02/22/2024   Vitamin B12    Standing Status:   Future    Number of Occurrences:   1    Standing Expiration Date:   02/22/2024   Folate    Standing Status:   Future    Number of Occurrences:   1    Standing Expiration Date:   02/22/2024    The total time spent in the appointment was 30 minutes encounter with patients including review of chart and various tests results, discussions about plan of care and coordination of care plan   All questions were answered. The patient knows to call the clinic with any problems, questions or concerns. No barriers to learning was detected.   Cindie Crumbly, MD 10/30/20241:04 PM

## 2023-02-22 NOTE — Assessment & Plan Note (Signed)
Low hemoglobin likely secondary to recent rectal bleeding and possible iron deficiency. -Order labs to confirm iron deficiency. -If iron deficiency confirmed and ferritin <400, will administer IV iron. -Prescribed oral iron supplementation, to be taken every other day.

## 2023-02-22 NOTE — Assessment & Plan Note (Addendum)
Small clot identified in the SMV on recent CT scan. Likely secondary to underlying cirrhosis. Given low platelet count and recent history of rectal bleeding, the risk of anticoagulation with bleeding outweighs the benefit. -No anticoagulation at this time due to risk of bleeding.    Return to clinic in 1 month for follow-up

## 2023-02-22 NOTE — Assessment & Plan Note (Signed)
Etiology unclear, possibly related to past alcohol use. Associated with low platelets, anemia, and SMV thrombosis. -Reach out to Dr. Jena Gauss for further information on etiology of cirrhosis. -Continue current management as directed by Dr. Jena Gauss.

## 2023-02-22 NOTE — Patient Instructions (Signed)
VISIT SUMMARY:  Mike Moreno, during your visit today, we discussed the recent findings from your CT scan and addressed your ongoing health concerns. We reviewed your history of liver cirrhosis, recent rectal bleeding, and a recent infection. We also discussed your symptoms of fatigue, easy bruising, and leg swelling.  YOUR PLAN:  -SUPERIOR MESENTERIC VEIN THROMBOSIS: A small clot was found in your SMV, likely due to your liver cirrhosis. Because of your low platelet count and recent rectal bleeding, we decided not to start blood thinners at this time to avoid the risk of further bleeding.  -IRON DEFICIENCY ANEMIA: You have low hemoglobin, which is likely due to recent rectal bleeding and possibly low iron levels. We will order labs to confirm if you have iron deficiency. If confirmed and your ferritin level is below 400, we will give you iron through an IV. In the meantime, you will start taking oral iron supplements every other day.  -CIRRHOSIS: Your liver cirrhosis, which might be related to past alcohol use, is associated with low platelets, anemia, and the clot in your portal vein. We will contact Dr. Jena Gauss to get more information about the cause of your cirrhosis and continue with the current management plan as directed by him.  -GENERAL HEALTH MAINTENANCE: We recommend maintaining a healthy diet that includes green leafy vegetables and red meat to help with your iron levels. You might also consider seeing a nutritionist for more dietary advice. We will follow up with you after your lab results to discuss the possibility of scheduling an IV iron infusion.  INSTRUCTIONS:  Please follow up after your lab results are available so we can discuss the next steps, including the potential scheduling of an IV iron infusion.

## 2023-02-22 NOTE — Assessment & Plan Note (Signed)
Likely secondary to liver cirrhosis.  Arrange: 45 -70.  Reports some episodes of rectal bleeding. -No requirement for transfusion at this time.  Will monitor for now

## 2023-02-23 ENCOUNTER — Other Ambulatory Visit: Payer: Self-pay | Admitting: *Deleted

## 2023-02-23 ENCOUNTER — Ambulatory Visit: Admit: 2023-02-23 | Payer: PRIVATE HEALTH INSURANCE | Attending: Infectious Disease | Primary: Family Medicine

## 2023-02-23 ENCOUNTER — Telehealth: Admit: 2023-02-23 | Payer: PRIVATE HEALTH INSURANCE | Attending: Infectious Disease | Primary: Family Medicine

## 2023-02-24 ENCOUNTER — Other Ambulatory Visit: Payer: Self-pay

## 2023-02-24 DIAGNOSIS — M4620 Osteomyelitis of vertebra, site unspecified: Secondary | ICD-10-CM

## 2023-02-24 DIAGNOSIS — K7460 Unspecified cirrhosis of liver: Secondary | ICD-10-CM

## 2023-02-24 NOTE — Progress Notes
 VIDEO TELEHEALTH VISIT: This clinician is part of the telehealth program and is conducting this visit in a currently approved location. For this visit the clinician and patient were present via interactive audio & video telecommunications system that permits real-time communications, via the [employer].Patient's use of the telehealth platform followed consent and acknowledges agreement to permit telehealth for this visit. State patient is located in: [place] clinician is appropriately licensed in the above state to provide care for this visit. Other individuals present during the telehealth encounter and their role/relation: spouseBecause this visit was completed over video, a hands-on physical exam was not performed. Patient/parent or guardian understands and knows to call back if condition changes. [facility]: General Infectious Diseases [address] [place], [place] [address](U) [phone] Medical Director: Heide Spark, MDDirector: Gerome Sam, MD, AAHIVSThilinie D. Truro, MBBS Vallery Ridge, MD, PhD Shawnee Knapp, MD Tedd Sias, MD Jerel Shepherd, MD Mickel Fuchs, [place] Faylene Million, MD Sherian Rein, MD Jimmye Norman, [place] Albertina Parr, MD Loretha Stapler, MD, PhD Chancy Milroy, [place] Nani Skillern, MD, BS, AAHIVS Lianne Moris, MD, PhD  Infectious Disease Outpatient Follow Up VisitPatient Name:  Carlos Caldwell of Birth:  [birth year]	EPIC MRN:  [id number] Date of Service: 10/31/2024History obtained from:  chart review, the patient, and spouseHistory of Present IllnessWilliam Caldwell  is a 77 y.o. male  with past medical history significant for  b/l hip replacements, kidney stone, hyperlipidemia, remote h/o basal cell carcinoma, peripheral neuropathy, who presented on 08/14 with lower back pain for 2 weeks. Found to have L5-S1 septic facet joint arthritis, L5-S1 diskitis, L4 to S2 prominent circumferential epidural phlegmon and small septated abscesses. During the hospitalization he was evaluated by IR for  possible aspiration fo phlegmon or abscess areas; but collections were too small to tap. TTE without evidence of vegetations. IR re-evaluated and were unable to find area to biopsy vertebral body. Therefore,  empirically treated with 6 weeks of vancomycin/CTX and metronidazole for epidural abscess/ OM. MRI on 01/23/23, showed Progression of L5-S1 discovertebral/facet joint marrow and periarticular enhancement since 12/08/2022. Epidural phlegmon including focal right L5-S1 enhancement is largely unchanged. Residual right posterolateral epidural abscess represents improvement of epidural abscesses. Progression prevertebral edema with new S1 prevertebral abscess. He was evaluated by neurosurgery (Dr. Newton Pigg). We switched antibiotics to Oritavancin/ciprofloxacin as patient insisted on going home from STR and stopped metronidazole. He has completed last dose of Oritavancin on 02/20/23 and is still taking ciprofloxacin- last dose on 02/24/23.Today's visit  The patient, with a history of vertebral infection, has been on a prolonged course of antibiotics for approximately 11 weeks. The patient reports significant improvement in symptoms, including a reduction in pain and increased mobility. The patient has transitioned from using a walker to a cane for mobility and reports minimal to no pain. The patient's spouse reports that the patient has been able to resume some daily activities, such as standing and walking without assistance. The patient has also completed a course of physical therapy. The patient and spouse report that the patient's pain has substantially subsided and that the patient's balance has improved, although not yet fully restored. The patient reports only minor pain when bending over or standing up straight after a period of inactivity. The patient has also recently received a COVID-19 vaccine and a flu vaccine.   Past Medical History: Diagnosis Date  BCC (basal cell carcinoma), ear, right   Patent foramen ovale   Peripheral neuropathy  Past Surgical History: Procedure Laterality Date  COLONOSCOPY  07/2014  JOINT REPLACEMENT    b/l hips  ORTHOPEDIC SURGERY    ROTATOR CUFF REPAIR Bilateral   TOTAL HIP ARTHROPLASTY  2000  TOTAL HIP ARTHROPLASTY  2003  VASCULAR SURGERY    L carotid endarterectomy family history includes Heart failure in his father. reports that he has never smoked. He has never used smokeless tobacco. He reports that he does not drink alcohol and does not use drugs.ALLERGIESNo Known Allergies	Physical ExamThere were no vitals filed for this visit.Video visitLABORATORY DATALab Results Component Value Date  WBC 5.5 02/20/2023  HGB 14.7 02/20/2023  HCT 45.40 02/20/2023  MCV 93.2 02/20/2023  PLT 166 02/20/2023 Lab Results Component Value Date  CREATININE 0.80 02/20/2023  BUN 18 02/20/2023  NA 138 02/20/2023  K 4.2 02/20/2023  CL 106 02/20/2023  CO2 28 02/20/2023 Estimated Creatinine Clearance: 86 mL/min (by C-G formula based on SCr of 0.8 mg/dL).Lab Results Component Value Date  ALT 52 02/20/2023  AST 31 02/20/2023  ALKPHOS 76 02/20/2023  BILITOT 0.6 02/20/2023 CRP: <0.3 mg/L (02/20/2023)CRP: 0.6 mg/L (02/13/2023)MicrobiologyNo new dataRadiology10/28/24 MRI L spineFINDINGS:Contrast-enhancing involving the lower L5 and entire S1 vertebral bodies slightly improved since prior study. L5-S1 intervertebral disc hyperintensity and thinning with mild enhancement slightly improved since prior. Previously noted superior S1 subarticular hypointensity has improved since prior.Enhancing signal abnormality of bilateral L5-S1 facet joint osseous components, joint spaces, and periarticular regions are again noted.Minimally improved enhancing infiltration of bilateral L5-S1 neural foramina represent. Broad midline prevertebral nonenhancing collection consistent with abscess at the L5-S1 disc level which extends inferiorly nearly to the mid S1 level measures 15 x 14 mm on sagittal plane, previously 16 x 6 mm minimally improved. Of note, there is loss of patent signal void within the inferior vena cava and bilateral common iliac veins. Prevertebral edema signal extending superiorly to the L4-L5 level and inferiorly to the S2 level minimally improved. Solid enhancing 6 mm thickness circumferential L5-S1 epidural phlegmon enhancement and right S1 lateral recess phlegmon is slightly improving. Thin elongated 11 x 2 mm residual epidural abscess collection is seen posterolateral to the thecal sac on the right at the L5-S1 disc level not significantly changed. Minimal L4-5 degenerative malalignment and bilateral facet joint and ligamentum flavum degenerative hypertrophy result in unchanged severe L4-L5 canal stenosis. No L3-L4 disc protrusion or stenosis seen. L2-L3 and L1-L2 retrolistheses and disc bulging are unchanged. Superior L3 endplate enhancing 10 mm osteochondral Schmorl's node is unchanged. Severe right iliopsoas muscle fatty atrophy is unchanged. Perinephric fat stranding. Bilateral renal cysts. IMPRESSION:Minimally improving L5-S1 discovertebral/facet joint narrowing and periarticular enhancement.Minimally improving epidural phlegmon including focal right L5-S1 enhancement.Loss of patent signal void within the adjacent prevertebral inferior vena cava and bilateral common iliac veins. Color Doppler ultrasound can be performed to evaluate for potential (partial) thrombosis.Not significantly changed  perivertebral edema AssessmentPatient is a 77 y.o. male with past medical history significant for b/l hip replacements, kidney stone, hyperlipidemia, remote h/o basal cell carcinoma, peripheral neuropathy, who presented on 12/07/22 with lower back pain for 2 weeks. Found to have L5-S1 septic facet joint arthritis, L5-S1 diskitis, L4 to S2 prominent circumferential epidural phlegmon and small septated abscesses.  TTE no vegetations, BC NG. IR Attempted/aspiration, bone biopsy, but was unable to do so. Syndrome: Lumbar spine epidural abscess/OM. Planned for empiric treatment with 6  weeks IV vancomycin, ceftriaxone and flagyl  (ended on 01/18/23 but continued till  repeat MRI). MRI showed Progression of L5-S1 discovertebral/facet joint marrow and periarticular enhancement since 12/08/2022. Epidural phlegmon including focal right L5-S1 enhancement  is largely unchanged. Residual right posterolateral epidural abscess represents improvement of epidural abscesses. Progression prevertebral edema with new S1 prevertebral abscess.Antibiotics were switched to weekly oritavancin (01/30/23- completed 4 doses on 02/20/23) as patient insisted on going home from STR and PO ciprofloxacin; flagy was stopped. After d/w Dr. Newton Pigg, biopsy was deferred. He had repeat MRI on 02/20/23.In summary, he has had empiric treatment with 6  weeks IV vancomycin, ceftriaxone and flagyl  (ended on 01/18/23 but continued till  repeat MRI 01/23/23) then switched to oritavancin/ciprofloxacin. Has been on antibiotics since 12/07/22 (11 weeks) 02/20/23 CRP wnl Antibiotics have not made much difference in MRI. However he reports clinical improvement. Back pain is minimal and now more ambulatory, able to walk with cane instead of walker.  Was unable to do biopsy during initial admission to hospital. Recommendations  Vertebral InfectionClinical improvement noted with decreased pain and increased mobility. Patient has been on antibiotics for approximately 11 weeks. Recent MRI shows slight improvement in abscesses. Inflammatory markers have remained stable.-Discontinue Ciprofloxacin after the last dose on 02/24/2023.-Observe for any worsening of symptoms off antibiotics.-Consult with Dr. Newton Pigg regarding MRI findings and potential need for further antibiotics.-Schedule follow-up in 4 weeks to assess continued improvement.General Health Maintenance-Continue physical therapy.-Practice good hand hygiene and mask usage during upcoming travel to reduce risk of infection.   Plan discussed with patient and wife. All questions answered. Follow up visit with me in 1 month. Addendum: I spoke with Dr. Newton Pigg, updated him on the antibiotic plan. He will review the MRI during patient visit on 03/01/23.[employer] was used for part of this note and patient and wife gave permission. Jahlani Lorentz, M.B.B.S.Assistant Professor of Medicine (Infectious Diseases)Section of Infectious DiseasesDepartment of Internal MedicineYale UAL Corporation of Medicine

## 2023-02-27 DIAGNOSIS — K5902 Outlet dysfunction constipation: Secondary | ICD-10-CM | POA: Diagnosis not present

## 2023-02-27 DIAGNOSIS — G621 Alcoholic polyneuropathy: Secondary | ICD-10-CM | POA: Diagnosis not present

## 2023-02-27 DIAGNOSIS — Z6831 Body mass index (BMI) 31.0-31.9, adult: Secondary | ICD-10-CM | POA: Diagnosis not present

## 2023-02-27 DIAGNOSIS — I5032 Chronic diastolic (congestive) heart failure: Secondary | ICD-10-CM | POA: Diagnosis not present

## 2023-02-27 DIAGNOSIS — K7031 Alcoholic cirrhosis of liver with ascites: Secondary | ICD-10-CM | POA: Diagnosis not present

## 2023-02-27 DIAGNOSIS — N1831 Chronic kidney disease, stage 3a: Secondary | ICD-10-CM | POA: Diagnosis not present

## 2023-02-27 DIAGNOSIS — N400 Enlarged prostate without lower urinary tract symptoms: Secondary | ICD-10-CM | POA: Diagnosis not present

## 2023-03-01 ENCOUNTER — Encounter
Admit: 2023-03-01 | Payer: PRIVATE HEALTH INSURANCE | Attending: Vascular and Interventional Radiology | Primary: Family Medicine

## 2023-03-01 ENCOUNTER — Ambulatory Visit: Admit: 2023-03-01 | Payer: PRIVATE HEALTH INSURANCE | Attending: Neurological Surgery | Primary: Family Medicine

## 2023-03-07 ENCOUNTER — Encounter: Admit: 2023-03-07 | Payer: PRIVATE HEALTH INSURANCE | Attending: Neurological Surgery | Primary: Family Medicine

## 2023-03-07 ENCOUNTER — Ambulatory Visit: Admit: 2023-03-07 | Payer: PRIVATE HEALTH INSURANCE | Attending: Neurological Surgery | Primary: Family Medicine

## 2023-03-07 VITALS — Ht 72.0 in | Wt 215.0 lb

## 2023-03-07 DIAGNOSIS — G629 Polyneuropathy, unspecified: Secondary | ICD-10-CM

## 2023-03-07 DIAGNOSIS — C44212 Basal cell carcinoma of skin of right ear and external auricular canal: Secondary | ICD-10-CM

## 2023-03-07 DIAGNOSIS — G062 Extradural and subdural abscess, unspecified: Secondary | ICD-10-CM

## 2023-03-07 DIAGNOSIS — Q2112 Patent foramen ovale: Secondary | ICD-10-CM

## 2023-03-07 DIAGNOSIS — K7460 Unspecified cirrhosis of liver: Secondary | ICD-10-CM | POA: Diagnosis not present

## 2023-03-08 LAB — PROTIME-INR
INR: 1.4 — ABNORMAL HIGH (ref 0.9–1.2)
Prothrombin Time: 15.4 s — ABNORMAL HIGH (ref 9.1–12.0)

## 2023-03-08 LAB — COMPREHENSIVE METABOLIC PANEL
ALT: 42 [IU]/L (ref 0–44)
AST: 86 [IU]/L — ABNORMAL HIGH (ref 0–40)
Albumin: 3 g/dL — ABNORMAL LOW (ref 3.8–4.8)
Alkaline Phosphatase: 128 [IU]/L — ABNORMAL HIGH (ref 44–121)
BUN/Creatinine Ratio: 18 (ref 10–24)
BUN: 29 mg/dL — ABNORMAL HIGH (ref 8–27)
Bilirubin Total: 3.2 mg/dL — ABNORMAL HIGH (ref 0.0–1.2)
CO2: 22 mmol/L (ref 20–29)
Calcium: 8.5 mg/dL — ABNORMAL LOW (ref 8.6–10.2)
Chloride: 107 mmol/L — ABNORMAL HIGH (ref 96–106)
Creatinine, Ser: 1.6 mg/dL — ABNORMAL HIGH (ref 0.76–1.27)
Globulin, Total: 2.4 g/dL (ref 1.5–4.5)
Glucose: 91 mg/dL (ref 70–99)
Potassium: 3.9 mmol/L (ref 3.5–5.2)
Sodium: 142 mmol/L (ref 134–144)
Total Protein: 5.4 g/dL — ABNORMAL LOW (ref 6.0–8.5)
eGFR: 44 mL/min/{1.73_m2} — ABNORMAL LOW (ref >59)

## 2023-03-10 ENCOUNTER — Other Ambulatory Visit: Payer: Self-pay

## 2023-03-10 DIAGNOSIS — K7460 Unspecified cirrhosis of liver: Secondary | ICD-10-CM

## 2023-03-13 ENCOUNTER — Other Ambulatory Visit: Payer: Self-pay

## 2023-03-20 NOTE — Progress Notes (Signed)
Lab not due to have done yet.

## 2023-03-21 ENCOUNTER — Inpatient Hospital Stay: Admit: 2023-03-21 | Discharge: 2023-03-21 | Payer: PRIVATE HEALTH INSURANCE | Primary: Family Medicine

## 2023-03-21 DIAGNOSIS — G062 Extradural and subdural abscess, unspecified: Principal | ICD-10-CM

## 2023-03-22 LAB — CBC WITH AUTO DIFFERENTIAL
BKR WAM ABSOLUTE IMMATURE GRANULOCYTES.: 0.01 x 1000/ÂµL (ref 0.00–0.30)
BKR WAM ABSOLUTE LYMPHOCYTE COUNT.: 0.98 x 1000/ÂµL (ref 0.60–3.70)
BKR WAM ABSOLUTE NRBC (2 DEC): 0 x 1000/ÂµL (ref 0.00–1.00)
BKR WAM ANC (ABSOLUTE NEUTROPHIL COUNT): 3.14 x 1000/ÂµL (ref 2.00–7.60)
BKR WAM BASOPHIL ABSOLUTE COUNT.: 0.04 x 1000/ÂµL (ref 0.00–1.00)
BKR WAM BASOPHILS: 0.8 % (ref 0.0–1.4)
BKR WAM EOSINOPHIL ABSOLUTE COUNT.: 0.16 x 1000/ÂµL (ref 0.00–1.00)
BKR WAM EOSINOPHILS: 3.3 % (ref 0.0–5.0)
BKR WAM HEMATOCRIT (2 DEC): 49.5 % (ref 38.50–50.00)
BKR WAM HEMOGLOBIN: 16.7 g/dL (ref 13.2–17.1)
BKR WAM IMMATURE GRANULOCYTES: 0.2 % (ref 0.0–1.0)
BKR WAM LYMPHOCYTES: 20.2 % (ref 17.0–50.0)
BKR WAM MCH (PG): 31.5 pg (ref 27.0–33.0)
BKR WAM MCHC: 33.7 g/dL (ref 31.0–36.0)
BKR WAM MCV: 93.4 fL (ref 80.0–100.0)
BKR WAM MONOCYTE ABSOLUTE COUNT.: 0.53 x 1000/ÂµL (ref 0.00–1.00)
BKR WAM MONOCYTES: 10.9 % (ref 4.0–12.0)
BKR WAM MPV: 11 fL (ref 8.0–12.0)
BKR WAM NEUTROPHILS: 64.6 % (ref 39.0–72.0)
BKR WAM NUCLEATED RED BLOOD CELLS: 0 % (ref 0.0–1.0)
BKR WAM PLATELETS: 167 x1000/ÂµL (ref 150–420)
BKR WAM RDW-CV: 13.8 % (ref 11.0–15.0)
BKR WAM RED BLOOD CELL COUNT.: 5.3 M/ÂµL (ref 4.00–6.00)
BKR WAM WHITE BLOOD CELL COUNT: 4.9 x1000/ÂµL (ref 4.0–11.0)

## 2023-03-22 LAB — C-REACTIVE PROTEIN     (CRP): BKR C-REACTIVE PROTEIN, HIGH SENSITIVITY: 0.8 mg/L

## 2023-03-22 LAB — SEDIMENTATION RATE (ESR): BKR SEDIMENTATION RATE, ERYTHROCYTE: 10 mm/h (ref 0–20)

## 2023-03-22 NOTE — Patient Instructions (Signed)
Mike Moreno  03/22/2023     @PREFPERIOPPHARMACY @   Your procedure is scheduled on  03/29/2023.   Report to [facility] at  1200  P.M.    Call this number if you have problems the morning of surgery:  [phone]  If you experience any cold or flu symptoms such as cough, fever, chills, shortness of breath, etc. between now and your scheduled surgery, please notify us at the above number.   Remember:  Follow the diet instructions given to you by the office.    You may drink clear liquids until 1000 am on 03/29/2023.   Clear liquids allowed are:                    Water, Juice (No red color; non-citric and without pulp; diabetics please choose diet or no sugar options), Carbonated beverages (diabetics please choose diet or no sugar options), Clear Tea (No creamer, milk, or cream, including half & half and powdered creamer), Black Coffee Only (No creamer, milk or cream, including half & half and powdered creamer), and Clear Sports drink (No red color; diabetics please choose diet or no sugar options)    Take these medicines the morning of surgery with A SIP OF WATER          alfuzosin, gabapentin, midodrine, propranolol.     Do not wear jewelry, make-up or nail polish, including gel polish,  artificial nails, or any other type of covering on natural nails (fingers and  toes).  Do not wear lotions, powders, or perfumes, or deodorant.  Do not shave 48 hours prior to surgery.  Men may shave face and neck.  Do not bring valuables to the hospital.  [facility] is not responsible for any belongings or valuables.  Contacts, dentures or bridgework may not be worn into surgery.  Leave your suitcase in the car.  After surgery it may be brought to your room.  For patients admitted to the hospital, discharge time will be determined by your treatment team.  Patients discharged the day of surgery will not be allowed to drive home and must have someone with them for 24 hours.     Special instructions:   DO NOT smoke tobacco or vape for 24 hours before your procedure.  Please read over the following fact sheets that you were given. Anesthesia Post-op Instructions and Care and Recovery After Surgery      Upper Endoscopy, Adult, Care After After the procedure, it is common to have a sore throat. It is also common to have: Mild stomach pain or discomfort. Bloating. Nausea. Follow these instructions at home: The instructions below may help you care for yourself at home. Your health care provider may give you more instructions. If you have questions, ask your health care provider. If you were given a sedative during the procedure, it can affect you for several hours. Do not drive or operate machinery until your health care provider says that it is safe. If you will be going home right after the procedure, plan to have a responsible adult: Take you home from the hospital or clinic. You will not be allowed to drive. Care for you for the time you are told. Follow instructions from your health care provider about what you may eat and drink. Return to your normal activities as told by your health care provider. Ask your health care provider what activities are safe for you. Take over-the-counter and prescription medicines only  as told by your health care provider. Contact a health care provider if you: Have a sore throat that lasts longer than one day. Have trouble swallowing. Have a fever. Get help right away if you: Vomit blood or your vomit looks like coffee grounds. Have bloody, black, or tarry stools. Have a very bad sore throat or you cannot swallow. Have difficulty breathing or very bad pain in your chest or abdomen. These symptoms may be an emergency. Get help right away. Call 911. Do not wait to see if the symptoms will go away. Do not drive yourself to the hospital. Summary After the procedure, it is common to have a sore throat, mild stomach  discomfort, bloating, and nausea. If you were given a sedative during the procedure, it can affect you for several hours. Do not drive until your health care provider says that it is safe. Follow instructions from your health care provider about what you may eat and drink. Return to your normal activities as told by your health care provider. This information is not intended to replace advice given to you by your health care provider. Make sure you discuss any questions you have with your health care provider. Document Revised: 07/21/2021 Document Reviewed: 07/21/2021 Elsevier Patient Education  2024 [employer] Monitored Anesthesia Care, Care After The following information offers guidance on how to care for yourself after your procedure. Your health care provider may also give you more specific instructions. If you have problems or questions, contact your health care provider. What can I expect after the procedure? After the procedure, it is common to have: Tiredness. Little or no memory about what happened during or after the procedure. Impaired judgment when it comes to making decisions. Nausea or vomiting. Some trouble with balance. Follow these instructions at home: For the time period you were told by your health care provider:  Rest. Do not participate in activities where you could fall or become injured. Do not drive or use machinery. Do not drink alcohol. Do not take sleeping pills or medicines that cause drowsiness. Do not make important decisions or sign legal documents. Do not take care of children on your own. Medicines Take over-the-counter and prescription medicines only as told by your health care provider. If you were prescribed antibiotics, take them as told by your health care provider. Do not stop using the antibiotic even if you start to feel better. Eating and drinking Follow instructions from your health care provider about what you may eat and drink. Drink  enough fluid to keep your urine pale yellow. If you vomit: Drink clear fluids slowly and in small amounts as you are able. Clear fluids include water, ice chips, low-calorie sports drinks, and fruit juice that has water added to it (diluted fruit juice). Eat light and bland foods in small amounts as you are able. These foods include bananas, applesauce, rice, lean meats, toast, and crackers. General instructions  Have a responsible adult stay with you for the time you are told. It is important to have someone help care for you until you are awake and alert. If you have sleep apnea, surgery and some medicines can increase your risk for breathing problems. Follow instructions from your health care provider about wearing your sleep device: When you are sleeping. This includes during daytime naps. While taking prescription pain medicines, sleeping medicines, or medicines that make you drowsy. Do not use any products that contain nicotine or tobacco. These products include cigarettes, chewing tobacco, and vaping devices,  such as e-cigarettes. If you need help quitting, ask your health care provider. Contact a health care provider if: You feel nauseous or vomit every time you eat or drink. You feel light-headed. You are still sleepy or having trouble with balance after 24 hours. You get a rash. You have a fever. You have redness or swelling around the IV site. Get help right away if: You have trouble breathing. You have new confusion after you get home. These symptoms may be an emergency. Get help right away. Call 911. Do not wait to see if the symptoms will go away. Do not drive yourself to the hospital. This information is not intended to replace advice given to you by your health care provider. Make sure you discuss any questions you have with your health care provider. Document Revised: 09/06/2021 Document Reviewed: 09/06/2021 Elsevier Patient Education  2024 [employer].

## 2023-03-24 DIAGNOSIS — K7460 Unspecified cirrhosis of liver: Secondary | ICD-10-CM | POA: Diagnosis not present

## 2023-03-25 LAB — CBC WITH DIFFERENTIAL/PLATELET
Basophils Absolute: 0 10*3/uL (ref 0.0–0.2)
Basos: 0 %
EOS (ABSOLUTE): 0.3 10*3/uL (ref 0.0–0.4)
Eos: 3 %
Hematocrit: 30.5 % — ABNORMAL LOW (ref 37.5–51.0)
Hemoglobin: 10.6 g/dL — ABNORMAL LOW (ref 13.0–17.7)
Immature Grans (Abs): 0 10*3/uL (ref 0.0–0.1)
Immature Granulocytes: 1 %
Lymphocytes Absolute: 1.5 10*3/uL (ref 0.7–3.1)
Lymphs: 18 %
MCH: 36.8 pg — ABNORMAL HIGH (ref 26.6–33.0)
MCHC: 34.8 g/dL (ref 31.5–35.7)
MCV: 106 fL — ABNORMAL HIGH (ref 79–97)
Monocytes Absolute: 1 10*3/uL — ABNORMAL HIGH (ref 0.1–0.9)
Monocytes: 12 %
Neutrophils Absolute: 5.4 10*3/uL (ref 1.4–7.0)
Neutrophils: 66 %
Platelets: 60 10*3/uL — CL (ref 150–450)
RBC: 2.88 x10E6/uL — ABNORMAL LOW (ref 4.14–5.80)
RDW: 12.3 % (ref 11.6–15.4)
WBC: 8.2 10*3/uL (ref 3.4–10.8)

## 2023-03-27 ENCOUNTER — Inpatient Hospital Stay: Payer: Medicare HMO | Attending: Oncology | Admitting: Oncology

## 2023-03-27 ENCOUNTER — Encounter: Payer: Non-veteran care | Admitting: Oncology

## 2023-03-27 ENCOUNTER — Encounter: Admit: 2023-03-27 | Payer: PRIVATE HEALTH INSURANCE | Attending: Infectious Disease | Primary: Family Medicine

## 2023-03-27 VITALS — BP 114/52 | HR 62 | Temp 97.8°F | Resp 17 | Wt 215.2 lb

## 2023-03-27 DIAGNOSIS — K625 Hemorrhage of anus and rectum: Secondary | ICD-10-CM | POA: Diagnosis not present

## 2023-03-27 DIAGNOSIS — Z79899 Other long term (current) drug therapy: Secondary | ICD-10-CM | POA: Diagnosis not present

## 2023-03-27 DIAGNOSIS — D696 Thrombocytopenia, unspecified: Secondary | ICD-10-CM | POA: Insufficient documentation

## 2023-03-27 DIAGNOSIS — D649 Anemia, unspecified: Secondary | ICD-10-CM | POA: Diagnosis not present

## 2023-03-27 DIAGNOSIS — K7460 Unspecified cirrhosis of liver: Secondary | ICD-10-CM | POA: Diagnosis not present

## 2023-03-27 DIAGNOSIS — K55059 Acute (reversible) ischemia of intestine, part and extent unspecified: Secondary | ICD-10-CM | POA: Diagnosis not present

## 2023-03-27 DIAGNOSIS — K55069 Acute infarction of intestine, part and extent unspecified: Secondary | ICD-10-CM | POA: Diagnosis not present

## 2023-03-27 NOTE — Assessment & Plan Note (Signed)
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 No current signs of ascites. -Continue monitoring under the care of Dr. Jena Gauss.

## 2023-03-27 NOTE — Assessment & Plan Note (Signed)
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 Iron studies normal. -Continue monitoring, no specific intervention at this time.

## 2023-03-27 NOTE — Progress Notes (Signed)
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 Likely secondary to underlying cirrhosis. Given low platelet count and recent history of rectal bleeding, the risk of anticoagulation with bleeding outweighs the benefit. -No anticoagulation at this time due to risk of bleeding.    Return to clinic in 3 months for follow-up  Anemia Likely secondary to chronic inflammation from liver disease. Iron studies normal. -Continue monitoring, no specific intervention at this time.   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  Arrange: 45 -70.  Reports some episodes of rectal bleeding. -No requirement for transfusion at this time.  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 No current signs of ascites. -Continue monitoring under the care of Dr. Jena Gauss.   Rectal bleeding No recent episodes of rectal bleeding. Patient scheduled for endoscopy. -Continue monitoring, follow up after endoscopy.   No orders of the defined types were placed in this encounter.   The total time spent in the appointment was 20 minutes encounter with patients including review of chart and various tests results, discussions about plan of care and coordination of care plan   All questions were answered. The patient knows to call the clinic with any problems, questions or concerns. No barriers to learning was detected.  Cindie Crumbly, MD 12/2/20244:23 PM   SUMMARY OF HEMATOLOGIC HISTORY: CT: 12/27/22: Incidental finding of small SMV thrombosis.  Scan done for assessing rectal bleeding.  The patient has a history of liver cirrhosis.   INTERVAL HISTORY: Mike Moreno 77 y.o. male is here for follow-up for SMV  thrombosis and anemia.Patient reports some fatigue today but did not have any other rectal bleeding since her last visit.  Discussed the lab results and that anemia and thrombocytopenia are likely secondary to liver cirrhosis and following up with GI as essential.  We also discussed risk versus benefits of starting anticoagulation and decided not to do it with a platelet count being low.  I have reviewed the past medical history, past surgical history, social history and family history with the patient   ALLERGIES:  has No Known Allergies.  MEDICATIONS:  Current Outpatient Medications  Medication Sig Dispense Refill   alfuzosin (UROXATRAL) 10 MG 24 hr tablet Take 10 mg by mouth daily.     bumetanide (BUMEX) 1 MG tablet Take 1 mg by mouth daily.     ferrous sulfate 325 (65 FE) MG EC tablet Take 1 tablet (325 mg total) by mouth every other day. 45 tablet 3   gabapentin (NEURONTIN) 100 MG capsule Take 100 mg by mouth at bedtime.     midodrine (PROAMATINE) 5 MG tablet Take 5 mg by mouth 3 (three) times daily.     nitroGLYCERIN (NITROSTAT) 0.4 MG SL tablet Place 0.4 mg under the tongue as needed for chest pain.     propranolol (INDERAL) 10 MG tablet Take 10 mg by mouth 2 (two) times daily.     sodium bicarbonate 650 MG tablet Take 650 mg by mouth 2 (two) times daily.     spironolactone (ALDACTONE) 25 MG tablet Take 25 mg by mouth daily.     No current facility-administered medications for this visit.     REVIEW OF SYSTEMS:   Constitutional: Denies fevers, chills or night sweats  Eyes: Denies blurriness of vision Ears, nose, mouth, throat, and face: Denies mucositis or sore throat Respiratory: Denies cough, dyspnea or wheezes Cardiovascular: Denies palpitation, chest discomfort or lower extremity swelling Gastrointestinal:  Denies nausea, heartburn or change in bowel habits Skin: Denies abnormal skin rashes Lymphatics: Denies new lymphadenopathy or easy bruising Neurological:Denies numbness,  tingling or new weaknesses Behavioral/Psych: Mood is stable, no new changes  All other systems were reviewed with the patient and are negative.  PHYSICAL EXAMINATION:   Vitals:   03/27/23 0916  BP: (!) 114/52  Pulse: 62  Resp: 17  Temp: 97.8 F (36.6 C)  SpO2: 100%    GENERAL:alert, no distress and comfortable LUNGS: clear to auscultation and percussion with normal breathing effort HEART: regular rate & rhythm and no murmurs and no lower extremity edema ABDOMEN:abdomen soft, non-tender and normal bowel sounds, distended. Musculoskeletal:no cyanosis of digits and no clubbing  NEURO: alert & oriented x 3 with fluent speech  LABORATORY DATA:  I have reviewed the data as listed  Lab Results  Component Value Date   WBC 8.2 03/24/2023   NEUTROABS 5.4 03/24/2023   HGB 10.6 (L) 03/24/2023   HCT 30.5 (L) 03/24/2023   MCV 106 (H) 03/24/2023   PLT 60 (LL) 03/24/2023      Component Value Date/Time   NA 142 03/07/2023 1345   K 3.9 03/07/2023 1345   CL 107 (H) 03/07/2023 1345   CO2 22 03/07/2023 1345   GLUCOSE 91 03/07/2023 1345   GLUCOSE 118 (H) 01/12/2023 1418   BUN 29 (H) 03/07/2023 1345   CREATININE 1.60 (H) 03/07/2023 1345   CALCIUM 8.5 (L) 03/07/2023 1345   PROT 5.4 (L) 03/07/2023 1345   ALBUMIN 3.0 (L) 03/07/2023 1345   AST 86 (H) 03/07/2023 1345   ALT 42 03/07/2023 1345   ALKPHOS 128 (H) 03/07/2023 1345   BILITOT 3.2 (H) 03/07/2023 1345   GFRNONAA 36 (L) 01/12/2023 1418       Chemistry      Component Value Date/Time   NA 142 03/07/2023 1345   K 3.9 03/07/2023 1345   CL 107 (H) 03/07/2023 1345   CO2 22 03/07/2023 1345   BUN 29 (H) 03/07/2023 1345   CREATININE 1.60 (H) 03/07/2023 1345      Component Value Date/Time   CALCIUM 8.5 (L) 03/07/2023 1345   ALKPHOS 128 (H) 03/07/2023 1345   AST 86 (H) 03/07/2023 1345   ALT 42 03/07/2023 1345   BILITOT 3.2 (H) 03/07/2023 1345      Latest Reference Range & Units 05/30/22 10:46 06/17/22 10:29 06/17/22 10:32  06/22/22 12:37 01/09/23 12:41 02/22/23 11:25  Iron 45 - 182 ug/dL 161     096  UIBC ug/dL 045     65  TIBC 409 - 450 ug/dL 811     914 (L)  Saturation Ratios 17.9 - 39.5 %      70 (H)  Ferritin 24 - 336 ng/mL 333     407 (H)  Iron Saturation 15 - 55 % 48       Folate >5.9 ng/mL   10.6 9.3 10.0 13.5  Vitamin B12 180 - 914 pg/mL  843   1,855 (H) 1,165 (H)  (L): Data is abnormally low (H): Data is abnormally high

## 2023-03-27 NOTE — Assessment & Plan Note (Signed)
Likely secondary to liver cirrhosis.  Arrange: 45 -70.  Reports some episodes of rectal bleeding. -No requirement for transfusion at this time.  Will monitor for now

## 2023-03-27 NOTE — Assessment & Plan Note (Signed)
No recent episodes of rectal bleeding. Patient scheduled for endoscopy. -Continue monitoring, follow up after endoscopy.

## 2023-03-27 NOTE — Patient Instructions (Signed)
VISIT SUMMARY:  You were seen today for a follow-up on your cirrhosis, recent thrombosis, and anemia. We discussed your current health status and made plans for ongoing management and monitoring.  YOUR PLAN:  -VENOUS THROMBOEMBOLISM: You have a history of blood clots, but we are not starting blood thinners because your platelet count is low due to your liver condition. We recommend continuing to stay active to help prevent new clots from forming.  -CIRRHOSIS: Cirrhosis is severe scarring of the liver often due to long-term liver damage. Your condition is currently stable without signs of fluid buildup in the abdomen. Continue to monitor your condition with Dr. Jena Gauss.   -ANEMIA: Anemia means you have a lower than normal number of red blood cells, likely due to chronic inflammation from your liver disease. Your iron levels are normal, so no specific treatment is needed right now. We will keep monitoring your condition.  -GASTROINTESTINAL BLEEDING: You have not had any recent episodes of rectal bleeding. You are scheduled for an endoscopy to check for any issues in your digestive tract. We will continue to monitor and follow up after the procedure.  INSTRUCTIONS:  Please follow up in 3 months with lab tests. Continue to stay active and monitor for any new symptoms. Attend your scheduled endoscopy and follow up with [place] afterward.

## 2023-03-27 NOTE — Assessment & Plan Note (Signed)
Small clot identified in the SMV on recent CT scan. Likely secondary to underlying cirrhosis. Given low platelet count and recent history of rectal bleeding, the risk of anticoagulation with bleeding outweighs the benefit. -No anticoagulation at this time due to risk of bleeding.    Return to clinic in 3 months for follow-up

## 2023-03-28 ENCOUNTER — Encounter (HOSPITAL_COMMUNITY)
Admission: RE | Admit: 2023-03-28 | Discharge: 2023-03-28 | Disposition: A | Discharge status: Home / Self Care | Payer: Medicare HMO | Source: Ambulatory Visit | Attending: Internal Medicine | Admitting: Internal Medicine

## 2023-03-28 ENCOUNTER — Encounter (HOSPITAL_COMMUNITY): Payer: Self-pay

## 2023-03-28 VITALS — BP 114/52 | HR 62 | Temp 97.8°F | Resp 18 | Ht 71.0 in | Wt 215.2 lb

## 2023-03-28 DIAGNOSIS — D696 Thrombocytopenia, unspecified: Secondary | ICD-10-CM

## 2023-03-28 DIAGNOSIS — D649 Anemia, unspecified: Secondary | ICD-10-CM

## 2023-03-28 DIAGNOSIS — K7460 Unspecified cirrhosis of liver: Secondary | ICD-10-CM

## 2023-03-29 ENCOUNTER — Ambulatory Visit (HOSPITAL_COMMUNITY): Payer: Medicare HMO | Admitting: Anesthesiology

## 2023-03-29 ENCOUNTER — Encounter (HOSPITAL_COMMUNITY)
Admission: RE | Disposition: A | Discharge status: Home / Self Care | Payer: Self-pay | Source: Home / Self Care | Attending: Internal Medicine

## 2023-03-29 ENCOUNTER — Ambulatory Visit (HOSPITAL_COMMUNITY)
Admission: RE | Admit: 2023-03-29 | Discharge: 2023-03-29 | Disposition: A | Discharge status: Home / Self Care | Payer: Medicare HMO | Attending: Internal Medicine | Admitting: Internal Medicine

## 2023-03-29 DIAGNOSIS — K7469 Other cirrhosis of liver: Secondary | ICD-10-CM | POA: Diagnosis not present

## 2023-03-29 DIAGNOSIS — Z87891 Personal history of nicotine dependence: Secondary | ICD-10-CM | POA: Insufficient documentation

## 2023-03-29 DIAGNOSIS — D649 Anemia, unspecified: Secondary | ICD-10-CM | POA: Insufficient documentation

## 2023-03-29 DIAGNOSIS — D696 Thrombocytopenia, unspecified: Secondary | ICD-10-CM | POA: Diagnosis not present

## 2023-03-29 DIAGNOSIS — K2990 Gastroduodenitis, unspecified, without bleeding: Secondary | ICD-10-CM | POA: Diagnosis not present

## 2023-03-29 DIAGNOSIS — K269 Duodenal ulcer, unspecified as acute or chronic, without hemorrhage or perforation: Secondary | ICD-10-CM | POA: Insufficient documentation

## 2023-03-29 DIAGNOSIS — K319 Disease of stomach and duodenum, unspecified: Secondary | ICD-10-CM | POA: Diagnosis not present

## 2023-03-29 DIAGNOSIS — K766 Portal hypertension: Secondary | ICD-10-CM | POA: Diagnosis not present

## 2023-03-29 DIAGNOSIS — K2950 Unspecified chronic gastritis without bleeding: Secondary | ICD-10-CM | POA: Insufficient documentation

## 2023-03-29 DIAGNOSIS — K2980 Duodenitis without bleeding: Secondary | ICD-10-CM | POA: Insufficient documentation

## 2023-03-29 DIAGNOSIS — I959 Hypotension, unspecified: Secondary | ICD-10-CM | POA: Diagnosis not present

## 2023-03-29 DIAGNOSIS — K3189 Other diseases of stomach and duodenum: Secondary | ICD-10-CM | POA: Diagnosis not present

## 2023-03-29 DIAGNOSIS — Z79899 Other long term (current) drug therapy: Secondary | ICD-10-CM | POA: Diagnosis not present

## 2023-03-29 DIAGNOSIS — K7460 Unspecified cirrhosis of liver: Secondary | ICD-10-CM

## 2023-03-29 HISTORY — PX: BIOPSY: SHX5522

## 2023-03-29 HISTORY — PX: ESOPHAGOGASTRODUODENOSCOPY (EGD) WITH PROPOFOL: SHX5813

## 2023-03-29 SURGERY — ESOPHAGOGASTRODUODENOSCOPY (EGD) WITH PROPOFOL
Anesthesia: General

## 2023-03-29 MED ORDER — PHENYLEPHRINE 80 MCG/ML (10ML) SYRINGE FOR IV PUSH (FOR BLOOD PRESSURE SUPPORT)
PREFILLED_SYRINGE | INTRAVENOUS | Status: AC
Start: 2023-03-29 — End: ?
  Filled 2023-03-29: qty 30

## 2023-03-29 MED ORDER — PROPOFOL 10 MG/ML IV BOLUS
INTRAVENOUS | Status: DC | PRN
  Administered 2023-03-29: 30 mg via INTRAVENOUS
  Administered 2023-03-29: 80 mg via INTRAVENOUS

## 2023-03-29 MED ORDER — LACTATED RINGERS IV SOLN: INTRAVENOUS | Status: DC | PRN

## 2023-03-29 MED ORDER — LIDOCAINE HCL (CARDIAC) PF 100 MG/5ML IV SOSY
PREFILLED_SYRINGE | INTRAVENOUS | Status: DC | PRN
  Administered 2023-03-29: 60 mg via INTRATRACHEAL

## 2023-03-29 MED ORDER — PROPOFOL 500 MG/50ML IV EMUL
INTRAVENOUS | Status: AC
Start: 2023-03-29 — End: ?
  Filled 2023-03-29: qty 150

## 2023-03-29 MED ORDER — PROPOFOL 500 MG/50ML IV EMUL
INTRAVENOUS | Status: DC | PRN
  Administered 2023-03-29: 200 ug/kg/min via INTRAVENOUS

## 2023-03-29 MED ORDER — LIDOCAINE HCL (PF) 2 % IJ SOLN
INTRAMUSCULAR | Status: AC
  Filled 2023-03-29: qty 35

## 2023-03-29 MED ORDER — PROPOFOL 1000 MG/100ML IV EMUL
INTRAVENOUS | Status: AC
  Filled 2023-03-29: qty 400

## 2023-03-29 NOTE — Op Note (Signed)
[facility] Patient Name: Mike Moreno Procedure Date: 03/29/2023 2:15 PM MRN: [id number] Date of Birth: 1945-05-19 Attending MD: Gennette Pac , MD, [id number] CSN: [id number] Age: 77 Admit Type: Outpatient Procedure:                Upper GI endoscopy Indications:              Screening procedure, Cirrhosis rule out esophageal                            varices Providers:                Gennette Pac, MD, Sheran Fava,                            Kristine L. Jessee Avers, Technician Referring MD:              Medicines:                Propofol per Anesthesia Complications:            No immediate complications. Estimated Blood Loss:     Estimated blood loss was minimal. Procedure:                Pre-Anesthesia Assessment:                           - Prior to the procedure, a History and Physical                            was performed, and patient medications and                            allergies were reviewed. The patient's tolerance of                            previous anesthesia was also reviewed. The risks                            and benefits of the procedure and the sedation                            options and risks were discussed with the patient.                            All questions were answered, and informed consent                            was obtained. Prior Anticoagulants: The patient has                            taken no anticoagulant or antiplatelet agents. ASA                            Grade Assessment: III - A patient with severe  systemic disease. After reviewing the risks and                            benefits, the patient was deemed in satisfactory                            condition to undergo the procedure.                           After obtaining informed consent, the endoscope was                            passed under direct vision. Throughout the                            procedure,  the patient's blood pressure, pulse, and                            oxygen saturations were monitored continuously. The                            [id number]-[id number] ([id number]) scope was introduced through the                            mouth, and advanced to the second part of duodenum.                            The upper GI endoscopy was accomplished without                            difficulty. The patient tolerated the procedure                            well. Scope In: 2:31:33 PM Scope Out: 2:36:09 PM Total Procedure Duration: 0 hours 4 minutes 36 seconds  Findings:      The examined esophagus was normal.      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 Touch friability. No neoplasm. No gastric varices or ulcer.       Pylorus patent.      Examination of bulb second portion revealed multiple bulbar and second       portion erosions.      Biopsies of the abnormal antrum and body taken. Also, biopsies of the       duodenum taken. Impression:               - Normal esophagus.                           - Portal hypertensive gastropathy -severe with                            touch friability. Status post biopsy                           -Duodenal bulbar and D2  erosions?"status post biopsy. Moderate Sedation:      Moderate (conscious) sedation was personally administered by an       anesthesia professional. The following parameters were monitored: oxygen       saturation, heart rate, blood pressure, respiratory rate, EKG, adequacy       of pulmonary ventilation, and response to care. Recommendation:           - Patient has a contact number available for                            emergencies. The signs and symptoms of potential                            delayed complications were discussed with the                            patient. Return to normal activities tomorrow.                            Written discharge instructions were provided to the                             patient.                           - Advance diet as tolerated.                           - Continue present medications. Follow-up on                            pathology. Consider repeat EGD in 12 months                            depending on clinical stability. Keep office visit                            12/17 with Tana Coast. Procedure Code(s):        --- Professional ---                           [id number], Esophagogastroduodenoscopy, flexible,                            transoral; diagnostic, including collection of                            specimen(s) by brushing or washing, when performed                            (separate procedure) Diagnosis Code(s):        --- Professional ---                           K76.6, Portal hypertension  K31.89, Other diseases of stomach and duodenum                           Z13.810, Encounter for screening for upper                            gastrointestinal disorder                           K74.60, Unspecified cirrhosis of liver CPT copyright 2022 American Medical Association. All rights reserved. The codes documented in this report are preliminary and upon coder review may  be revised to meet current compliance requirements. Mike Moreno. Mailani Degroote, MD Gennette Pac, MD 03/29/2023 2:42:20 PM This report has been signed electronically. Number of Addenda: 0

## 2023-03-29 NOTE — Anesthesia Preprocedure Evaluation (Addendum)
Anesthesia Evaluation  Patient identified by MRN, date of birth, ID band Patient awake    Reviewed: Allergy & Precautions, H&P , NPO status , Patient's Chart, lab work & pertinent test results, reviewed documented beta blocker date and time   Airway Mallampati: II  TM Distance: >3 FB Neck ROM: Full    Dental  (+) Dental Advisory Given, Missing, Chipped, Poor Dentition   Pulmonary former smoker   Pulmonary exam normal breath sounds clear to auscultation       Cardiovascular Exercise Tolerance: Good hypertension, Pt. on medications and Pt. on home beta blockers Normal cardiovascular exam Rhythm:Regular Rate:Normal  17-Jun-2022 10:18:12 [facility] ROUTINE RECORD Oct 03, 1945 (24 yr) Male Caucasian Vent. rate 53 BPM PR interval 164 ms QRS duration 92 ms QT/QTcB 494/463 ms P-R-T axes 65 68 28 Sinus bradycardia Otherwise normal ECG No previous ECGs available Confirmed by Carylon Perches [phone]) on 06/19/2022 11:22:47 AM   Neuro/Psych neuropathy  Neuromuscular disease  negative psych ROS   GI/Hepatic negative GI ROS,,,(+) Cirrhosis     substance abuse  alcohol use  Endo/Other  negative endocrine ROS    Renal/GU negative Renal ROS  negative genitourinary   Musculoskeletal negative musculoskeletal ROS (+)    Abdominal Normal abdominal exam  (+)   Peds negative pediatric ROS (+)  Hematology  (+) Blood dyscrasia (thrombocytopenia), anemia   Anesthesia Other Findings On midodrine for low blood pressure  Reproductive/Obstetrics negative OB ROS                             Anesthesia Physical Anesthesia Plan  ASA: 3  Anesthesia Plan: General   Post-op Pain Management: Minimal or no pain anticipated   Induction: Intravenous  PONV Risk Score and Plan: 1 and Propofol infusion  Airway Management Planned: Natural Airway and Nasal Cannula  Additional Equipment:  None  Intra-op Plan:   Post-operative Plan:   Informed Consent: I have reviewed the patients History and Physical, chart, labs and discussed the procedure including the risks, benefits and alternatives for the proposed anesthesia with the patient or authorized representative who has indicated his/her understanding and acceptance.     Dental advisory given  Plan Discussed with: CRNA and Surgeon  Anesthesia Plan Comments:        Anesthesia Quick Evaluation

## 2023-03-29 NOTE — Discharge Instructions (Signed)
EGD Discharge instructions Please read the instructions outlined below and refer to this sheet in the next few weeks. These discharge instructions provide you with general information on caring for yourself after you leave the hospital. Your doctor may also give you specific instructions. While your treatment has been planned according to the most current medical practices available, unavoidable complications occasionally occur. If you have any problems or questions after discharge, please call your doctor. ACTIVITY You may resume your regular activity but move at a slower pace for the next 24 hours.  Take frequent rest periods for the next 24 hours.  Walking will help expel (get rid of) the air and reduce the bloated feeling in your abdomen.  No driving for 24 hours (because of the anesthesia (medicine) used during the test).  You may shower.  Do not sign any important legal documents or operate any machinery for 24 hours (because of the anesthesia used during the test).  NUTRITION Drink plenty of fluids.  You may resume your normal diet.  Begin with a light meal and progress to your normal diet.  Avoid alcoholic beverages for 24 hours or as instructed by your caregiver.  MEDICATIONS You may resume your normal medications unless your caregiver tells you otherwise.  WHAT YOU CAN EXPECT TODAY You may experience abdominal discomfort such as a feeling of fullness or "gas" pains.  FOLLOW-UP Your doctor will discuss the results of your test with you.  SEEK IMMEDIATE MEDICAL ATTENTION IF ANY OF THE FOLLOWING OCCUR: Excessive nausea (feeling sick to your stomach) and/or vomiting.  Severe abdominal pain and distention (swelling).  Trouble swallowing.  Temperature over 101 F (37.8 C).  Rectal bleeding or vomiting of blood.       No esophageal varices found  Changes in your stomach consistent with liver disease.   inflammation present.  Biopsies taken.    Further recommendations to follow  pending review of pathology report  Tentatively, recommend a repeat upper endoscopy in 1 year  Keep 12/17 office visit with [place]   at patient request, I called Sherryle Lis at [phone] -  call rolled to voicemail.  "Voicemail full"

## 2023-03-29 NOTE — Anesthesia Postprocedure Evaluation (Signed)
Anesthesia Post Note  Patient: NEKO KOMISAR  Procedure(s) Performed: ESOPHAGOGASTRODUODENOSCOPY (EGD) WITH PROPOFOL BIOPSY  Patient location during evaluation: PACU Anesthesia Type: General Level of consciousness: awake and alert Pain management: pain level controlled Vital Signs Assessment: post-procedure vital signs reviewed and stable Respiratory status: spontaneous breathing, nonlabored ventilation, respiratory function stable and patient connected to nasal cannula oxygen Cardiovascular status: blood pressure returned to baseline and stable Postop Assessment: no apparent nausea or vomiting Anesthetic complications: no   There were no known notable events for this encounter.   Last Vitals:  Vitals:   03/29/23 1215 03/29/23 1441  BP: (!) 114/45 (!) 92/48  Pulse: (!) 52 (!) 55  Resp: 14 13  Temp:  (!) 36.1 C  SpO2: 100% 96%    Last Pain:  Vitals:   03/29/23 1441  TempSrc: Axillary  PainSc: 0-No pain                 Maxamilian Amadon L Kennethia Lynes

## 2023-03-29 NOTE — Transfer of Care (Signed)
Immediate Anesthesia Transfer of Care Note  Patient: Mike Moreno  Procedure(s) Performed: ESOPHAGOGASTRODUODENOSCOPY (EGD) WITH PROPOFOL BIOPSY  Patient Location: Endoscopy Unit  Anesthesia Type:General  Level of Consciousness: sedated  Airway & Oxygen Therapy: Patient Spontanous Breathing  Post-op Assessment: Report given to RN and Post -op Vital signs reviewed and stable  Post vital signs: Reviewed and stable  Last Vitals:  Vitals Value Taken Time  BP 96/55   Temp 98   Pulse 57 03/29/23 1440  Resp 13 03/29/23 1440  SpO2 96 % 03/29/23 1440  Vitals shown include unfiled device data.  Last Pain:  Vitals:   03/29/23 1426  PainSc: 4          Complications: No notable events documented.

## 2023-03-29 NOTE — H&P (Signed)
@LOGO @   Primary Care Physician:  Toma Deiters, MD Primary Gastroenterologist:  Dr. Jena Gauss  Pre-Procedure History & Physical: HPI:  Mike Moreno is a 77 y.o. 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  Not anticoagulated.  Possible mass in the liver concerning for hepatocellular carcinoma.  Screening upper GI tract  today.  Past Medical History:  Diagnosis Date   BPH (benign prostatic hyperplasia)    Cirrhosis (HCC)    HTN (hypertension)    Hyperlipidemia    Neuropathy     Past Surgical History:  Procedure Laterality Date   bilateral inguinal hernias     BIOPSY  12/07/2016   Procedure: BIOPSY;  Surgeon: Corbin Ade, MD;  [facility];  Service: Endoscopy;;  ileocecal valve   COLONOSCOPY  08/2011   According to [facility] notes, 2 tubular adenomatous colon polyps removed next surveillance colonoscopy in 5 years   COLONOSCOPY N/A 12/07/2016   Procedure: COLONOSCOPY;  Surgeon: Corbin Ade, MD;  [facility]: [facility];  Service: Endoscopy;  Laterality: N/A;  1030    COLONOSCOPY WITH PROPOFOL N/A 06/22/2022   Procedure: COLONOSCOPY WITH PROPOFOL;  Surgeon: Corbin Ade, MD;  [facility]: [facility];  Service: Endoscopy;  Laterality: N/A;  1:45 pm, pt can't move transportation   HAND SURGERY     traumatic injury   POLYPECTOMY  12/07/2016   Procedure: POLYPECTOMY;  Surgeon: Corbin Ade, MD;  [facility];  Service: Endoscopy;;  colon   POLYPECTOMY  06/22/2022   Procedure: POLYPECTOMY;  Surgeon: Corbin Ade, MD;  [facility];  Service: Endoscopy;;   UMBILICAL HERNIA REPAIR      Prior to Admission medications   Medication Sig Start Date End Date Taking? Authorizing Provider  alfuzosin (UROXATRAL) 10 MG 24 hr tablet Take 10 mg by mouth daily. 04/16/22  Yes [provider]  bumetanide (BUMEX) 1 MG tablet Take 1 mg by mouth daily.   Yes [provider]  ferrous  sulfate 325 (65 FE) MG EC tablet Take 1 tablet (325 mg total) by mouth every other day. 02/22/23  Yes Cindie Crumbly, MD  gabapentin (NEURONTIN) 100 MG capsule Take 100 mg by mouth at bedtime.   Yes [provider]  midodrine (PROAMATINE) 5 MG tablet Take 5 mg by mouth 3 (three) times daily. 05/28/22  Yes [provider]  propranolol (INDERAL) 10 MG tablet Take 10 mg by mouth 2 (two) times daily. 05/23/22  Yes [provider]  sodium bicarbonate 650 MG tablet Take 650 mg by mouth 2 (two) times daily. 05/10/22  Yes [provider]  spironolactone (ALDACTONE) 25 MG tablet Take 25 mg by mouth daily. 04/04/22  Yes [provider]  nitroGLYCERIN (NITROSTAT) 0.4 MG SL tablet Place 0.4 mg under the tongue as needed for chest pain.    [provider]    Allergies as of 02/21/2023   (No Known Allergies)    Family History  Problem Relation Age of Onset   Cirrhosis Brother    Colon cancer Neg Hx     Social History   Socioeconomic History   Marital status: Single    Spouse name: Not on file   Number of children: Not on file   Years of education: Not on file   Highest education level: Not on file  Occupational History   Not on file  Tobacco Use   Smoking status: Former  Types: Cigarettes, Cigars   Smokeless tobacco: Never   Tobacco comments:    quit 2015  Vaping Use   Vaping status: Never Used  Substance and Sexual Activity   Alcohol use: No    Comment: some etoh 20 years ago   Drug use: No    Comment: not since 2005   Sexual activity: Not Currently  Other Topics Concern   Not on file  Social History Narrative   Not on file   Social Determinants of Health   Financial Resource Strain: Low Risk  (04/17/2021)   Received from [facility], [facility]   Overall Financial Resource Strain (CARDIA)    Difficulty of Paying Living Expenses: Not hard at all  Food Insecurity: No Food Insecurity (02/22/2023)   Hunger Vital Sign     Worried About Running Out of Food in the Last Year: Never true    Ran Out of Food in the Last Year: Never true  Transportation Needs: No Transportation Needs (02/22/2023)   PRAPARE - Administrator, Civil Service (Medical): No    Lack of Transportation (Non-Medical): No  Physical Activity: Not on file  Stress: No Stress Concern Present (04/17/2021)   Received from [facility], [facility] - Occupational Stress Questionnaire    Feeling of Stress : Not at all  Social Connections: Unknown (08/27/2021)   Received from [facility], [facility]   Social Network    Social Network: Not on file  Intimate Partner Violence: Not At Risk (02/22/2023)   Humiliation, Afraid, Rape, and Kick questionnaire    Fear of Current or Ex-Partner: No    Emotionally Abused: No    Physically Abused: No    Sexually Abused: No    Review of Systems: See HPI, otherwise negative ROS  Physical Exam: BP (!) 114/45   Pulse (!) 52   Temp 98.2 F (36.8 C)   Resp 14   SpO2 100%  General:   Alert,  Well-developed, well-nourished, pleasant and cooperative in NAD Lungs:  Clear throughout to auscultation.   No wheezes, crackles, or rhonchi. No acute distress. Heart:  Regular rate and rhythm; no murmurs, clicks, rubs,  or gallops. Abdomen: Non-distended, normal bowel sounds.  Soft and nontender without appreciable mass or hepatosplenomegaly.   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    No dysphagia. The risks, benefits, limitations, alternatives and imponderables have been reviewed with the patient. Potential for esophageal dilation, biopsy, etc. have also been reviewed.  Questions have been answered. All parties agreeable.      Notice: This dictation was prepared with Dragon dictation along with smaller phrase technology. Any transcriptional  errors that result from this process are unintentional and may not be corrected upon review.

## 2023-03-31 LAB — SURGICAL PATHOLOGY

## 2023-04-06 ENCOUNTER — Encounter (HOSPITAL_COMMUNITY): Payer: Self-pay | Admitting: Internal Medicine

## 2023-04-11 ENCOUNTER — Ambulatory Visit: Payer: No Typology Code available for payment source | Admitting: Gastroenterology

## 2023-04-11 ENCOUNTER — Encounter: Admit: 2023-04-11 | Payer: Medicare (Managed Care) | Attending: Family Medicine | Primary: Family Medicine

## 2023-04-11 MED ORDER — CIPROFLOXACIN 500 MG TABLET
500 | ORAL_TABLET | Freq: Two times a day (BID) | ORAL | 1 refills | 7.00000 days | Status: AC
Start: 2023-04-11 — End: 2023-06-22

## 2023-04-12 ENCOUNTER — Encounter
Admit: 2023-04-12 | Payer: PRIVATE HEALTH INSURANCE | Attending: Vascular and Interventional Radiology | Primary: Family Medicine

## 2023-04-13 ENCOUNTER — Encounter
Admit: 2023-04-13 | Payer: PRIVATE HEALTH INSURANCE | Attending: Vascular and Interventional Radiology | Primary: Family Medicine

## 2023-04-20 ENCOUNTER — Inpatient Hospital Stay: Admit: 2023-04-20 | Discharge: 2023-04-20 | Payer: PRIVATE HEALTH INSURANCE | Primary: Family Medicine

## 2023-04-20 DIAGNOSIS — M4646 Discitis, unspecified, lumbar region: Secondary | ICD-10-CM

## 2023-04-21 LAB — C-REACTIVE PROTEIN     (CRP): BKR C-REACTIVE PROTEIN, HIGH SENSITIVITY: 1.4 mg/L

## 2023-04-21 LAB — SEDIMENTATION RATE (ESR): BKR SEDIMENTATION RATE, ERYTHROCYTE: 5 mm/h (ref 0–20)

## 2023-04-21 NOTE — Other
 Good, repeat in a month as planned.

## 2023-04-24 ENCOUNTER — Encounter: Payer: Self-pay | Admitting: Internal Medicine

## 2023-04-28 ENCOUNTER — Other Ambulatory Visit: Payer: Self-pay | Admitting: Gastroenterology

## 2023-04-28 ENCOUNTER — Encounter: Payer: Self-pay | Admitting: Gastroenterology

## 2023-04-28 ENCOUNTER — Ambulatory Visit (INDEPENDENT_AMBULATORY_CARE_PROVIDER_SITE_OTHER): Payer: Medicare HMO | Admitting: Gastroenterology

## 2023-04-28 VITALS — BP 110/48 | HR 53 | Temp 98.4°F | Ht 71.0 in | Wt 211.8 lb

## 2023-04-28 DIAGNOSIS — D696 Thrombocytopenia, unspecified: Secondary | ICD-10-CM | POA: Diagnosis not present

## 2023-04-28 DIAGNOSIS — D649 Anemia, unspecified: Secondary | ICD-10-CM

## 2023-04-28 DIAGNOSIS — K7460 Unspecified cirrhosis of liver: Secondary | ICD-10-CM

## 2023-04-28 DIAGNOSIS — A09 Infectious gastroenteritis and colitis, unspecified: Secondary | ICD-10-CM

## 2023-04-28 DIAGNOSIS — Z8619 Personal history of other infectious and parasitic diseases: Secondary | ICD-10-CM | POA: Diagnosis not present

## 2023-04-28 DIAGNOSIS — K766 Portal hypertension: Secondary | ICD-10-CM

## 2023-04-28 DIAGNOSIS — R197 Diarrhea, unspecified: Secondary | ICD-10-CM | POA: Diagnosis not present

## 2023-04-28 DIAGNOSIS — K3189 Other diseases of stomach and duodenum: Secondary | ICD-10-CM

## 2023-04-28 NOTE — Progress Notes (Signed)
 GI Office Note    Referring Provider: Orpha Yancey LABOR, MD Primary Care Physician:  Orpha Yancey LABOR, MD  Primary Gastroenterologist: Ozell Hollingshead, MD   Chief Complaint   Chief Complaint  Patient presents with   Follow-up    Follow up after EGD. Pt states he is better but he is still having diarrhea that comes and goes    History of Present Illness   Mike Moreno is a 78 y.o. male presenting today for follow up of cirrhosis. Last seen 01/2023. CT earlier this year with focal filling defect in origin of SMV with flow on both sides suggesting nonocclusive thrombus. Referred to hematology. He was diagnosed with Cdiff back in 12/2022, treated with 10 day course of vanc.  MELD 3.0 of 18 back in 01/2023. MELD 3.0 of 20 in 02/2023.  Other labs November 2024 with hemoglobin of 10.7, had been 9.7 in October and 12.5 in September.  Platelets 60,000.  INR 1.4.  BUN 29, creatinine 1.6, albumin  3, total bilirubin 3.2, alkaline phosphatase 128, AST 86, ALT 42.  AFP 5.6 in 12/2022. CT 12/2022. Due for hepatoma screening in 06/2023.   Hep A/B vaccines completed at [facility], patient brought documentation, received vaccines Feb/March/Sept.  Today: overall doing ok. Feels like some fluid coming back in legs just in the past couple of days. Trying to follow 2g sodium diet. Continues to have issues with stools. Loose stools intermittently, several times per week. Associated with urgency and incontinence at times. Takes pepto at times will turn stools black but at other times has no melena, brbpr. No abdominal pain. No hard stools or straining lately. Rarely skips a a day without stool. No heartburn. No n/v. Appetite fair.  Patient states he is back on propranolol . [place] had [place] Patient denies fatigue, lightheadedness, chest pain, or sob.   EGD 03/2023: normal esophagus. Portal hypertensive gastropathy-severe with touch  friability s/p bx. Duodenal bulbar and D2 erosions s/p bx. Mild inflammation noted on bx but no H.pylori.   Labs at [facility] 12/27/22: Heme positive stool, sodium 138, potassium 4.3, creatinine 1.54, BUN 32, albumin  2.6, total bilirubin 4, alkaline phosphatase 120, AST 81, ALT 73, white blood cell count 7200, hemoglobin 12.5, MCV 108.8, platelets 51,000   CT A/P with contrast 12/27/22: 1. 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 Small amount of abdominal and pelvic ascites.  2. Focal filling defect in the origin of the superior mesenteric  vein with flow on both sides suggesting nonocclusive thrombus.  3. No evidence of bowel obstruction or inflammation.  4. Aortic atherosclerosis.  5. Prostate gland is enlarged.  6. Nonobstructing stone in the right kidney.    CT A/P without contrast 01/2020 done after a fall, showing cirrhosis. Subsequent CT in 03/2021 with multiple hypodensities in the periphery of the liver, at one time thought worrisome for primary or metastatic neoplasm but now thought to be areas of asymmetry and fatty replacement or areas of hepatitis. Last CT as outlined below.    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 Concern for possible HCC but MRI not an option due to elevated creatinine. Patient elected to go home with comfort measures and no further work up of liver lesions.   CT Abd without contrast 01/2022: 1. 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 Single subcentimeter low-density lesion in  the right  hepatic lobe remains too small to characterize. No definite new  liver lesions identified on noncontrast imaging.  2. Question mild wall thickening of the gastric antrum and proximal  duodenum, which may be secondary to underdistention or  gastritis/duodenitis.  3. Small left pleural effusion with adjacent atelectasis. Volume of  effusion has slightly  decreased from prior.  4. Nonobstructing right renal stone.  5. Aortic atherosclerosis (ICD10-I70.0).    His brother had cirrhosis felt to be due to etoh use. Patient states he used to drink regularly but quit in 1983. Remote drug use. Patient still works.     He had thrombocytopenia (08999) in 03/2021. He is on propranolol  10mg  BID but he is unsure purpose. He has never had EGD. Also on midodrine , furosemide , and spironolactone .    Colonoscopy 11/2016: -one 4mm polyp in rectum, benign -nodular ileal mucosa, benign -surveillance colonoscopy in 5 years   Colonoscopy 05/2022: -four 4-24mm polyps in sigmoid colon, transverse colon and cecum removed.  -multiple tubular adenomas -consider another colonoscopy in five years if overall health permits.  Medications   Current Outpatient Medications  Medication Sig Dispense Refill   alfuzosin  (UROXATRAL ) 10 MG 24 hr tablet Take 10 mg by mouth daily.     bumetanide (BUMEX) 1 MG tablet Take 1 mg by mouth daily.     ferrous sulfate  325 (65 FE) MG EC tablet Take 1 tablet (325 mg total) by mouth every other day. 45 tablet 3   gabapentin  (NEURONTIN ) 100 MG capsule Take 100 mg by mouth at bedtime.     midodrine  (PROAMATINE ) 5 MG tablet Take 5 mg by mouth 3 (three) times daily.     nitroGLYCERIN (NITROSTAT) 0.4 MG SL tablet Place 0.4 mg under the tongue as needed for chest pain.     propranolol  (INDERAL ) 10 MG tablet Take 10 mg by mouth 2 (two) times daily.     sodium bicarbonate  650 MG tablet Take 650 mg by mouth 2 (two) times daily.     spironolactone  (ALDACTONE ) 25 MG tablet Take 25 mg by mouth daily.     No current facility-administered medications for this visit.    Allergies   Allergies as of 04/28/2023   (No Known Allergies)      Review of Systems   General: Negative for  weight loss, fever, chills, fatigue, weakness. Appetite fair. Still working. ENT: Negative for hoarseness, difficulty swallowing , nasal congestion. CV: Negative for  chest pain, angina, palpitations, dyspnea on exertion, +peripheral edema.  Respiratory: Negative for dyspnea at rest, dyspnea on exertion, cough, sputum, wheezing.  GI: See history of present illness. GU:  Negative for dysuria, hematuria, urinary incontinence, urinary frequency, nocturnal urination.  Endo: Negative for unusual weight change.     Physical Exam   BP (!) 110/48   Pulse (!) 53   Temp 98.4 F (36.9 C)   Ht 5' 11 (1.803 m)   Wt 211 lb 12.8 oz (96.1 kg)   BMI 29.54 kg/m    General: Well-nourished, well-developed in no acute distress.  Eyes: No icterus. Mouth: Oropharyngeal mucosa moist and pink  Lungs: Clear to auscultation bilaterally.  Heart: Regular rate and rhythm, no murmurs rubs or gallops.  Abdomen: Bowel sounds are normal, nontender, nondistended, no hepatosplenomegaly or masses,  no abdominal bruits or hernia , no rebound or guarding.  Rectal: not performed  Extremities: No lower extremity edema. No clubbing or deformities. Neuro: Alert and oriented x 4   Skin: Warm and dry, no jaundice.  Psych: Alert and cooperative, normal mood and affect.  Labs   Lab Results  Component Value Date   NA 142 03/07/2023   CL 107 (H) 03/07/2023   K 3.9 03/07/2023   CO2 22 03/07/2023   BUN 29 (H) 03/07/2023   CREATININE 1.60 (H) 03/07/2023   EGFR 44 (L) 03/07/2023   CALCIUM 8.5 (L) 03/07/2023   ALBUMIN  3.0 (L) 03/07/2023   GLUCOSE 91 03/07/2023   Lab Results  Component Value Date   ALT 42 03/07/2023   AST 86 (H) 03/07/2023   ALKPHOS 128 (H) 03/07/2023   BILITOT 3.2 (H) 03/07/2023   Lab Results  Component Value Date   WBC 8.2 03/24/2023   HGB 10.6 (L) 03/24/2023   HCT 30.5 (L) 03/24/2023   MCV 106 (H) 03/24/2023   PLT 60 (LL) 03/24/2023   Lab Results  Component Value Date   IRON 149 02/22/2023   TIBC 214 (L) 02/22/2023   FERRITIN 407 (H) 02/22/2023   Lab Results  Component Value Date   VITAMINB12 1,165 (H) 02/22/2023   Lab Results  Component  Value Date   FOLATE 13.5 02/22/2023    Imaging Studies   No results found.  Assessment/Plan:   Cirrhosis: with thrombocytopenia, portal HTN gastropathy.  He has been vaccinated for both Hep A and Hep B. More recent MELD of 20. History of decompensated disease with concern for hepatorenal syndrome and possible HCC but MRI could not be done due to elevated creatinine at that time. Patient elected to go home with comfort measures and had no further GI/hepatology evaluation until we saw him earlier in 2024. Follow up liver imaging without concern for suspicious liver lesions -update labs -due for hepatoma screening in 06/2023 -he has minimal lower ext edema today, just started couple of days ago. Will continue current diuretic regimen but may require increasing if persistent or worsening edema. -2 gram sodium diet   Anemia: likely multifactorial. Cannot exclude chronic occult GI bleeding given recent EGD findings. Followed by hematology. Iron sat 70, serum iron 149, ferritin 407. Plans for recheck in 05/2023.   Diarrhea: history of Cdiff. No longer on lactulose .  -recheck Cdiff [facility]  Return ov in 07/2023.   Sonny RAMAN. Ezzard, MHS, PA-C [facility]

## 2023-04-28 NOTE — Patient Instructions (Addendum)
 Continue current medications for now. Please monitor for persistent or worsening swelling of your legs, reach out if needed.  You may require increased dosage of your fluid pills.  Will keep your medication at current dose for now unless your swelling persist or worsens. Please go to the lab to pick up stool container. You will need to go to [facility] at [address] in [place]. You can have your labs done either when you pick up the container or when you return the container.  Return office visit in April 2025. We will reach out to schedule your liver ultrasound prior to your next office visit.

## 2023-04-29 LAB — COMPREHENSIVE METABOLIC PANEL
ALT: 32 [IU]/L (ref 0–44)
AST: 53 [IU]/L — ABNORMAL HIGH (ref 0–40)
Albumin: 2.8 g/dL — ABNORMAL LOW (ref 3.8–4.8)
Alkaline Phosphatase: 117 [IU]/L (ref 44–121)
BUN/Creatinine Ratio: 13 (ref 10–24)
BUN: 20 mg/dL (ref 8–27)
Bilirubin Total: 2.5 mg/dL — ABNORMAL HIGH (ref 0.0–1.2)
CO2: 18 mmol/L — ABNORMAL LOW (ref 20–29)
Calcium: 8.4 mg/dL — ABNORMAL LOW (ref 8.6–10.2)
Chloride: 113 mmol/L — ABNORMAL HIGH (ref 96–106)
Creatinine, Ser: 1.54 mg/dL — ABNORMAL HIGH (ref 0.76–1.27)
Globulin, Total: 2.5 g/dL (ref 1.5–4.5)
Glucose: 115 mg/dL — ABNORMAL HIGH (ref 70–99)
Potassium: 4.7 mmol/L (ref 3.5–5.2)
Sodium: 140 mmol/L (ref 134–144)
Total Protein: 5.3 g/dL — ABNORMAL LOW (ref 6.0–8.5)
eGFR: 46 mL/min/{1.73_m2} — ABNORMAL LOW (ref >59)

## 2023-04-29 LAB — SPECIMEN STATUS REPORT

## 2023-04-29 LAB — CBC WITH DIFFERENTIAL/PLATELET
Basophils Absolute: 0 10*3/uL (ref 0.0–0.2)
Basos: 0 %
EOS (ABSOLUTE): 0.1 10*3/uL (ref 0.0–0.4)
Eos: 2 %
Hematocrit: 28.5 % — ABNORMAL LOW (ref 37.5–51.0)
Hemoglobin: 10 g/dL — ABNORMAL LOW (ref 13.0–17.7)
Immature Grans (Abs): 0 10*3/uL (ref 0.0–0.1)
Immature Granulocytes: 1 %
Lymphocytes Absolute: 1 10*3/uL (ref 0.7–3.1)
Lymphs: 23 %
MCH: 37 pg — ABNORMAL HIGH (ref 26.6–33.0)
MCHC: 35.1 g/dL (ref 31.5–35.7)
MCV: 106 fL — ABNORMAL HIGH (ref 79–97)
Monocytes Absolute: 0.7 10*3/uL (ref 0.1–0.9)
Monocytes: 16 %
Neutrophils Absolute: 2.5 10*3/uL (ref 1.4–7.0)
Neutrophils: 58 %
Platelets: 58 10*3/uL — CL (ref 150–450)
RBC: 2.7 x10E6/uL — CL (ref 4.14–5.80)
RDW: 12.7 % (ref 11.6–15.4)
WBC: 4.4 10*3/uL (ref 3.4–10.8)

## 2023-04-29 LAB — PROTIME-INR
INR: 1.4 — ABNORMAL HIGH (ref 0.9–1.2)
Prothrombin Time: 15.2 s — ABNORMAL HIGH (ref 9.1–12.0)

## 2023-05-03 ENCOUNTER — Other Ambulatory Visit: Payer: Self-pay

## 2023-05-03 DIAGNOSIS — K7460 Unspecified cirrhosis of liver: Secondary | ICD-10-CM

## 2023-05-03 DIAGNOSIS — D649 Anemia, unspecified: Secondary | ICD-10-CM

## 2023-05-23 ENCOUNTER — Inpatient Hospital Stay: Admit: 2023-05-23 | Discharge: 2023-05-23 | Payer: PRIVATE HEALTH INSURANCE | Primary: Family Medicine

## 2023-05-23 DIAGNOSIS — M4646 Discitis, unspecified, lumbar region: Secondary | ICD-10-CM

## 2023-05-23 LAB — C-REACTIVE PROTEIN     (CRP): BKR C-REACTIVE PROTEIN, HIGH SENSITIVITY: 1 mg/L

## 2023-05-23 LAB — SEDIMENTATION RATE (ESR): BKR SEDIMENTATION RATE, ERYTHROCYTE: 5 mm/h (ref 0–20)

## 2023-05-24 ENCOUNTER — Telehealth: Admit: 2023-05-24 | Payer: PRIVATE HEALTH INSURANCE | Attending: Family Medicine | Primary: Family Medicine

## 2023-05-24 NOTE — Other
 Inflammatory markers are very low

## 2023-05-29 DIAGNOSIS — G621 Alcoholic polyneuropathy: Secondary | ICD-10-CM | POA: Diagnosis not present

## 2023-05-29 DIAGNOSIS — K5902 Outlet dysfunction constipation: Secondary | ICD-10-CM | POA: Diagnosis not present

## 2023-05-29 DIAGNOSIS — Z6829 Body mass index (BMI) 29.0-29.9, adult: Secondary | ICD-10-CM | POA: Diagnosis not present

## 2023-05-29 DIAGNOSIS — N1831 Chronic kidney disease, stage 3a: Secondary | ICD-10-CM | POA: Diagnosis not present

## 2023-05-29 DIAGNOSIS — K7031 Alcoholic cirrhosis of liver with ascites: Secondary | ICD-10-CM | POA: Diagnosis not present

## 2023-05-29 DIAGNOSIS — R197 Diarrhea, unspecified: Secondary | ICD-10-CM | POA: Diagnosis not present

## 2023-05-29 DIAGNOSIS — N400 Enlarged prostate without lower urinary tract symptoms: Secondary | ICD-10-CM | POA: Diagnosis not present

## 2023-05-29 DIAGNOSIS — I5032 Chronic diastolic (congestive) heart failure: Secondary | ICD-10-CM | POA: Diagnosis not present

## 2023-05-29 DIAGNOSIS — Z0000 Encounter for general adult medical examination without abnormal findings: Secondary | ICD-10-CM | POA: Diagnosis not present

## 2023-05-29 DIAGNOSIS — I10 Essential (primary) hypertension: Secondary | ICD-10-CM | POA: Diagnosis not present

## 2023-06-19 ENCOUNTER — Inpatient Hospital Stay: Payer: Medicare HMO | Attending: Oncology

## 2023-06-19 DIAGNOSIS — D696 Thrombocytopenia, unspecified: Secondary | ICD-10-CM | POA: Insufficient documentation

## 2023-06-19 DIAGNOSIS — D649 Anemia, unspecified: Secondary | ICD-10-CM | POA: Insufficient documentation

## 2023-06-19 DIAGNOSIS — K55059 Acute (reversible) ischemia of intestine, part and extent unspecified: Secondary | ICD-10-CM | POA: Insufficient documentation

## 2023-06-19 DIAGNOSIS — K7460 Unspecified cirrhosis of liver: Secondary | ICD-10-CM | POA: Insufficient documentation

## 2023-06-19 LAB — IRON AND TIBC
Iron: 176 ug/dL (ref 45–182)
Saturation Ratios: 72 % — ABNORMAL HIGH (ref 17.9–39.5)
TIBC: 245 ug/dL — ABNORMAL LOW (ref 250–450)
UIBC: 69 ug/dL

## 2023-06-19 LAB — CBC WITH DIFFERENTIAL/PLATELET
Abs Immature Granulocytes: 0.01 10*3/uL (ref 0.00–0.07)
Basophils Absolute: 0 10*3/uL (ref 0.0–0.1)
Basophils Relative: 1 %
Eosinophils Absolute: 0.2 10*3/uL (ref 0.0–0.5)
Eosinophils Relative: 4 %
HCT: 30.8 % — ABNORMAL LOW (ref 39.0–52.0)
Hemoglobin: 10.1 g/dL — ABNORMAL LOW (ref 13.0–17.0)
Immature Granulocytes: 0 %
Lymphocytes Relative: 27 %
Lymphs Abs: 1 10*3/uL (ref 0.7–4.0)
MCH: 36.1 pg — ABNORMAL HIGH (ref 26.0–34.0)
MCHC: 32.8 g/dL (ref 30.0–36.0)
MCV: 110 fL — ABNORMAL HIGH (ref 80.0–100.0)
Monocytes Absolute: 0.5 10*3/uL (ref 0.1–1.0)
Monocytes Relative: 14 %
Neutro Abs: 2 10*3/uL (ref 1.7–7.7)
Neutrophils Relative %: 54 %
Platelets: 59 10*3/uL — ABNORMAL LOW (ref 150–400)
RBC: 2.8 MIL/uL — ABNORMAL LOW (ref 4.22–5.81)
RDW: 13.9 % (ref 11.5–15.5)
WBC: 3.7 10*3/uL — ABNORMAL LOW (ref 4.0–10.5)
nRBC: 0 % (ref 0.0–0.2)

## 2023-06-19 LAB — COMPREHENSIVE METABOLIC PANEL
ALT: 36 U/L (ref 0–44)
AST: 61 U/L — ABNORMAL HIGH (ref 15–41)
Albumin: 2.5 g/dL — ABNORMAL LOW (ref 3.5–5.0)
Alkaline Phosphatase: 79 U/L (ref 38–126)
Anion gap: 10 (ref 5–15)
BUN: 45 mg/dL — ABNORMAL HIGH (ref 8–23)
CO2: 23 mmol/L (ref 22–32)
Calcium: 8.4 mg/dL — ABNORMAL LOW (ref 8.9–10.3)
Chloride: 103 mmol/L (ref 98–111)
Creatinine, Ser: 2.01 mg/dL — ABNORMAL HIGH (ref 0.61–1.24)
GFR, Estimated: 34 mL/min — ABNORMAL LOW (ref >60)
Glucose, Bld: 192 mg/dL — ABNORMAL HIGH (ref 70–99)
Potassium: 3.4 mmol/L — ABNORMAL LOW (ref 3.5–5.1)
Sodium: 136 mmol/L (ref 135–145)
Total Bilirubin: 3.9 mg/dL — ABNORMAL HIGH (ref 0.0–1.2)
Total Protein: 5.6 g/dL — ABNORMAL LOW (ref 6.5–8.1)

## 2023-06-19 LAB — FOLATE: Folate: 11.5 ng/mL (ref >5.9)

## 2023-06-19 LAB — VITAMIN B12: Vitamin B-12: 1001 pg/mL — ABNORMAL HIGH (ref 180–914)

## 2023-06-19 LAB — FERRITIN: Ferritin: 279 ng/mL (ref 24–336)

## 2023-06-22 ENCOUNTER — Ambulatory Visit: Admit: 2023-06-22 | Payer: PRIVATE HEALTH INSURANCE | Attending: Family Medicine | Primary: Family Medicine

## 2023-06-22 ENCOUNTER — Ambulatory Visit: Admit: 2023-06-22 | Payer: Medicare (Managed Care) | Attending: Family Medicine | Primary: Family Medicine

## 2023-06-22 VITALS — BP 126/70 | HR 76 | Temp 97.90000°F | Wt 227.5 lb

## 2023-06-22 DIAGNOSIS — Z125 Encounter for screening for malignant neoplasm of prostate: Secondary | ICD-10-CM

## 2023-06-22 DIAGNOSIS — G062 Extradural and subdural abscess, unspecified: Secondary | ICD-10-CM

## 2023-06-22 DIAGNOSIS — E7800 Pure hypercholesterolemia, unspecified: Secondary | ICD-10-CM

## 2023-06-22 DIAGNOSIS — G600 Hereditary motor and sensory neuropathy: Secondary | ICD-10-CM

## 2023-06-22 DIAGNOSIS — M4640 Discitis, unspecified, site unspecified: Secondary | ICD-10-CM

## 2023-06-22 DIAGNOSIS — Z0000 Encounter for general adult medical examination without abnormal findings: Secondary | ICD-10-CM

## 2023-06-22 DIAGNOSIS — M4800 Spinal stenosis, site unspecified: Secondary | ICD-10-CM

## 2023-06-26 ENCOUNTER — Inpatient Hospital Stay: Payer: Medicare HMO | Attending: Oncology | Admitting: Oncology

## 2023-06-26 VITALS — BP 150/67 | HR 48 | Temp 97.2°F | Resp 17 | Ht 71.0 in | Wt 198.0 lb

## 2023-06-26 DIAGNOSIS — K7030 Alcoholic cirrhosis of liver without ascites: Secondary | ICD-10-CM | POA: Insufficient documentation

## 2023-06-26 DIAGNOSIS — D696 Thrombocytopenia, unspecified: Secondary | ICD-10-CM | POA: Diagnosis not present

## 2023-06-26 DIAGNOSIS — K7460 Unspecified cirrhosis of liver: Secondary | ICD-10-CM | POA: Diagnosis not present

## 2023-06-26 DIAGNOSIS — D649 Anemia, unspecified: Secondary | ICD-10-CM | POA: Diagnosis not present

## 2023-06-26 DIAGNOSIS — K55069 Acute infarction of intestine, part and extent unspecified: Secondary | ICD-10-CM | POA: Diagnosis not present

## 2023-06-26 NOTE — Progress Notes (Signed)
 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  Superior mesenteric vein thrombosis (HCC) Small clot identified in the SMV on recent CT scan. Likely secondary to underlying cirrhosis. Given low platelet count and recent history of rectal bleeding, the risk of anticoagulation with bleeding outweighs the benefit. -No anticoagulation at this time due to risk of bleeding.    Return to clinic in 3 months for follow-up  Anemia Likely secondary to chronic inflammation from liver disease. Iron studies normal.  Colonoscopy and endoscopy did not show any evidence of bleeding. -Continue monitoring, no specific intervention at this time.   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  Arrange: 45 -70.  No bleeding or bruising reported. -No requirement for transfusion at this time.  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 No current signs of ascites. -CT scan due in March for ruling out [facility].  Being managed by GI. -Continue monitoring under the care of Dr. Jena Gauss.   Orders Placed This Encounter  Procedures   Ferritin    Standing Status:   Future    Expected Date:   09/25/2023    Expiration Date:   06/25/2024   Folate    Standing Status:   Future    Expected Date:   09/25/2023    Expiration Date:   06/25/2024   Vitamin B12    Standing Status:   Future    Expected Date:   09/25/2023    Expiration Date:   06/25/2024   CBC with Differential/Platelet    Standing Status:   Future    Expected Date:   09/25/2023    Expiration Date:   06/25/2024   Comprehensive metabolic panel    Standing Status:   Future    Expected Date:   09/25/2023    Expiration Date:   06/25/2024   Iron and TIBC    Standing  Status:   Future    Expected Date:   09/25/2023    Expiration Date:   06/25/2024    The total time spent in the appointment was 20 minutes encounter with patients including review of chart and various tests results, discussions about plan of care and coordination of care plan   All questions were answered. The patient knows to call the clinic with any problems, questions or concerns. No barriers to learning was detected.  Cindie Crumbly, MD 3/3/20254:20 PM   SUMMARY OF HEMATOLOGIC HISTORY: CT: 12/27/22: Incidental finding of small SMV thrombosis.  Scan done for assessing rectal bleeding.  The patient has a history of liver cirrhosis.   INTERVAL HISTORY: FERLIN FAIRHURST 78 y.o. male is here for follow-up for SMV thrombosis and anemia.he had colonoscopy and endoscopy done at her last visit.The colonoscopy revealed polyps, and the endoscopy showed erosions in the stomach. The patient reports feeling "pretty good" overall. He denies any recent bleeding, fever, or chills. He does report occasional diarrhea.  He continues to not drink any alcohol.  I have reviewed the past medical history, past surgical history, social history and family history with the patient   ALLERGIES:  has no known allergies.  MEDICATIONS:  Current Outpatient Medications  Medication Sig Dispense Refill   alfuzosin (UROXATRAL) 10 MG 24 hr tablet Take 10 mg by mouth daily.  bumetanide (BUMEX) 1 MG tablet Take 1 mg by mouth daily.     gabapentin (NEURONTIN) 100 MG capsule Take 100 mg by mouth at bedtime.     midodrine (PROAMATINE) 5 MG tablet Take 5 mg by mouth 3 (three) times daily.     nitroGLYCERIN (NITROSTAT) 0.4 MG SL tablet Place 0.4 mg under the tongue as needed for chest pain.     propranolol (INDERAL) 10 MG tablet Take 10 mg by mouth 2 (two) times daily.     sodium bicarbonate 650 MG tablet Take 650 mg by mouth 2 (two) times daily.     spironolactone (ALDACTONE) 25 MG tablet Take 25 mg by mouth daily.      traZODone (DESYREL) 50 MG tablet Take 50 mg by mouth at bedtime.     No current facility-administered medications for this visit.     REVIEW OF SYSTEMS:   Constitutional: Denies fevers, chills or night sweats Eyes: Denies blurriness of vision Ears, nose, mouth, throat, and face: Denies mucositis or sore throat Respiratory: Denies cough, dyspnea or wheezes Cardiovascular: Denies palpitation, chest discomfort or lower extremity swelling Gastrointestinal:  Denies nausea, heartburn or change in bowel habits Skin: Denies abnormal skin rashes Lymphatics: Denies new lymphadenopathy or easy bruising Neurological:Denies numbness, tingling or new weaknesses Behavioral/Psych: Mood is stable, no new changes  All other systems were reviewed with the patient and are negative.  PHYSICAL EXAMINATION:   Vitals:   06/26/23 1004  BP: (!) 150/67  Pulse: (!) 48  Resp: 17  Temp: (!) 97.2 F (36.2 C)  SpO2: 98%     GENERAL:alert, no distress and comfortable LUNGS: clear to auscultation and percussion with normal breathing effort HEART: regular rate & rhythm and no murmurs and no lower extremity edema ABDOMEN:abdomen soft, non-tender and normal bowel sounds, distended. Musculoskeletal:no cyanosis of digits and no clubbing  NEURO: alert & oriented x 3 with fluent speech  LABORATORY DATA:  I have reviewed the data as listed  Lab Results  Component Value Date   WBC 3.7 (L) 06/19/2023   NEUTROABS 2.0 06/19/2023   HGB 10.1 (L) 06/19/2023   HCT 30.8 (L) 06/19/2023   MCV 110.0 (H) 06/19/2023   PLT 59 (L) 06/19/2023      Chemistry      Component Value Date/Time   NA 136 06/19/2023 0926   NA 140 04/28/2023 1032   K 3.4 (L) 06/19/2023 0926   CL 103 06/19/2023 0926   CO2 23 06/19/2023 0926   BUN 45 (H) 06/19/2023 0926   BUN 20 04/28/2023 1032   CREATININE 2.01 (H) 06/19/2023 0926      Component Value Date/Time   CALCIUM 8.4 (L) 06/19/2023 0926   ALKPHOS 79 06/19/2023 0926   AST 61 (H)  06/19/2023 0926   ALT 36 06/19/2023 0926   BILITOT 3.9 (H) 06/19/2023 0926   BILITOT 2.5 (H) 04/28/2023 1032      Latest Reference Range & Units 05/30/22 10:46 06/17/22 10:29 06/17/22 10:32 06/22/22 12:37 01/09/23 12:41 02/22/23 11:25  Iron 45 - 182 ug/dL 161     096  UIBC ug/dL 045     65  TIBC 409 - 450 ug/dL 811     914 (L)  Saturation Ratios 17.9 - 39.5 %      70 (H)  Ferritin 24 - 336 ng/mL 333     407 (H)  Iron Saturation 15 - 55 % 48       Folate >5.9 ng/mL   10.6 9.3 10.0 13.5  Vitamin B12 180 - 914 pg/mL  843   1,855 (H) 1,165 (H)  (L): Data is abnormally low (H): Data is abnormally high  03/29/2023: Upper GI endoscopy:- Normal esophagus. - Portal hypertensive gastropathy - severe with touch friability. Status post biopsy - Duodenal bulbar and D2 erosions? status post biopsy.  Pathology:FINAL MICROSCOPIC DIAGNOSIS:   A. DUODENUM, BIOPSY:  Mild acute duodenitis   B. STOMACH, BIOPSY:  Reactive gastropathy with minimal chronic gastritis  Negative for H. pylori, intestinal metaplasia, dysplasia and carcinoma

## 2023-06-26 NOTE — Assessment & Plan Note (Addendum)
 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 Iron studies normal.  Colonoscopy and endoscopy did not show any evidence of bleeding. -Continue monitoring, no specific intervention at this time.

## 2023-06-26 NOTE — Assessment & Plan Note (Addendum)
 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 No current signs of ascites. -CT scan due in March for ruling out [facility].  Being managed by GI. -Continue monitoring under the care of Dr. Jena Gauss.

## 2023-06-26 NOTE — Assessment & Plan Note (Signed)
 Likely secondary to liver cirrhosis.  Arrange: 45 -70.  No bleeding or bruising reported. -No requirement for transfusion at this time.  Will monitor for now

## 2023-06-26 NOTE — Patient Instructions (Addendum)
 VISIT SUMMARY:  You came in today for a follow-up visit after your recent endoscopy and colonoscopy. The colonoscopy revealed polyps, and the endoscopy showed erosions in your stomach. You reported feeling pretty good overall, with no recent bleeding, fever, or chills, but occasional diarrhea. 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  YOUR PLAN:  -LIVER CIRRHOSIS: Liver cirrhosis is a condition where the liver is scarred and permanently damaged. Your condition is stable with no new symptoms. We will continue with your current management plan, and you do not need blood thinners due to the risk of bleeding. A CT scan is scheduled for March to monitor for liver cancer.  -ANEMIA: Anemia is a condition where you do not have enough healthy red blood cells to carry adequate oxygen to your body's tissues. Your anemia is stable and likely related to your liver cirrhosis and possible minor bleeding. Your iron levels are good, so no transfusion or iron replacement is needed at this time. We will continue to monitor your condition.  -GASTROINTESTINAL HEALTH: Your recent colonoscopy and endoscopy showed polyps and erosions in your stomach. You have not had any recent rectal bleeding but do experience occasional diarrhea. We will continue with your current management plan and repeat the colonoscopy as recommended by your GI specialist.  -GENERAL HEALTH MAINTENANCE: We will see you again in three months for a follow-up visit. Labs will be ordered before your next visit. If your condition remains stable at that time, we will transition to six-month follow-up visits.  INSTRUCTIONS:  Please return in three months for a follow-up visit. Make sure to get your labs done before your next visit. If everything is stable at your three-month follow-up, we will move to six-month follow-up visits.

## 2023-06-26 NOTE — Assessment & Plan Note (Signed)
 Small clot identified in the SMV on recent CT scan. Likely secondary to underlying cirrhosis. Given low platelet count and recent history of rectal bleeding, the risk of anticoagulation with bleeding outweighs the benefit. -No anticoagulation at this time due to risk of bleeding.    Return to clinic in 3 months for follow-up

## 2023-07-18 ENCOUNTER — Other Ambulatory Visit: Payer: Self-pay

## 2023-07-18 DIAGNOSIS — K7460 Unspecified cirrhosis of liver: Secondary | ICD-10-CM

## 2023-07-18 DIAGNOSIS — D649 Anemia, unspecified: Secondary | ICD-10-CM

## 2023-07-21 ENCOUNTER — Inpatient Hospital Stay: Admit: 2023-07-21 | Discharge: 2023-07-21 | Primary: Family Medicine

## 2023-07-21 DIAGNOSIS — M4646 Discitis, unspecified, lumbar region: Principal | ICD-10-CM

## 2023-07-21 LAB — C-REACTIVE PROTEIN     (CRP): BKR C-REACTIVE PROTEIN, HIGH SENSITIVITY: 1.1 mg/L

## 2023-07-21 LAB — SEDIMENTATION RATE (ESR): BKR SEDIMENTATION RATE, ERYTHROCYTE: 6 mm/h (ref 0–20)

## 2023-07-23 NOTE — Other
 CRP remains very low.

## 2023-07-24 ENCOUNTER — Encounter: Admit: 2023-07-24 | Payer: PRIVATE HEALTH INSURANCE | Primary: Family Medicine

## 2023-07-27 DIAGNOSIS — K7460 Unspecified cirrhosis of liver: Secondary | ICD-10-CM | POA: Diagnosis not present

## 2023-07-27 DIAGNOSIS — D649 Anemia, unspecified: Secondary | ICD-10-CM | POA: Diagnosis not present

## 2023-07-28 LAB — COMPREHENSIVE METABOLIC PANEL WITH GFR
ALT: 32 IU/L (ref 0–44)
AST: 47 IU/L — ABNORMAL HIGH (ref 0–40)
Albumin: 2.8 g/dL — ABNORMAL LOW (ref 3.8–4.8)
Alkaline Phosphatase: 92 IU/L (ref 44–121)
BUN/Creatinine Ratio: 17 (ref 10–24)
BUN: 34 mg/dL — ABNORMAL HIGH (ref 8–27)
Bilirubin Total: 5.1 mg/dL — ABNORMAL HIGH (ref 0.0–1.2)
CO2: 21 mmol/L (ref 20–29)
Calcium: 9 mg/dL (ref 8.6–10.2)
Chloride: 107 mmol/L — ABNORMAL HIGH (ref 96–106)
Creatinine, Ser: 1.98 mg/dL — ABNORMAL HIGH (ref 0.76–1.27)
Globulin, Total: 2.5 g/dL (ref 1.5–4.5)
Glucose: 137 mg/dL — ABNORMAL HIGH (ref 70–99)
Potassium: 4.4 mmol/L (ref 3.5–5.2)
Sodium: 141 mmol/L (ref 134–144)
Total Protein: 5.3 g/dL — ABNORMAL LOW (ref 6.0–8.5)
eGFR: 34 mL/min/{1.73_m2} — ABNORMAL LOW (ref >59)

## 2023-07-28 LAB — CBC WITH DIFFERENTIAL/PLATELET
Basophils Absolute: 0 10*3/uL (ref 0.0–0.2)
Basos: 0 %
EOS (ABSOLUTE): 0.1 10*3/uL (ref 0.0–0.4)
Eos: 2 %
Hematocrit: 32.8 % — ABNORMAL LOW (ref 37.5–51.0)
Hemoglobin: 11.2 g/dL — ABNORMAL LOW (ref 13.0–17.7)
Immature Grans (Abs): 0 10*3/uL (ref 0.0–0.1)
Immature Granulocytes: 0 %
Lymphocytes Absolute: 0.9 10*3/uL (ref 0.7–3.1)
Lymphs: 18 %
MCH: 36.5 pg — ABNORMAL HIGH (ref 26.6–33.0)
MCHC: 34.1 g/dL (ref 31.5–35.7)
MCV: 107 fL — ABNORMAL HIGH (ref 79–97)
Monocytes Absolute: 0.4 10*3/uL (ref 0.1–0.9)
Monocytes: 7 %
Neutrophils Absolute: 3.8 10*3/uL (ref 1.4–7.0)
Neutrophils: 73 %
Platelets: 43 10*3/uL — CL (ref 150–450)
RBC: 3.07 x10E6/uL — ABNORMAL LOW (ref 4.14–5.80)
RDW: 12.8 % (ref 11.6–15.4)
WBC: 5.2 10*3/uL (ref 3.4–10.8)

## 2023-07-28 LAB — PROTIME-INR
INR: 1.3 — ABNORMAL HIGH (ref 0.9–1.2)
Prothrombin Time: 14.5 s — ABNORMAL HIGH (ref 9.1–12.0)

## 2023-07-28 LAB — AFP TUMOR MARKER: AFP, Serum, Tumor Marker: 6.2 ng/mL (ref 0.0–8.4)

## 2023-07-30 NOTE — Progress Notes (Unsigned)
 GI Office Note    Referring Provider: Toma Deiters, MD Primary Care Physician:  Toma Deiters, MD  Primary Gastroenterologist: Roetta Sessions, MD   Chief Complaint   No chief complaint on file.   History of Present Illness   Mike Moreno is a 78 y.o. male presenting today for follow up. Last seen 04/2023. H/o cirrhosis, nonocclusive SMV thrombus, Cdiff.       CT A/P with contrast 12/27/22: 1. 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 Small amount of abdominal and pelvic ascites.  2. Focal filling defect in the origin of the superior mesenteric  vein with flow on both sides suggesting nonocclusive thrombus.  3. No evidence of bowel obstruction or inflammation.  4. Aortic atherosclerosis.  5. Prostate gland is enlarged.  6. Nonobstructing stone in the right kidney.    CT A/P without contrast 01/2020 done after a fall, showing cirrhosis. Subsequent CT in 03/2021 with multiple hypodensities in the periphery of the liver, at one time thought worrisome for primary or metastatic neoplasm but now thought to be areas of asymmetry and fatty replacement or areas of hepatitis. Last CT as outlined below.    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 Concern for possible HCC but MRI not an option due to elevated creatinine. Patient elected to go home with comfort measures and no further work up of liver lesions.   CT Abd without contrast 01/2022: 1. 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 Single subcentimeter low-density lesion in the right  hepatic lobe remains too small to characterize. No definite new  liver lesions identified on noncontrast imaging.  2. Question mild wall thickening of the gastric antrum and proximal  duodenum, which may be secondary to underdistention or  gastritis/duodenitis.  3. Small left pleural effusion with adjacent  atelectasis. Volume of  effusion has slightly decreased from prior.  4. Nonobstructing right renal stone.  5. Aortic atherosclerosis (ICD10-I70.0).    His brother had cirrhosis felt to be due to etoh use. Patient states he used to drink regularly but quit in 1983. Remote drug use. Patient still works.     He had thrombocytopenia (81191) in 03/2021. He is on propranolol 10mg  BID but he is unsure purpose. He has never had EGD. Also on midodrine, furosemide, and spironolactone.    Colonoscopy 11/2016: -one 4mm polyp in rectum, benign -nodular ileal mucosa, benign -surveillance colonoscopy in 5 years   Colonoscopy 05/2022: -four 4-19mm polyps in sigmoid colon, transverse colon and cecum removed.  -multiple tubular adenomas -consider another colonoscopy in five years if overall health permits.    EGD 03/2023: normal esophagus. 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 Duodenal bulbar and D2 erosions s/p bx. Mild inflammation noted on bx but no H.pylori.   Medications   Current Outpatient Medications  Medication Sig Dispense Refill   alfuzosin (UROXATRAL) 10 MG 24 hr tablet Take 10 mg by mouth daily.     bumetanide (BUMEX) 1 MG tablet Take 1 mg by mouth daily.     gabapentin (NEURONTIN) 100 MG capsule Take 100 mg by mouth at bedtime.     midodrine (PROAMATINE) 5 MG tablet Take 5 mg by mouth 3 (three) times daily.     nitroGLYCERIN (NITROSTAT) 0.4 MG SL tablet Place 0.4 mg under the tongue as needed for chest pain.     propranolol (INDERAL) 10 MG tablet Take 10 mg by  mouth 2 (two) times daily.     sodium bicarbonate 650 MG tablet Take 650 mg by mouth 2 (two) times daily.     spironolactone (ALDACTONE) 25 MG tablet Take 25 mg by mouth daily.     traZODone (DESYREL) 50 MG tablet Take 50 mg by mouth at bedtime.     No current facility-administered medications for this visit.    Allergies   Allergies as of 07/31/2023   (No Known Allergies)     Past Medical  History   Past Medical History:  Diagnosis Date   BPH (benign prostatic hyperplasia)    Cirrhosis (HCC)    HTN (hypertension)    Hyperlipidemia    Neuropathy     Past Surgical History   Past Surgical History:  Procedure Laterality Date   bilateral inguinal hernias     BIOPSY  12/07/2016   Procedure: BIOPSY;  Surgeon: Corbin Ade, MD;  [facility];  Service: Endoscopy;;  ileocecal valve   BIOPSY  03/29/2023   Procedure: BIOPSY;  Surgeon: Corbin Ade, MD;  [facility]: [facility];  Service: Endoscopy;;   COLONOSCOPY  08/2011   According to [facility] notes, 2 tubular adenomatous colon polyps removed next surveillance colonoscopy in 5 years   COLONOSCOPY N/A 12/07/2016   Procedure: COLONOSCOPY;  Surgeon: Corbin Ade, MD;  [facility]: [facility];  Service: Endoscopy;  Laterality: N/A;  1030    COLONOSCOPY WITH PROPOFOL N/A 06/22/2022   Procedure: COLONOSCOPY WITH PROPOFOL;  Surgeon: Corbin Ade, MD;  [facility]: [facility];  Service: Endoscopy;  Laterality: N/A;  1:45 pm, pt can't move transportation   ESOPHAGOGASTRODUODENOSCOPY (EGD) WITH PROPOFOL N/A 03/29/2023   Procedure: ESOPHAGOGASTRODUODENOSCOPY (EGD) WITH PROPOFOL;  Surgeon: Corbin Ade, MD;  [facility];  Service: Endoscopy;  Laterality: N/A;  2:00 pm, asa 3   HAND SURGERY     traumatic injury   POLYPECTOMY  12/07/2016   Procedure: POLYPECTOMY;  Surgeon: Corbin Ade, MD;  [facility];  Service: Endoscopy;;  colon   POLYPECTOMY  06/22/2022   Procedure: POLYPECTOMY;  Surgeon: Corbin Ade, MD;  [facility]: [facility];  Service: Endoscopy;;   UMBILICAL HERNIA REPAIR      Past Family History   Family History  Problem Relation Age of Onset   Cirrhosis Brother    Colon cancer Neg Hx     Past Social History   Social History   Socioeconomic History   Marital status: Single    Spouse name: Not on file   Number of children: Not on file   Years of education: Not on  file   Highest education level: Not on file  Occupational History   Not on file  Tobacco Use   Smoking status: Former    Types: Cigarettes, Cigars   Smokeless tobacco: Never   Tobacco comments:    quit 2015  Vaping Use   Vaping status: Never Used  Substance and Sexual Activity   Alcohol use: No    Comment: some etoh 20 years ago   Drug use: No    Comment: not since 2005   Sexual activity: Not Currently  Other Topics Concern   Not on file  Social History Narrative   Not on file   Social Drivers of Health   Financial Resource Strain: Low Risk  (04/17/2021)   Received from [facility]   Overall Financial Resource Strain (CARDIA)    Difficulty of Paying Living  Expenses: Not hard at all  Food Insecurity: No Food Insecurity (02/22/2023)   Hunger Vital Sign    Worried About Running Out of Food in the Last Year: Never true    Ran Out of Food in the Last Year: Never true  Transportation Needs: No Transportation Needs (02/22/2023)   PRAPARE - Administrator, Civil Service (Medical): No    Lack of Transportation (Non-Medical): No  Physical Activity: Not on file  Stress: No Stress Concern Present (04/17/2021)   Received from [facility] - Occupational Stress Questionnaire    Feeling of Stress : Not at all  Social Connections: Unknown (08/27/2021)   Received from [facility]   Social Network    Social Network: Not on file  Intimate Partner Violence: Not At Risk (02/22/2023)   Humiliation, Afraid, Rape, and Kick questionnaire    Fear of Current or Ex-Partner: No    Emotionally Abused: No    Physically Abused: No    Sexually Abused: No    Review of Systems   General: Negative for anorexia, weight loss, fever, chills, fatigue, weakness. ENT: Negative for hoarseness, difficulty swallowing , nasal congestion. CV: Negative for chest pain, angina, palpitations, dyspnea on  exertion, peripheral edema.  Respiratory: Negative for dyspnea at rest, dyspnea on exertion, cough, sputum, wheezing.  GI: See history of present illness. GU:  Negative for dysuria, hematuria, urinary incontinence, urinary frequency, nocturnal urination.  Endo: Negative for unusual weight change.     Physical Exam   There were no vitals taken for this visit.   General: Well-nourished, well-developed in no acute distress.  Eyes: No icterus. Mouth: Oropharyngeal mucosa moist and pink , no lesions erythema or exudate. Lungs: Clear to auscultation bilaterally.  Heart: Regular rate and rhythm, no murmurs rubs or gallops.  Abdomen: Bowel sounds are normal, nontender, nondistended, no hepatosplenomegaly or masses,  no abdominal bruits or hernia , no rebound or guarding.  Rectal: ***  Extremities: No lower extremity edema. No clubbing or deformities. Neuro: Alert and oriented x 4   Skin: Warm and dry, no jaundice.   Psych: Alert and cooperative, normal mood and affect.  Labs   Lab Results  Component Value Date   NA 141 07/27/2023   CL 107 (H) 07/27/2023   K 4.4 07/27/2023   CO2 21 07/27/2023   BUN 34 (H) 07/27/2023   CREATININE 1.98 (H) 07/27/2023   EGFR 34 (L) 07/27/2023   CALCIUM 9.0 07/27/2023   ALBUMIN 2.8 (L) 07/27/2023   GLUCOSE 137 (H) 07/27/2023   Lab Results  Component Value Date   WBC 5.2 07/27/2023   HGB 11.2 (L) 07/27/2023   HCT 32.8 (L) 07/27/2023   MCV 107 (H) 07/27/2023   PLT 43 (LL) 07/27/2023   Lab Results  Component Value Date   ALT 32 07/27/2023   AST 47 (H) 07/27/2023   ALKPHOS 92 07/27/2023   BILITOT 5.1 (H) 07/27/2023   Lab Results  Component Value Date   INR 1.3 (H) 07/27/2023   INR 1.4 (H) 04/28/2023   INR 1.4 (H) 03/07/2023   AFP tumor marker6.2  Lab Results  Component Value Date   VITAMINB12 1,001 (H) 06/19/2023   Lab Results  Component Value Date   FOLATE 11.5 06/19/2023   Lab Results  Component Value Date   IRON 176 06/19/2023    TIBC 245 (L) 06/19/2023   FERRITIN 279 06/19/2023    Imaging Studies  No results found.  Assessment     Cirrhosis: with thrombocytopenia, portal HTN gastropathy.  He has been vaccinated for both Hep A and Hep B. More recent MELD of 20. History of decompensated disease with concern for hepatorenal syndrome and possible HCC but MRI could not be done due to elevated creatinine at that time. Patient elected to go home with comfort measures and had no further GI/hepatology evaluation until we saw him earlier in 2024. Follow up liver imaging without concern for suspicious liver lesions -update labs -due for hepatoma screening in 06/2023 -he has minimal lower ext edema today, just started couple of days ago. Will continue current diuretic regimen but may require increasing if persistent or worsening edema. -2 gram sodium diet   Anemia: likely multifactorial. Cannot exclude chronic occult GI bleeding given recent EGD findings. Followed by hematology. Iron sat 70, serum iron 149, ferritin 407. Plans for recheck in 05/2023.    Diarrhea: history of Cdiff. No longer on lactulose.  -recheck Cdiff GDH   Return ov in 07/2023.      PLAN   ***  MELD 3.0 of 24. Leanna Battles. Melvyn Neth, MHS, PA-C [facility]

## 2023-07-31 ENCOUNTER — Other Ambulatory Visit: Payer: Self-pay | Admitting: *Deleted

## 2023-07-31 ENCOUNTER — Encounter: Payer: Self-pay | Admitting: Gastroenterology

## 2023-07-31 ENCOUNTER — Ambulatory Visit (INDEPENDENT_AMBULATORY_CARE_PROVIDER_SITE_OTHER): Payer: Medicare HMO | Admitting: Gastroenterology

## 2023-07-31 VITALS — BP 112/57 | HR 59 | Temp 97.6°F | Ht 71.0 in | Wt 193.2 lb

## 2023-07-31 DIAGNOSIS — D649 Anemia, unspecified: Secondary | ICD-10-CM

## 2023-07-31 DIAGNOSIS — R7989 Other specified abnormal findings of blood chemistry: Secondary | ICD-10-CM | POA: Insufficient documentation

## 2023-07-31 DIAGNOSIS — K766 Portal hypertension: Secondary | ICD-10-CM | POA: Diagnosis not present

## 2023-07-31 DIAGNOSIS — K3189 Other diseases of stomach and duodenum: Secondary | ICD-10-CM

## 2023-07-31 DIAGNOSIS — K7460 Unspecified cirrhosis of liver: Secondary | ICD-10-CM

## 2023-07-31 DIAGNOSIS — A09 Infectious gastroenteritis and colitis, unspecified: Secondary | ICD-10-CM

## 2023-07-31 DIAGNOSIS — R197 Diarrhea, unspecified: Secondary | ICD-10-CM | POA: Diagnosis not present

## 2023-07-31 DIAGNOSIS — Z8619 Personal history of other infectious and parasitic diseases: Secondary | ICD-10-CM

## 2023-07-31 DIAGNOSIS — D696 Thrombocytopenia, unspecified: Secondary | ICD-10-CM

## 2023-07-31 DIAGNOSIS — R944 Abnormal results of kidney function studies: Secondary | ICD-10-CM | POA: Diagnosis not present

## 2023-07-31 NOTE — Patient Instructions (Signed)
 Referral to kidney specialist. Complete stool test. We will be in touch with results as available. You are due for updated liver ultrasound. We will get you scheduled.

## 2023-08-01 ENCOUNTER — Other Ambulatory Visit: Payer: Self-pay | Admitting: Gastroenterology

## 2023-08-01 DIAGNOSIS — D649 Anemia, unspecified: Secondary | ICD-10-CM | POA: Diagnosis not present

## 2023-08-01 DIAGNOSIS — A09 Infectious gastroenteritis and colitis, unspecified: Secondary | ICD-10-CM | POA: Diagnosis not present

## 2023-08-01 DIAGNOSIS — K7460 Unspecified cirrhosis of liver: Secondary | ICD-10-CM | POA: Diagnosis not present

## 2023-08-05 LAB — C DIFFICILE, CYTOTOXIN B

## 2023-08-05 LAB — C DIFFICILE TOXINS A+B W/RFLX: C difficile Toxins A+B, EIA: NEGATIVE

## 2023-08-10 ENCOUNTER — Ambulatory Visit (HOSPITAL_COMMUNITY)
Admission: RE | Admit: 2023-08-10 | Discharge: 2023-08-10 | Disposition: A | Discharge status: Home / Self Care | Source: Ambulatory Visit | Attending: Gastroenterology | Admitting: Gastroenterology

## 2023-08-10 DIAGNOSIS — K7460 Unspecified cirrhosis of liver: Secondary | ICD-10-CM | POA: Diagnosis not present

## 2023-08-10 DIAGNOSIS — R7989 Other specified abnormal findings of blood chemistry: Secondary | ICD-10-CM | POA: Insufficient documentation

## 2023-08-10 DIAGNOSIS — A09 Infectious gastroenteritis and colitis, unspecified: Secondary | ICD-10-CM | POA: Diagnosis not present

## 2023-08-10 DIAGNOSIS — K7689 Other specified diseases of liver: Secondary | ICD-10-CM | POA: Diagnosis not present

## 2023-08-10 DIAGNOSIS — K769 Liver disease, unspecified: Secondary | ICD-10-CM | POA: Diagnosis not present

## 2023-08-10 DIAGNOSIS — R188 Other ascites: Secondary | ICD-10-CM | POA: Diagnosis not present

## 2023-08-14 DIAGNOSIS — I10 Essential (primary) hypertension: Secondary | ICD-10-CM | POA: Diagnosis not present

## 2023-08-14 DIAGNOSIS — Z6828 Body mass index (BMI) 28.0-28.9, adult: Secondary | ICD-10-CM | POA: Diagnosis not present

## 2023-08-14 DIAGNOSIS — J40 Bronchitis, not specified as acute or chronic: Secondary | ICD-10-CM | POA: Diagnosis not present

## 2023-08-22 ENCOUNTER — Inpatient Hospital Stay: Admit: 2023-08-22 | Discharge: 2023-08-22 | Payer: Medicare (Managed Care) | Primary: Family Medicine

## 2023-08-22 DIAGNOSIS — M4646 Discitis, unspecified, lumbar region: Secondary | ICD-10-CM

## 2023-08-22 DIAGNOSIS — Z6829 Body mass index (BMI) 29.0-29.9, adult: Secondary | ICD-10-CM | POA: Diagnosis not present

## 2023-08-22 DIAGNOSIS — K7469 Other cirrhosis of liver: Secondary | ICD-10-CM | POA: Diagnosis not present

## 2023-08-22 DIAGNOSIS — E7220 Disorder of urea cycle metabolism, unspecified: Secondary | ICD-10-CM | POA: Diagnosis not present

## 2023-08-22 DIAGNOSIS — I10 Essential (primary) hypertension: Secondary | ICD-10-CM | POA: Diagnosis not present

## 2023-08-22 LAB — SEDIMENTATION RATE (ESR): BKR SEDIMENTATION RATE, ERYTHROCYTE: 6 mm/h (ref 0–20)

## 2023-08-22 LAB — C-REACTIVE PROTEIN     (CRP): BKR C-REACTIVE PROTEIN, HIGH SENSITIVITY: 2.6 mg/L

## 2023-08-23 NOTE — Other
 CRP up slightly.  ESR is low.  How is he feeling?

## 2023-08-23 NOTE — Other
 LM for pt to call back.

## 2023-08-24 ENCOUNTER — Encounter: Admit: 2023-08-24 | Payer: PRIVATE HEALTH INSURANCE | Primary: Family Medicine

## 2023-08-24 NOTE — Other
 Left message to call us.

## 2023-08-28 DIAGNOSIS — Z6830 Body mass index (BMI) 30.0-30.9, adult: Secondary | ICD-10-CM | POA: Diagnosis not present

## 2023-08-28 DIAGNOSIS — I10 Essential (primary) hypertension: Secondary | ICD-10-CM | POA: Diagnosis not present

## 2023-08-28 DIAGNOSIS — K7469 Other cirrhosis of liver: Secondary | ICD-10-CM | POA: Diagnosis not present

## 2023-08-28 DIAGNOSIS — Z0000 Encounter for general adult medical examination without abnormal findings: Secondary | ICD-10-CM | POA: Diagnosis not present

## 2023-08-31 ENCOUNTER — Emergency Department (HOSPITAL_COMMUNITY)

## 2023-08-31 ENCOUNTER — Encounter (HOSPITAL_COMMUNITY): Payer: Self-pay

## 2023-08-31 ENCOUNTER — Inpatient Hospital Stay (HOSPITAL_COMMUNITY)
Admission: EM | Admit: 2023-08-31 | Discharge: 2023-09-06 | DRG: 432 | Disposition: A | Discharge status: Skilled Nursing Facility | Source: Ambulatory Visit | Attending: Internal Medicine | Admitting: Internal Medicine

## 2023-08-31 ENCOUNTER — Other Ambulatory Visit: Payer: Self-pay

## 2023-08-31 DIAGNOSIS — K55069 Acute infarction of intestine, part and extent unspecified: Secondary | ICD-10-CM | POA: Diagnosis not present

## 2023-08-31 DIAGNOSIS — K7031 Alcoholic cirrhosis of liver with ascites: Secondary | ICD-10-CM | POA: Diagnosis not present

## 2023-08-31 DIAGNOSIS — D509 Iron deficiency anemia, unspecified: Secondary | ICD-10-CM | POA: Diagnosis not present

## 2023-08-31 DIAGNOSIS — I129 Hypertensive chronic kidney disease with stage 1 through stage 4 chronic kidney disease, or unspecified chronic kidney disease: Secondary | ICD-10-CM | POA: Diagnosis present

## 2023-08-31 DIAGNOSIS — D649 Anemia, unspecified: Secondary | ICD-10-CM

## 2023-08-31 DIAGNOSIS — R69 Illness, unspecified: Secondary | ICD-10-CM | POA: Diagnosis not present

## 2023-08-31 DIAGNOSIS — N1832 Chronic kidney disease, stage 3b: Secondary | ICD-10-CM | POA: Diagnosis not present

## 2023-08-31 DIAGNOSIS — L89152 Pressure ulcer of sacral region, stage 2: Secondary | ICD-10-CM | POA: Clinically undetermined

## 2023-08-31 DIAGNOSIS — K7290 Hepatic failure, unspecified without coma: Secondary | ICD-10-CM | POA: Diagnosis not present

## 2023-08-31 DIAGNOSIS — K766 Portal hypertension: Secondary | ICD-10-CM | POA: Diagnosis not present

## 2023-08-31 DIAGNOSIS — D684 Acquired coagulation factor deficiency: Secondary | ICD-10-CM | POA: Diagnosis present

## 2023-08-31 DIAGNOSIS — N179 Acute kidney failure, unspecified: Secondary | ICD-10-CM | POA: Diagnosis present

## 2023-08-31 DIAGNOSIS — Z66 Do not resuscitate: Secondary | ICD-10-CM | POA: Diagnosis present

## 2023-08-31 DIAGNOSIS — E8779 Other fluid overload: Secondary | ICD-10-CM | POA: Diagnosis present

## 2023-08-31 DIAGNOSIS — K7460 Unspecified cirrhosis of liver: Secondary | ICD-10-CM | POA: Diagnosis present

## 2023-08-31 DIAGNOSIS — Z87891 Personal history of nicotine dependence: Secondary | ICD-10-CM

## 2023-08-31 DIAGNOSIS — D61818 Other pancytopenia: Secondary | ICD-10-CM | POA: Diagnosis present

## 2023-08-31 DIAGNOSIS — N400 Enlarged prostate without lower urinary tract symptoms: Secondary | ICD-10-CM | POA: Diagnosis not present

## 2023-08-31 DIAGNOSIS — K7210 Chronic hepatic failure without coma: Secondary | ICD-10-CM | POA: Diagnosis not present

## 2023-08-31 DIAGNOSIS — R531 Weakness: Secondary | ICD-10-CM | POA: Diagnosis not present

## 2023-08-31 DIAGNOSIS — Z6830 Body mass index (BMI) 30.0-30.9, adult: Secondary | ICD-10-CM | POA: Diagnosis not present

## 2023-08-31 DIAGNOSIS — Z7401 Bed confinement status: Secondary | ICD-10-CM | POA: Diagnosis not present

## 2023-08-31 DIAGNOSIS — E8770 Fluid overload, unspecified: Secondary | ICD-10-CM | POA: Diagnosis present

## 2023-08-31 DIAGNOSIS — E785 Hyperlipidemia, unspecified: Secondary | ICD-10-CM | POA: Diagnosis present

## 2023-08-31 DIAGNOSIS — K3189 Other diseases of stomach and duodenum: Secondary | ICD-10-CM | POA: Diagnosis present

## 2023-08-31 DIAGNOSIS — R188 Other ascites: Principal | ICD-10-CM

## 2023-08-31 DIAGNOSIS — R601 Generalized edema: Secondary | ICD-10-CM | POA: Diagnosis not present

## 2023-08-31 DIAGNOSIS — K7040 Alcoholic hepatic failure without coma: Secondary | ICD-10-CM | POA: Diagnosis present

## 2023-08-31 DIAGNOSIS — R262 Difficulty in walking, not elsewhere classified: Secondary | ICD-10-CM | POA: Diagnosis present

## 2023-08-31 DIAGNOSIS — K7682 Hepatic encephalopathy: Secondary | ICD-10-CM | POA: Diagnosis not present

## 2023-08-31 DIAGNOSIS — R600 Localized edema: Secondary | ICD-10-CM | POA: Diagnosis not present

## 2023-08-31 DIAGNOSIS — I10 Essential (primary) hypertension: Secondary | ICD-10-CM | POA: Diagnosis not present

## 2023-08-31 DIAGNOSIS — K55059 Acute (reversible) ischemia of intestine, part and extent unspecified: Secondary | ICD-10-CM | POA: Diagnosis not present

## 2023-08-31 DIAGNOSIS — E8809 Other disorders of plasma-protein metabolism, not elsewhere classified: Secondary | ICD-10-CM | POA: Diagnosis present

## 2023-08-31 DIAGNOSIS — K7030 Alcoholic cirrhosis of liver without ascites: Secondary | ICD-10-CM | POA: Diagnosis not present

## 2023-08-31 DIAGNOSIS — D696 Thrombocytopenia, unspecified: Secondary | ICD-10-CM | POA: Diagnosis present

## 2023-08-31 DIAGNOSIS — E66811 Obesity, class 1: Secondary | ICD-10-CM | POA: Diagnosis present

## 2023-08-31 HISTORY — DX: Generalized edema: R60.1

## 2023-08-31 LAB — COMPREHENSIVE METABOLIC PANEL WITH GFR
ALT: 40 U/L (ref 0–44)
AST: 68 U/L — ABNORMAL HIGH (ref 15–41)
Albumin: 2.3 g/dL — ABNORMAL LOW (ref 3.5–5.0)
Alkaline Phosphatase: 77 U/L (ref 38–126)
Anion gap: 7 (ref 5–15)
BUN: 42 mg/dL — ABNORMAL HIGH (ref 8–23)
CO2: 23 mmol/L (ref 22–32)
Calcium: 8.4 mg/dL — ABNORMAL LOW (ref 8.9–10.3)
Chloride: 105 mmol/L (ref 98–111)
Creatinine, Ser: 2.5 mg/dL — ABNORMAL HIGH (ref 0.61–1.24)
GFR, Estimated: 26 mL/min — ABNORMAL LOW (ref >60)
Glucose, Bld: 215 mg/dL — ABNORMAL HIGH (ref 70–99)
Potassium: 4.2 mmol/L (ref 3.5–5.1)
Sodium: 135 mmol/L (ref 135–145)
Total Bilirubin: 3.6 mg/dL — ABNORMAL HIGH (ref 0.0–1.2)
Total Protein: 5.2 g/dL — ABNORMAL LOW (ref 6.5–8.1)

## 2023-08-31 LAB — CBC WITH DIFFERENTIAL/PLATELET
Abs Immature Granulocytes: 0.01 10*3/uL (ref 0.00–0.07)
Basophils Absolute: 0 10*3/uL (ref 0.0–0.1)
Basophils Relative: 1 %
Eosinophils Absolute: 0.1 10*3/uL (ref 0.0–0.5)
Eosinophils Relative: 2 %
HCT: 29.8 % — ABNORMAL LOW (ref 39.0–52.0)
Hemoglobin: 10.1 g/dL — ABNORMAL LOW (ref 13.0–17.0)
Immature Granulocytes: 0 %
Lymphocytes Relative: 24 %
Lymphs Abs: 0.8 10*3/uL (ref 0.7–4.0)
MCH: 37.8 pg — ABNORMAL HIGH (ref 26.0–34.0)
MCHC: 33.9 g/dL (ref 30.0–36.0)
MCV: 111.6 fL — ABNORMAL HIGH (ref 80.0–100.0)
Monocytes Absolute: 0.5 10*3/uL (ref 0.1–1.0)
Monocytes Relative: 13 %
Neutro Abs: 2.1 10*3/uL (ref 1.7–7.7)
Neutrophils Relative %: 60 %
Platelets: 47 10*3/uL — ABNORMAL LOW (ref 150–400)
RBC: 2.67 MIL/uL — ABNORMAL LOW (ref 4.22–5.81)
RDW: 16 % — ABNORMAL HIGH (ref 11.5–15.5)
WBC: 3.5 10*3/uL — ABNORMAL LOW (ref 4.0–10.5)
nRBC: 0 % (ref 0.0–0.2)

## 2023-08-31 LAB — PROTIME-INR
INR: 1.6 — ABNORMAL HIGH (ref 0.8–1.2)
Prothrombin Time: 19.2 s — ABNORMAL HIGH (ref 11.4–15.2)

## 2023-08-31 MED ORDER — ALFUZOSIN HCL ER 10 MG PO TB24
10.0000 mg | ORAL_TABLET | Freq: Every day | ORAL | Status: DC
  Administered 2023-09-01 – 2023-09-06 (×6): 10 mg via ORAL
  Filled 2023-08-31 (×6): qty 1

## 2023-08-31 MED ORDER — FERROUS SULFATE 325 (65 FE) MG PO TABS
325.0000 mg | ORAL_TABLET | Freq: Every day | ORAL | Status: DC
  Administered 2023-09-01 – 2023-09-06 (×6): 325 mg via ORAL
  Filled 2023-08-31 (×7): qty 1

## 2023-08-31 MED ORDER — ALBUMIN HUMAN 25 % IV SOLN
12.5000 g | Freq: Once | INTRAVENOUS | Status: AC
  Administered 2023-08-31: 12.5 g via INTRAVENOUS
  Filled 2023-08-31: qty 50

## 2023-08-31 MED ORDER — ACETAMINOPHEN 650 MG RE SUPP: 650.0000 mg | Freq: Four times a day (QID) | RECTAL | Status: DC | PRN

## 2023-08-31 MED ORDER — MIDODRINE HCL 5 MG PO TABS
5.0000 mg | ORAL_TABLET | Freq: Three times a day (TID) | ORAL | Status: DC
  Administered 2023-09-01: 5 mg via ORAL
  Filled 2023-08-31: qty 1

## 2023-08-31 MED ORDER — PROPRANOLOL HCL 20 MG PO TABS
10.0000 mg | ORAL_TABLET | Freq: Two times a day (BID) | ORAL | Status: DC
  Administered 2023-09-01 – 2023-09-06 (×12): 10 mg via ORAL
  Filled 2023-08-31 (×12): qty 1

## 2023-08-31 MED ORDER — FUROSEMIDE 10 MG/ML IJ SOLN
60.0000 mg | Freq: Two times a day (BID) | INTRAMUSCULAR | Status: DC
  Administered 2023-09-01 – 2023-09-02 (×3): 60 mg via INTRAVENOUS
  Filled 2023-08-31 (×3): qty 6

## 2023-08-31 MED ORDER — ONDANSETRON HCL 4 MG PO TABS: 4.0000 mg | ORAL_TABLET | Freq: Four times a day (QID) | ORAL | Status: DC | PRN

## 2023-08-31 MED ORDER — GABAPENTIN 100 MG PO CAPS
100.0000 mg | ORAL_CAPSULE | Freq: Every day | ORAL | Status: DC
  Administered 2023-09-01 – 2023-09-05 (×6): 100 mg via ORAL
  Filled 2023-08-31 (×6): qty 1

## 2023-08-31 MED ORDER — LACTULOSE 10 GM/15ML PO SOLN
10.0000 g | Freq: Three times a day (TID) | ORAL | Status: DC
  Administered 2023-09-01 – 2023-09-03 (×7): 10 g via ORAL
  Filled 2023-08-31 (×7): qty 30

## 2023-08-31 MED ORDER — ONDANSETRON HCL 4 MG/2ML IJ SOLN: 4.0000 mg | Freq: Four times a day (QID) | INTRAMUSCULAR | Status: DC | PRN

## 2023-08-31 MED ORDER — TRAZODONE HCL 50 MG PO TABS
50.0000 mg | ORAL_TABLET | Freq: Every day | ORAL | Status: DC
  Administered 2023-09-01 – 2023-09-05 (×6): 50 mg via ORAL
  Filled 2023-08-31 (×6): qty 1

## 2023-08-31 MED ORDER — ACETAMINOPHEN 325 MG PO TABS
650.0000 mg | ORAL_TABLET | Freq: Four times a day (QID) | ORAL | Status: DC | PRN
  Administered 2023-09-02: 650 mg via ORAL
  Filled 2023-08-31: qty 2

## 2023-08-31 MED ORDER — HEPARIN SODIUM (PORCINE) 5000 UNIT/ML IJ SOLN
5000.0000 [IU] | Freq: Three times a day (TID) | INTRAMUSCULAR | Status: DC
  Administered 2023-08-31 – 2023-09-01 (×3): 5000 [IU] via SUBCUTANEOUS
  Filled 2023-08-31 (×3): qty 1

## 2023-08-31 MED ORDER — FUROSEMIDE 10 MG/ML IJ SOLN
40.0000 mg | Freq: Once | INTRAMUSCULAR | Status: AC
  Administered 2023-08-31: 40 mg via INTRAVENOUS
  Filled 2023-08-31: qty 4

## 2023-08-31 MED ORDER — SPIRONOLACTONE 25 MG PO TABS
25.0000 mg | ORAL_TABLET | Freq: Every day | ORAL | Status: DC
  Administered 2023-09-01 – 2023-09-02 (×2): 25 mg via ORAL
  Filled 2023-08-31 (×2): qty 1

## 2023-08-31 MED ORDER — SODIUM BICARBONATE 650 MG PO TABS
650.0000 mg | ORAL_TABLET | Freq: Two times a day (BID) | ORAL | Status: DC
  Administered 2023-09-01 – 2023-09-06 (×12): 650 mg via ORAL
  Filled 2023-08-31 (×12): qty 1

## 2023-08-31 NOTE — Assessment & Plan Note (Signed)
 Currently of anticoagulation Lower extremity ultrasound negative for deep vein thrombosis.

## 2023-08-31 NOTE — ED Triage Notes (Signed)
 Pt arrived via POV from his PCP office today. Pt reports Dr. Jannis Merchant A. Hasanaj, MD wanted the Pt evaluated for bilateral leg swelling, IV Lasix  and IV Albumin.

## 2023-08-31 NOTE — ED Notes (Addendum)
 This nurse assisted with putting a gown on pt and on the monitor. Pt is A&O x4 at this time. Swelling seen in abdomen and lower extremities. EDP at bedside.

## 2023-08-31 NOTE — ED Provider Notes (Signed)
 [place] EMERGENCY DEPARTMENT AT [facility] Provider Note   CSN: [id number] Arrival date & time: 08/31/23  1426     History  Chief Complaint  Patient presents with   Leg Swelling    Mike Moreno is a 78 y.o. male who presents to the emergency department today for a chief complaint of leg swelling.  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  Patient states that overnight he attempted to elevate his legs to reduce swelling without success.  Patient states that this morning he went to his primary care provider office due to swelling who recommended he come to the emergency department for possible admission for diuresis.  Patient has a past medical history significant for liver cirrhosis with anasarca, superior mesenteric vein thrombosis, hypertension, and hyperlipidemia.  Patient has handwritten note in hand from PCP office stating patient has a history of liver cirrhosis with anasarca, need admission for IV albumin  and IV Lasix .  Patient denies chest pain, shortness of breath, abdominal pain.  HPI     Home Medications Prior to Admission medications   Medication Sig Start Date End Date Taking? Authorizing Provider  alfuzosin  (UROXATRAL ) 10 MG 24 hr tablet Take 10 mg by mouth daily. 04/16/22   [provider]  bumetanide (BUMEX) 1 MG tablet Take 1 mg by mouth daily.    [provider]  gabapentin  (NEURONTIN ) 100 MG capsule Take 100 mg by mouth at bedtime.    [provider]  midodrine  (PROAMATINE ) 5 MG tablet Take 5 mg by mouth 3 (three) times daily. 05/28/22   [provider]  nitroGLYCERIN (NITROSTAT) 0.4 MG SL tablet Place 0.4 mg under the tongue as needed for chest pain.    [provider]  propranolol  (INDERAL ) 10 MG tablet Take 10 mg by mouth 2 (two) times daily. 05/23/22   [provider]  sodium  bicarbonate 650 MG tablet Take 650 mg by mouth 2 (two) times daily. 05/10/22   [provider]  spironolactone  (ALDACTONE ) 25 MG tablet Take 25 mg by mouth daily. 04/04/22   [provider]  traZODone  (DESYREL ) 50 MG tablet Take 50 mg by mouth at bedtime. 05/30/23   [provider]      Allergies    Patient has no known allergies.    Review of Systems   Review of Systems  Constitutional:  Negative for chills and fever.  Respiratory:  Negative for chest tightness and shortness of breath.   Cardiovascular:  Positive for leg swelling. Negative for chest pain and palpitations.  Gastrointestinal:  Positive for abdominal distention. Negative for abdominal pain, nausea and vomiting.  Genitourinary:  Negative for difficulty urinating.  Skin:  Negative for wound.  Neurological:  Negative for seizures, syncope and facial asymmetry.    Physical Exam Updated Vital Signs BP (!) 119/52 (BP Location: Right Arm)   Pulse (!) 59   Temp 97.8 F (36.6 C) (Oral)   Resp 16   Ht 5\' 11"  (1.803 m)   Wt 87.6 kg   SpO2 99%   BMI 26.94 kg/m  Physical Exam Vitals and nursing note reviewed.  Constitutional:      General: He is awake. He is not in acute distress.    Appearance: He is well-developed. He is not ill-appearing, toxic-appearing or diaphoretic.  HENT:     Head: Normocephalic and atraumatic.  Eyes:  General: No scleral icterus.    Extraocular Movements: Extraocular movements intact.     Conjunctiva/sclera: Conjunctivae normal.     Pupils: Pupils are equal, round, and reactive to light.  Cardiovascular:     Rate and Rhythm: Normal rate and regular rhythm.     Heart sounds: No murmur heard. Pulmonary:     Effort: Pulmonary effort is normal. No respiratory distress.     Breath sounds: Normal breath sounds. No wheezing, rhonchi or rales.  Abdominal:     General: There is distension.     Palpations: Abdomen is soft.     Tenderness: There is no abdominal  tenderness. There is no guarding.     Comments: Anasarca present, edema all the way up to the level of the xiphoid.  Musculoskeletal:        General: Swelling present.     Right lower leg: Edema present.     Left lower leg: Edema present.  Feet:     Comments: Bilateral 2+ pitting edema present of R and L lower extremity, at time of exam patient in jeans and t-shirt in triage room. Plan to reassess once patient has been roomed and in hospital gown. Patient states that swelling extends up into thighs, groin, scrotum, and abdomen.   Sensation and motor function of BL lower extremities intact.   Reassessment significant for bilateral symmetrical bruising found on calf, back of knee, and thigh, patient denies calf tenderness Skin:    General: Skin is warm and dry.     Capillary Refill: Capillary refill takes less than 2 seconds.  Neurological:     General: No focal deficit present.     Mental Status: He is alert and oriented to person, place, and time.  Psychiatric:        Mood and Affect: Mood normal.        Behavior: Behavior normal. Behavior is cooperative.     ED Results / Procedures / Treatments   Labs (all labs ordered are listed, but only abnormal results are displayed) Labs Reviewed - No data to display  EKG None  Radiology No results found.  Procedures Procedures    Medications Ordered in ED Medications - No data to display  ED Course/ Medical Decision Making/ A&P    Patient presents to the ED for concern of leg swelling this involves an extensive number of treatment options, and is a complaint that carries with it a high risk of complications and morbidity.  The differential diagnosis includes CHF exacerbation, DVT, edema, cirrhosis, etc.   Co morbidities that complicate the patient evaluation  Liver cirrhosis with anasarca, hypertension, history of superior mesenteric vein thrombosis   Additional history obtained:  Additional history obtained from Past  Admission   External records from outside source obtained and reviewed including previous admission H&P and discharge summary   Lab Tests:  I Ordered, and personally interpreted labs.  The pertinent results include:  CBC -white blood cell count 3.5, hemoglobin 10.1, HCT 29.8, platelets 47   CMP -glucose 215, BUN 42, creatinine 2.5, calcium 8.4, albumin  2.3, AST 68    PT/INR -PT 19.2, INR 1.6   Imaging Studies ordered:  I ordered imaging studies including bilateral lower extremity ultrasound for DVT I independently visualized and interpreted imaging which showed no evidence of DVT in either extremity I agree with the radiologist interpretation   Cardiac Monitoring:  The patient was maintained on a cardiac monitor.  I personally viewed and interpreted the cardiac monitored which showed an  underlying rhythm of: irregular - EKG ordered, sinus arrhythmia, patient denies chest pain   Medicines ordered and prescription drug management:  I ordered medication including Lasix  for edema, albumin  for edema in presence of liver cirrhosis Reevaluation of the patient after these medicines showed that the patient stayed the same I have reviewed the patients home medicines and have made adjustments as needed   Test Considered:  BNP-declined, patient is clearly fluid overloaded with bilateral 2+ lower extremity edema, as well as anasarca, would not have changed clinical approach.   Critical Interventions:  none   Problem List / ED Course:  Leg swelling in the presence of liver cirrhosis with anasarca Initial exam completed in vertical triage.  Exam significant for extensive bilateral lower extremity edema, 2+ pitting.  Patient denies chest pain, shortness of breath, abdominal pain.  Abdomen distended. Patient reassessed once in triage room in hospital gown.  Extensive bilateral lower extremity edema up into the thigh, groin, and scrotum.  Bilateral symmetrical bruising present on lower  extremities, DVT ultrasound studies ordered due to prior clot. IV Lasix  ordered for edema, as well as albumin  Bilateral lower extremity ultrasound significant for no evidence of DVT in either extremity Patient admitted via hospitalist consult for ongoing diuresis and management   Reevaluation:  After the interventions noted above, I reevaluated the patient and found that they have :stayed the same   Social Determinants of Health:  none   Dispostion:  After consideration of the diagnostic results and the patients response to treatment, I feel that the patent would benefit from admission to the hospital for ongoing diuresis and albumin  replacement.  Patient has a significant past medical history of liver cirrhosis with anasarca.  On physical exam today significant 2+ bilateral pitting edema extending up into the thigh groin and scrotum.  As well as distended abdomen.  Attending physician Dr. Annabell Key spoke with hospitalist, patient admitted.  Click here for ABCD2, HEART and other calculatorsREFRESH Note before signing :1}                              Medical Decision Making Amount and/or Complexity of Data Reviewed Labs: ordered.  Risk Prescription drug management.          Final Clinical Impression(s) / ED Diagnoses Final diagnoses:  None    Rx / DC Orders ED Discharge Orders     None         Susanne Epps 08/31/23 2107    Early Glisson, MD 09/06/23 1210

## 2023-08-31 NOTE — Assessment & Plan Note (Signed)
 Plan to continue IV furosemide  for volume overload.  Continue close follow up on renal function and electrolytes.

## 2023-08-31 NOTE — ED Provider Notes (Signed)
 This patient is a 78 year old male history of liver failure and cirrhosis presenting with a complaint of anasarca peripheral swelling which has become quite severe.  His family doctor sent him to the ED for admission and IV diuresis.  On exam he has anasarca extending up to above the umbilicus, significant bilateral symmetrical lower extremity edema with a negative DVT study.  Labs show chronic anemia, chronic thrombocytopenia, renal function is slightly worse than usual with an acute kidney injury.  I discussed his case with Dr. Sunnie England who will admit   Early Glisson, MD 08/31/23 [phone]

## 2023-08-31 NOTE — Assessment & Plan Note (Signed)
 Thrombocytopenia, leukopenia.  Iron deficiency anemia.  Pancytopenia  Plan to continue close follow up of cell count.  No current indication for blood products transfusion

## 2023-08-31 NOTE — Assessment & Plan Note (Addendum)
 Cirrhosis with portal hypertension  Acute on chronic liver failure.   Plan for IV diuresis with furosemide  60 mg IV bid and continue spironolactone Continue lactulose 10 g tid Neuro checks per unit protocol.  Continue blood pressure support with midodrine and non selective B blocker with propranolol.  Check abdominal ultrasound for ascites, may need paracentesis.

## 2023-08-31 NOTE — H&P (Signed)
 History and Physical    Patient: Mike Moreno YNW:[id number] DOB: [birth year] DOA: 08/31/2023 DOS: the patient was seen and examined on 08/31/2023 PCP: Veda Gerald, MD  Patient coming from: Home  Chief Complaint:  Chief Complaint  Patient presents with   Leg Swelling   HPI: Mike Moreno is a 78 y.o. male with medical history significant of hepatic cirrhosis, non occlusive SMV thrombosis, hypertension, hyperlipidemia and neuropathy who presented with lower extremity edema.  Patient reported rapidly worsening lower extremity edema over the last 24 hrs, extending into his thighs, genitals and lower abdomen. No dyspnea or chest pain.  No confusion or agitation.  Noted easy bruising upper and lower extremities.    Because of worsening symptoms today he was evaluated by his primary care physician who recommended hospitalization for further management.   At home patient is on spironolactone and bumetanide at home for diuresis. He has been compliant with his medications.   07/2023 outpatient GI follow up noted with stable cirrhosis with no volume overload.    Review of Systems: As mentioned in the history of present illness. All other systems reviewed and are negative. Past Medical History:  Diagnosis Date   Anasarca    BPH (benign prostatic hyperplasia)    Cirrhosis (HCC)    HTN (hypertension)    Hyperlipidemia    Neuropathy    Past Surgical History:  Procedure Laterality Date   bilateral inguinal hernias     BIOPSY  12/07/2016   Procedure: BIOPSY;  Surgeon: Suzette Espy, MD;  [facility]: [facility];  Service: Endoscopy;;  ileocecal valve   BIOPSY  03/29/2023   Procedure: BIOPSY;  Surgeon: Suzette Espy, MD;  [facility]: [facility];  Service: Endoscopy;;   COLONOSCOPY  08/2011   According to [facility] notes, 2 tubular adenomatous colon polyps removed next surveillance colonoscopy in 5 years   COLONOSCOPY N/A 12/07/2016   Procedure: COLONOSCOPY;  Surgeon: Suzette Espy,  MD;  Location: [facility];  Service: Endoscopy;  Laterality: N/A;  1030    COLONOSCOPY WITH PROPOFOL  N/A 06/22/2022   Procedure: COLONOSCOPY WITH PROPOFOL ;  Surgeon: Suzette Espy, MD;  Location: [facility];  Service: Endoscopy;  Laterality: N/A;  1:45 pm, pt can't move transportation   ESOPHAGOGASTRODUODENOSCOPY (EGD) WITH PROPOFOL  N/A 03/29/2023   Procedure: ESOPHAGOGASTRODUODENOSCOPY (EGD) WITH PROPOFOL ;  Surgeon: Suzette Espy, MD;  Location: [facility];  Service: Endoscopy;  Laterality: N/A;  2:00 pm, asa 3   HAND SURGERY     traumatic injury   POLYPECTOMY  12/07/2016   Procedure: POLYPECTOMY;  Surgeon: Suzette Espy, MD;  [facility]: [facility];  Service: Endoscopy;;  colon   POLYPECTOMY  06/22/2022   Procedure: POLYPECTOMY;  Surgeon: Suzette Espy, MD;  [facility]: [facility];  Service: Endoscopy;;   UMBILICAL HERNIA REPAIR     Social History:  reports that he has quit smoking. His smoking use included cigarettes and cigars. He has never used smokeless tobacco. He reports that he does not drink alcohol and does not use drugs.  No Known Allergies  Family History  Problem Relation Age of Onset   Cirrhosis Brother    Colon cancer Neg Hx     Prior to Admission medications   Medication Sig Start Date End Date Taking? Authorizing Provider  alfuzosin (UROXATRAL) 10 MG 24 hr tablet Take 10 mg by mouth daily. 04/16/22  Yes [provider]  bumetanide (BUMEX) 1 MG tablet Take 1 mg by  mouth daily.   Yes [provider]  CONSTULOSE 10 GM/15ML solution Take 10 g by mouth 2 (two) times daily as needed for mild constipation. 08/29/23  Yes [provider]  ferrous sulfate  325 (65 FE) MG EC tablet Take 325 mg by mouth daily with breakfast. 08/04/23  Yes [provider]  gabapentin (NEURONTIN) 100 MG capsule Take 100 mg by mouth at bedtime.   Yes [provider]  midodrine (PROAMATINE) 5 MG tablet Take 5 mg by mouth 3 (three) times daily.  05/28/22  Yes [provider]  neomycin (MYCIFRADIN) 500 MG tablet Take 500 mg by mouth 3 (three) times daily. 08/29/23  Yes [provider]  nitroGLYCERIN (NITROSTAT) 0.4 MG SL tablet Place 0.4 mg under the tongue as needed for chest pain.   Yes [provider]  promethazine (PHENERGAN) 6.25 MG/5ML solution Take 6.25 mg by mouth 4 (four) times daily. 08/14/23  Yes [provider]  propranolol (INDERAL) 10 MG tablet Take 10 mg by mouth 2 (two) times daily. 05/23/22  Yes [provider]  sodium bicarbonate 650 MG tablet Take 650 mg by mouth 2 (two) times daily. 05/10/22  Yes [provider]  spironolactone (ALDACTONE) 25 MG tablet Take 25 mg by mouth daily. 04/04/22  Yes [provider]  traZODone (DESYREL) 50 MG tablet Take 50 mg by mouth at bedtime. 05/30/23  Yes [provider]    Physical Exam: Vitals:   08/31/23 1615 08/31/23 1630 08/31/23 1700 08/31/23 2115  BP: 129/61 120/84 (!) 119/51 (!) 117/43  Pulse: 62 61 60 (!) 59  Resp:  16 16 18   Temp:    98.3 F (36.8 C)  TempSrc:    Oral  SpO2: 98% 99% 99% 98%  Weight:      Height:       Neurology awake and alert, mild asterixis  ENT with mild pallor and mild icterus Cardiovascular with S1 and S2 present and regular with no gallops, rubs or murmurs Respiratory with no rales or wheezing, no rhonchi  Abdomen with distention and mild dullness to percussion at the dependent zones.  Positive lower extremity edema, pitting +++ up to the thighs,  Noted mild erythema at the inner regions of bilateral knees.   Data Reviewed:   Na 135, K 4,2 Cl 105 bicarbonate 23 glucose 215 bun 42 cr 2,5  AST 68 ALT 40  Wbc 3,5 hgb 10.1 plt 47  Lower extremities doppler ultrasound with no evidence of deep venous thrombosis in either lower extremity   EKG 60 bpm, normal axis, normal intervals, qtc 423, sinus rhythm with no significant ST segment or T wave changes.   Assessment and Plan: *  Hepatic failure (HCC) Cirrhosis with portal hypertension  Acute on chronic liver failure.   Plan for IV diuresis with furosemide  60 mg IV bid and continue spironolactone Continue lactulose 10 g tid Neuro checks per unit protocol.  Continue blood pressure support with midodrine and non selective B blocker with propranolol.  Check abdominal ultrasound for ascites, may need paracentesis.   Acute kidney injury superimposed on stage 3b chronic kidney disease (HCC) Plan to continue IV furosemide  for volume overload.  Continue close follow up on renal function and electrolytes.   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   Anemia Thrombocytopenia, leukopenia.  Iron deficiency anemia.  Pancytopenia  Plan to continue close follow up of cell count.  No current indication for blood products  transfusion       Advance Care Planning:   Code Status: Full Code   Consults: none   Family Communication: no family at the bedside   Severity of Illness: The appropriate patient status for this patient is INPATIENT. Inpatient status is judged to be reasonable and necessary in order to provide the required intensity of service to ensure the patient's safety. The patient's presenting symptoms, physical exam findings, and initial radiographic and laboratory data in the context of their chronic comorbidities is felt to place them at high risk for further clinical deterioration. Furthermore, it is not anticipated that the patient will be medically stable for discharge from the hospital within 2 midnights of admission.   * I certify that at the point of admission it is my clinical judgment that the patient will require inpatient hospital care spanning beyond 2 midnights from the point of admission due to high intensity of service, high risk for further deterioration and high frequency of surveillance  required.*  Author: Albertus Alt, MD 08/31/2023 11:12 PM  For on call review [email or web address].

## 2023-09-01 ENCOUNTER — Inpatient Hospital Stay (HOSPITAL_COMMUNITY)

## 2023-09-01 DIAGNOSIS — K7210 Chronic hepatic failure without coma: Secondary | ICD-10-CM | POA: Diagnosis not present

## 2023-09-01 LAB — CBC
HCT: 24.6 % — ABNORMAL LOW (ref 39.0–52.0)
Hemoglobin: 8.4 g/dL — ABNORMAL LOW (ref 13.0–17.0)
MCH: 37.3 pg — ABNORMAL HIGH (ref 26.0–34.0)
MCHC: 34.1 g/dL (ref 30.0–36.0)
MCV: 109.3 fL — ABNORMAL HIGH (ref 80.0–100.0)
Platelets: 39 10*3/uL — ABNORMAL LOW (ref 150–400)
RBC: 2.25 MIL/uL — ABNORMAL LOW (ref 4.22–5.81)
RDW: 15.8 % — ABNORMAL HIGH (ref 11.5–15.5)
WBC: 3.3 10*3/uL — ABNORMAL LOW (ref 4.0–10.5)
nRBC: 0 % (ref 0.0–0.2)

## 2023-09-01 LAB — BASIC METABOLIC PANEL WITH GFR
Anion gap: 5 (ref 5–15)
BUN: 40 mg/dL — ABNORMAL HIGH (ref 8–23)
CO2: 24 mmol/L (ref 22–32)
Calcium: 7.9 mg/dL — ABNORMAL LOW (ref 8.9–10.3)
Chloride: 106 mmol/L (ref 98–111)
Creatinine, Ser: 2.34 mg/dL — ABNORMAL HIGH (ref 0.61–1.24)
GFR, Estimated: 28 mL/min — ABNORMAL LOW (ref >60)
Glucose, Bld: 97 mg/dL (ref 70–99)
Potassium: 4 mmol/L (ref 3.5–5.1)
Sodium: 135 mmol/L (ref 135–145)

## 2023-09-01 MED ORDER — MIDODRINE HCL 5 MG PO TABS
10.0000 mg | ORAL_TABLET | Freq: Three times a day (TID) | ORAL | Status: DC
Start: 2023-09-01 — End: 2023-09-06
  Administered 2023-09-01 – 2023-09-06 (×16): 10 mg via ORAL
  Filled 2023-09-01 (×16): qty 2

## 2023-09-01 NOTE — Progress Notes (Signed)
 PROGRESS NOTE Mike Moreno  ZOX:[id number] DOB: [birth year] DOA: 08/31/2023 PCP: Veda Gerald, MD  Brief Narrative/Hospital Course: 27 yom w/ significant medical history including hepatic cirrhosis, non occlusive SMV thrombosis, htn hld and neuropathy who presented with lower extremity edema.  Patient reported rapidly worsening lower extremity edema over the last 24 hrs PTA extending into his thighs, genitals and lower abdomen. Noted easy bruising upper and lower extremities.  Seen by PCP and recommended hospitalization- he was on spironolactone  and bumetanide at home for diuresis 07/2023 outpatient GI follow up noted with stable cirrhosis with no volume overload.  In the ED: BP 119 stable afebrile not hypoxic.  Labs showed Na 135, K 4,2 Cl 105 bicarbonate 23 glucose 215 bun 42 cr 2,5  AST 68 ALT 40  Wbc 3,5 hgb 10.1 plt 47 Lower extremities doppler ultrasound with no evidence of deep venous thrombosis in either lower extremity  EKG NSR.  Subjective: Seen and examined Alert awake Denies abdominal pain.  Nausea vomiting fever chills.   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 Continue IV diuresis as tolerated by BP, holding Aldactone  today due to soft BP  Continue lactulose  10 g tid and monitor for asterixis Cont midodrine . Hold propranolol  if bp soft.  US  ABD pending for ascites-may need paracentesis if does not improve with Lasix . INR 1.6, monitor MELD 3 score 27 Recent  Labs  Lab 08/31/23 1548 09/01/23 0256  AST 68*  --   ALT 40  --   ALKPHOS 77  --   BILITOT 3.6*  --   PROT 5.2*  --   ALBUMIN  2.3*  --   INR 1.6*  --   PLT 47* 39*     MELD 3.0: 27 at 09/01/2023  2:56 AM MELD-Na: 26 at 09/01/2023  2:56 AM Calculated from: Serum Creatinine: 2.34 mg/dL at 11/23/1912  7:82 AM Serum Sodium: 135 mmol/L at 09/01/2023  2:56 AM Total Bilirubin: 3.6 mg/dL at 12/29/6211  0:86 PM Serum Albumin : 2.3 g/dL at 08/30/8467  6:29 PM INR(ratio): 1.6 at 08/31/2023  3:48 PM Age at listing (hypothetical): 81 years Sex: Male at 09/01/2023  2:56 AM     Aki on CKD 3b: B/l creat ~ 2. Cont V furosemide  for volume overload. And creat slightly better today Monitor renal function and urine output closely Recent Labs    01/09/23 1241 01/12/23 1418 01/30/23 1339 02/21/23 1057 03/07/23 1345 04/28/23 1032 06/19/23 0926 07/27/23 1333 08/31/23 1548 09/01/23 0256  BUN 75* 54* 24 36* 29* 20 45* 34* 42* 40*  CREATININE 3.25* 1.89* 1.49* 1.96* 1.60* 1.54* 2.01* 1.98* 2.50* 2.34*  CO2 16* 22 21 23 22  18* 23 21 23 24   K 3.7 3.5 4.3 4.0 3.9 4.7 3.4* 4.4 4.2 4.0     Superior mesenteric vein thrombosis: Currently off anticoagulation.  Dopplers negative for DVT  Pancytopenia with anemia thrombocytopenia leukopenia Iron deficiency anemia : Patient with pancytopenia in the setting of liver cirrhosis.  Monitor CBC closely transfuse hemoglobin of less than 7.transfuse platelet if active bleeding Recent Labs  Lab  08/31/23 1548 09/01/23 0256  HGB 10.1* 8.4*  HCT 29.8* 24.6*  WBC 3.5* 3.3*  PLT 47* 39*    Ambulatory dysfunction/debility/deconditioning: Due to his worsening leg swelling having difficulty with ambulation, Continue diuresis, PT OT eval f   DVT prophylaxis: heparin  injection 5,000 Units Start: 08/31/23 2200 SCDs Start: 08/31/23 1912 Code Status:   Code Status: Full Code Family Communication: plan of care discussed with patient  at bedside. Patient status is: Remains  hospitalized because of severity of illness Level of care: Med-Surg   Dispo: The patient is from: home            Anticipated disposition: TBD Objective: Vitals last 24 hrs: Vitals:   08/31/23 2115 09/01/23 0039 09/01/23 0611 09/01/23 0822  BP: (!) 117/43 (!) 113/59 109/62 (!) 107/54  Pulse: (!) 59 63 67 64  Resp: 18 18 20    Temp: 98.3 F (36.8 C) 97.8 F (36.6 C) 99.1 F (37.3 C) 98 F (36.7 C)  TempSrc: Oral  Oral Oral  SpO2: 98% 99% 93% 96%  Weight:      Height:        Physical Examination: General exam: alert awake, older than stated age HEENT:Oral mucosa moist, Ear/Nose WNL grossly Respiratory system: Bilaterally clear BS, no use of accessory muscle Cardiovascular system: S1 & S2 +. Gastrointestinal system: Abdomen soft, moderately distended NT,BS+ Nervous System: Alert, awake,  following commands. Extremities: LE edema +++, warm extremities Skin: No rashes,warm. MSK: Normal muscle bulk/tone.   Data Reviewed: I have personally reviewed following labs and imaging studies ( see epic result tab) CBC: Recent Labs  Lab 08/31/23 1548 09/01/23 0256  WBC 3.5* 3.3*  NEUTROABS 2.1  --   HGB 10.1* 8.4*  HCT 29.8* 24.6*  MCV 111.6* 109.3*  PLT 47* 39*   CMP: Recent Labs  Lab 08/31/23 1548 09/01/23 0256  NA 135 135  K 4.2 4.0  CL 105 106  CO2 23 24  GLUCOSE 215* 97  BUN 42* 40*  CREATININE 2.50* 2.34*  CALCIUM 8.4* 7.9*   GFR: Estimated Creatinine Clearance: 28.2 mL/min (A) (by C-G formula based on SCr of 2.34 mg/dL (H)). Recent Labs  Lab 08/31/23 1548  AST 68*  ALT 40  ALKPHOS 77  BILITOT 3.6*  PROT 5.2*  ALBUMIN  2.3*   No results for input(s): "LIPASE", "AMYLASE" in the last 168 hours. No results for input(s): "AMMONIA" in the last 168 hours. Coagulation Profile:  Recent Labs  Lab 08/31/23 1548  INR 1.6*   Unresulted Labs (From admission, onward)    None      Antimicrobials/Microbiology: Anti-infectives (From admission, onward)    None       No results found for: "SDES", "SPECREQUEST", "CULT", "REPTSTATUS"  Procedures:  Medications reviewed:  Scheduled Meds:  alfuzosin   10 mg Oral Daily   ferrous sulfate   325 mg Oral Q breakfast   furosemide   60 mg Intravenous Q12H   gabapentin   100 mg Oral QHS   heparin   5,000 Units Subcutaneous Q8H   lactulose   10 g Oral TID   midodrine   10 mg Oral TID WC   propranolol   10 mg Oral BID   sodium bicarbonate   650 mg Oral BID   spironolactone   25 mg Oral Daily   traZODone   50 mg Oral QHS   Continuous Infusions:  Lesa Rape, MD [facility] 09/01/2023, 10:50 AM

## 2023-09-01 NOTE — Plan of Care (Signed)

## 2023-09-01 NOTE — Progress Notes (Signed)
 Transition of Care Department [facility]) has reviewed patient and no other TOC needs have been identified at this time. We will continue to monitor patient advancement through interdisciplinary progression rounds. If new patient transition needs arise, please place a TOC consult.   09/01/23 0818  TOC Brief Assessment  Insurance and Status Reviewed  Patient has primary care physician Yes  Home environment has been reviewed Lives alone.  Prior level of function: Fairly independent.  Prior/Current Home Services No current home services  Social Drivers of Health Review SDOH reviewed no interventions necessary  Readmission risk has been reviewed Yes  Transition of care needs no transition of care needs at this time

## 2023-09-01 NOTE — Hospital Course (Addendum)
 78 yom w/ significant medical history including hepatic cirrhosis, non occlusive SMV thrombosis, htn hld and neuropathy who presented with lower extremity edema.  Patient reported rapidly worsening lower extremity edema over the last 24 hrs PTA extending into his thighs, genitals and lower abdomen. Noted easy bruising upper and lower extremities.  Seen by PCP and recommended hospitalization- he was on spironolactone  and bumetanide at home for diuresis 07/2023 outpatient GI follow up noted with stable cirrhosis with no volume overload.  In the ED: BP 119 stable afebrile not hypoxic.  Labs showed Na 135, K 4,2 Cl 105 bicarbonate 23 glucose 215 bun 42 cr 2,5  AST 68 ALT 40  Wbc 3,5 hgb 10.1 plt 47 Lower extremities doppler ultrasound with no evidence of deep venous thrombosis in either lower extremity  EKG NSR.  Subjective: Seen and examined Alert awake Denies abdominal pain.  Nausea vomiting fever chills.   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 Continue IV diuresis as tolerated by BP, holding Aldactone  today due to soft BP  Continue lactulose  10 g tid and monitor for asterixis Cont midodrine . Hold propranolol  if bp soft.  US  ABD pending for ascites-may need paracentesis if does not improve with Lasix . INR 1.6, monitor MELD 3 score 27 Recent Labs  Lab 08/31/23 1548 09/01/23 0256  AST 68*  --   ALT 40  --   ALKPHOS 77  --   BILITOT 3.6*  --   PROT 5.2*  --   ALBUMIN  2.3*   --   INR 1.6*  --   PLT 47* 39*     MELD 3.0: 27 at 09/01/2023  2:56 AM MELD-Na: 26 at 09/01/2023  2:56 AM Calculated from: Serum Creatinine: 2.34 mg/dL at 12/29/6211  0:86 AM Serum Sodium: 135 mmol/L at 09/01/2023  2:56 AM Total Bilirubin: 3.6 mg/dL at 08/30/8467  6:29 PM Serum Albumin : 2.3 g/dL at 08/25/8411  2:44 PM INR(ratio): 1.6 at 08/31/2023  3:48 PM Age at listing (hypothetical): 40 years Sex: Male at 09/01/2023  2:56 AM     Aki on CKD 3b: B/l creat ~ 2. Cont V furosemide  for volume overload. And creat slightly better today Monitor renal function and urine output closely Recent Labs    01/09/23 1241 01/12/23 1418 01/30/23 1339 02/21/23 1057 03/07/23 1345 04/28/23 1032 06/19/23 0926 07/27/23 1333 08/31/23 1548 09/01/23 0256  BUN 75* 54* 24 36* 29* 20 45* 34* 42* 40*  CREATININE 3.25* 1.89* 1.49* 1.96* 1.60* 1.54* 2.01* 1.98* 2.50* 2.34*  CO2 16* 22 21 23 22  18* 23 21 23 24   K 3.7 3.5 4.3 4.0 3.9 4.7 3.4* 4.4 4.2 4.0     Superior mesenteric vein thrombosis: Currently off anticoagulation.  Dopplers negative for DVT  Pancytopenia with anemia thrombocytopenia leukopenia Iron deficiency anemia : Patient with pancytopenia in the setting of liver cirrhosis.  Monitor CBC closely transfuse hemoglobin of less than 7.transfuse platelet if active bleeding Recent Labs  Lab 08/31/23 1548 09/01/23 0256  HGB 10.1* 8.4*  HCT 29.8* 24.6*  WBC 3.5* 3.3*  PLT 47* 39*  Ambulatory dysfunction/debility/deconditioning: Due to his worsening leg swelling having difficulty with ambulation, Continue diuresis, PT OT eval f

## 2023-09-02 DIAGNOSIS — D61818 Other pancytopenia: Secondary | ICD-10-CM

## 2023-09-02 DIAGNOSIS — N179 Acute kidney failure, unspecified: Secondary | ICD-10-CM | POA: Diagnosis not present

## 2023-09-02 DIAGNOSIS — K7210 Chronic hepatic failure without coma: Secondary | ICD-10-CM | POA: Diagnosis not present

## 2023-09-02 DIAGNOSIS — K55069 Acute infarction of intestine, part and extent unspecified: Secondary | ICD-10-CM | POA: Diagnosis not present

## 2023-09-02 LAB — CBC
HCT: 26.2 % — ABNORMAL LOW (ref 39.0–52.0)
Hemoglobin: 9.1 g/dL — ABNORMAL LOW (ref 13.0–17.0)
MCH: 37.9 pg — ABNORMAL HIGH (ref 26.0–34.0)
MCHC: 34.7 g/dL (ref 30.0–36.0)
MCV: 109.2 fL — ABNORMAL HIGH (ref 80.0–100.0)
Platelets: 41 10*3/uL — ABNORMAL LOW (ref 150–400)
RBC: 2.4 MIL/uL — ABNORMAL LOW (ref 4.22–5.81)
RDW: 15.9 % — ABNORMAL HIGH (ref 11.5–15.5)
WBC: 5 10*3/uL (ref 4.0–10.5)
nRBC: 0 % (ref 0.0–0.2)

## 2023-09-02 LAB — BASIC METABOLIC PANEL WITH GFR
Anion gap: 7 (ref 5–15)
BUN: 44 mg/dL — ABNORMAL HIGH (ref 8–23)
CO2: 24 mmol/L (ref 22–32)
Calcium: 8.1 mg/dL — ABNORMAL LOW (ref 8.9–10.3)
Chloride: 107 mmol/L (ref 98–111)
Creatinine, Ser: 2.32 mg/dL — ABNORMAL HIGH (ref 0.61–1.24)
GFR, Estimated: 28 mL/min — ABNORMAL LOW (ref >60)
Glucose, Bld: 104 mg/dL — ABNORMAL HIGH (ref 70–99)
Potassium: 4.3 mmol/L (ref 3.5–5.1)
Sodium: 138 mmol/L (ref 135–145)

## 2023-09-02 LAB — ALBUMIN: Albumin: 2.1 g/dL — ABNORMAL LOW (ref 3.5–5.0)

## 2023-09-02 MED ORDER — SPIRONOLACTONE 25 MG PO TABS
50.0000 mg | ORAL_TABLET | Freq: Every day | ORAL | Status: DC
  Administered 2023-09-03 – 2023-09-04 (×2): 50 mg via ORAL
  Filled 2023-09-02 (×4): qty 2

## 2023-09-02 MED ORDER — ALBUMIN HUMAN 25 % IV SOLN
25.0000 g | Freq: Two times a day (BID) | INTRAVENOUS | Status: DC
  Administered 2023-09-02 – 2023-09-03 (×2): 25 g via INTRAVENOUS
  Filled 2023-09-02 (×2): qty 100

## 2023-09-02 MED ORDER — FUROSEMIDE 10 MG/ML IJ SOLN
60.0000 mg | Freq: Two times a day (BID) | INTRAMUSCULAR | Status: DC
  Administered 2023-09-02 – 2023-09-03 (×2): 60 mg via INTRAVENOUS
  Filled 2023-09-02 (×2): qty 6

## 2023-09-02 NOTE — Progress Notes (Signed)
 Mobility Specialist Progress Note:    09/02/23 1108  Mobility  Activity Ambulated with assistance in room;Transferred from bed to chair  Level of Assistance Moderate assist, patient does 50-74%  Assistive Device Front wheel walker  Distance Ambulated (ft) 20 ft  Range of Motion/Exercises Active;All extremities  Activity Response Tolerated well  Mobility Referral Yes  Mobility visit 1 Mobility  Mobility Specialist Start Time (ACUTE ONLY) 1045  Mobility Specialist Stop Time (ACUTE ONLY) 1105  Mobility Specialist Time Calculation (min) (ACUTE ONLY) 20 min   Pt received in bathroom, agreeable to further mobility. Required ModA to stand and CGA to ambulate with RW. Tolerated well, asx throughout. Left pt in chair, alarm on and call bell in hand. All needs met.  Glinda Lapping Mobility Specialist Please contact via Special educational needs teacher or  Rehab office at [phone]

## 2023-09-02 NOTE — Plan of Care (Signed)

## 2023-09-02 NOTE — Progress Notes (Signed)
 PROGRESS NOTE  MAY FAYARD ZOX:[id number] DOB: Aug 22, 1945   PCP: Veda Gerald, MD  Patient is from: Home.  Lives alone.  Uses rolling walker for ambulation.  DOA: 08/31/2023 LOS: 2  Chief complaints Chief Complaint  Patient presents with   Leg Swelling     Brief Narrative / Interim history: 78 year old M with PMH of alcoholic cirrhosis with PHTN,, nonocclusive SMV thrombosis, CKD-3B, HTN, HLD and neuropathy sent to ED by PCP after he presented today with worsening lower extremity edema extending all his way to lower abdomen and easy bruising despite taking his diuretics, and admitted with decompensated liver cirrhosis with ascites and anasarca.   In ED, stable vitals. Cr 2.5 (baseline 2.0).  BUN 42.  AST 68.  Total bili 3.6.  Glucose 215.  WBC 3.5.  Hgb 10.1.  MCV 111.6.  Platelet 47.  INR 1.6.  Lower extremity venous Doppler negative for DVT.  Started on IV Lasix  and admitted.    Subjective: Seen and examined earlier this morning.  No major events overnight of this morning.  No complaints.  Sleepy but wakes to voice.  He feels edema is better.  Reports bowel movement.  He denies blood in stool.  Still with abdominal distention.  He says he never had paracentesis  Objective: Vitals:   09/01/23 1822 09/01/23 2053 09/02/23 0453 09/02/23 0819  BP: (!) 119/50 (!) 117/51 (!) 99/49 (!) 111/46  Pulse: 67 73 66 65  Resp:  18 20   Temp:  98.3 F (36.8 C) 98.2 F (36.8 C)   TempSrc:  Oral Oral   SpO2: 97% 96% 94%   Weight:      Height:        Examination:  GENERAL: No apparent distress.  Nontoxic. HEENT: MMM.  Vision and hearing grossly intact.  NECK: Supple.  No apparent JVD.  RESP:  No IWOB.  Fair aeration bilaterally. CVS:  RRR. Heart sounds normal.  ABD/GI/GU: BS+.  Abdomen distended.  Not tender. MSK/EXT:  Moves extremities.  BLE edema all over. SKIN: Right over the medial aspect of bilateral thighs NEURO: Awake, alert and oriented appropriately.  No apparent  focal neuro deficit. PSYCH: Calm. Normal affect.   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  -Paracentesis with fluid analysis.  IR unable to accommodate until Monday -Will administer Lasix  with albumin  -Increase Aldactone  to 50 mg daily -Strict intake and output  AKI on CKD-3B: b/l Cr ~2.0.  Improving with diuretics. Recent Labs    01/12/23 1418 01/30/23 1339 02/21/23 1057 03/07/23 1345 04/28/23 1032 06/19/23 0926 07/27/23 1333 08/31/23 1548 09/01/23 0256 09/02/23 0504  BUN 54* 24 36* 29* 20 45* 34* 42* 40* 44*  CREATININE 1.89* 1.49* 1.96* 1.60* 1.54* 2.01* 1.98* 2.50* 2.34* 2.32*  -Continue monitoring  Nonocclusive Superior mesenteric vein thrombosis: Not on anticoagulation due to high risk for bleeding and thrombocytopenia.  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 - Continue monitoring  Ambulatory dysfunction/debility/deconditioning: -Diuretics as above -PT/OT eval  Body mass index is 26.94 kg/m.          DVT prophylaxis:  SCDs Start: 08/31/23 1912  Code Status: Full code Family Communication: None at bedside Level of care: Med-Surg  Status is: Inpatient Remains inpatient appropriate because: Decompensated liver cirrhosis with ascites and anasarca   Final disposition: To be determined   55 minutes with more than 50% spent in reviewing records, counseling patient/family and coordinating care.   Sch Meds:  Scheduled Meds:  alfuzosin   10 mg Oral Daily   ferrous sulfate   325 mg Oral Q breakfast   furosemide   60 mg Intravenous BID   gabapentin   100 mg Oral QHS   lactulose   10 g Oral  TID   midodrine   10 mg Oral TID WC   propranolol   10 mg Oral BID   sodium bicarbonate   650 mg Oral BID   spironolactone   25 mg Oral Daily   traZODone   50 mg Oral QHS   Continuous Infusions:  albumin  human 25 g (09/02/23 0913)   PRN Meds:.acetaminophen  **OR** acetaminophen , ondansetron  **OR** ondansetron  (ZOFRAN ) IV  Antimicrobials: Anti-infectives (From admission, onward)    None        I have personally reviewed the following labs and images: CBC: Recent Labs  Lab 08/31/23 1548 09/01/23 0256 09/02/23 0504  WBC 3.5* 3.3* 5.0  NEUTROABS 2.1  --   --   HGB 10.1* 8.4* 9.1*  HCT 29.8* 24.6* 26.2*  MCV 111.6* 109.3* 109.2*  PLT 47* 39* 41*   BMP &GFR Recent Labs  Lab 08/31/23 1548 09/01/23 0256 09/02/23 0504  NA 135 135 138  K 4.2 4.0 4.3  CL 105 106 107  CO2 23 24 24   GLUCOSE 215* 97 104*  BUN 42* 40* 44*  CREATININE 2.50* 2.34* 2.32*  CALCIUM 8.4* 7.9* 8.1*   Estimated Creatinine Clearance: 28.4 mL/min (A) (by C-G formula based on SCr of 2.32 mg/dL (H)). Liver & Pancreas: Recent Labs  Lab 08/31/23 1548 09/02/23 0504  AST 68*  --   ALT 40  --   ALKPHOS 77  --   BILITOT 3.6*  --   PROT 5.2*  --   ALBUMIN  2.3* 2.1*   No results for input(s): "LIPASE", "AMYLASE" in the last 168 hours. No results for input(s): "AMMONIA" in the last 168 hours. Diabetic: No results for input(s): "HGBA1C" in the last 72 hours. No results for input(s): "GLUCAP" in the last 168 hours. Cardiac Enzymes: No results for input(s): "CKTOTAL", "CKMB", "CKMBINDEX", "TROPONINI" in the last 168 hours. No results for input(s): "PROBNP" in the last 8760 hours. Coagulation Profile: Recent Labs  Lab 08/31/23 1548  INR 1.6*   Thyroid Function Tests: No results for input(s): "TSH", "T4TOTAL", "FREET4", "T3FREE", "THYROIDAB" in the last 72 hours. Lipid Profile: No results for input(s): "CHOL", "HDL", "LDLCALC", "TRIG", "CHOLHDL", "LDLDIRECT" in the last 72 hours. Anemia Panel: No  results for input(s): "VITAMINB12", "FOLATE", "FERRITIN", "TIBC", "IRON", "RETICCTPCT" in the last 72 hours. Urine analysis: No results found for: "COLORURINE", "APPEARANCEUR", "LABSPEC", "PHURINE", "GLUCOSEU", "HGBUR", "BILIRUBINUR", "KETONESUR", "PROTEINUR", "UROBILINOGEN", "NITRITE", "LEUKOCYTESUR" Sepsis Labs: Invalid input(s): "PROCALCITONIN", "LACTICIDVEN"  Microbiology: No results found for this or any previous visit (from the past 240 hours).  Radiology Studies: No results found.    Forest Redwine T. Skyeler Scalese [facility]  If 7PM-7AM, please contact night-coverage [email or web address] 09/02/2023, 1:14 PM

## 2023-09-02 NOTE — Progress Notes (Signed)
 Mobility Specialist Progress Note:    09/02/23 1157  Mobility  Activity Transferred from chair to bed  Level of Assistance Moderate assist, patient does 50-74%  Assistive Device Front wheel walker  Distance Ambulated (ft) 2 ft  Range of Motion/Exercises Active;All extremities  Activity Response Tolerated well  Mobility Referral Yes  Mobility visit 1 Mobility  Mobility Specialist Start Time (ACUTE ONLY) 1140  Mobility Specialist Stop Time (ACUTE ONLY) 1155  Mobility Specialist Time Calculation (min) (ACUTE ONLY) 15 min   Pt received requesting assistance back to bed. Required ModA to stand and transfer with RW. Tolerated well, asx throughout. Left pt supine, alarm on. NT in room for assistance, all needs met.  Glinda Lapping Mobility Specialist Please contact via Special educational needs teacher or  Rehab office at [phone]

## 2023-09-03 DIAGNOSIS — K55069 Acute infarction of intestine, part and extent unspecified: Secondary | ICD-10-CM | POA: Diagnosis not present

## 2023-09-03 DIAGNOSIS — N179 Acute kidney failure, unspecified: Secondary | ICD-10-CM | POA: Diagnosis not present

## 2023-09-03 DIAGNOSIS — D61818 Other pancytopenia: Secondary | ICD-10-CM | POA: Diagnosis not present

## 2023-09-03 DIAGNOSIS — K7210 Chronic hepatic failure without coma: Secondary | ICD-10-CM | POA: Diagnosis not present

## 2023-09-03 LAB — COMPREHENSIVE METABOLIC PANEL WITH GFR
ALT: 30 U/L (ref 0–44)
AST: 48 U/L — ABNORMAL HIGH (ref 15–41)
Albumin: 2.2 g/dL — ABNORMAL LOW (ref 3.5–5.0)
Alkaline Phosphatase: 60 U/L (ref 38–126)
Anion gap: 9 (ref 5–15)
BUN: 45 mg/dL — ABNORMAL HIGH (ref 8–23)
CO2: 22 mmol/L (ref 22–32)
Calcium: 8.5 mg/dL — ABNORMAL LOW (ref 8.9–10.3)
Chloride: 107 mmol/L (ref 98–111)
Creatinine, Ser: 2.46 mg/dL — ABNORMAL HIGH (ref 0.61–1.24)
GFR, Estimated: 26 mL/min — ABNORMAL LOW (ref >60)
Glucose, Bld: 95 mg/dL (ref 70–99)
Potassium: 4.2 mmol/L (ref 3.5–5.1)
Sodium: 138 mmol/L (ref 135–145)
Total Bilirubin: 5.6 mg/dL — ABNORMAL HIGH (ref 0.0–1.2)
Total Protein: 4.5 g/dL — ABNORMAL LOW (ref 6.5–8.1)

## 2023-09-03 LAB — CBC
HCT: 24 % — ABNORMAL LOW (ref 39.0–52.0)
Hemoglobin: 7.9 g/dL — ABNORMAL LOW (ref 13.0–17.0)
MCH: 36.4 pg — ABNORMAL HIGH (ref 26.0–34.0)
MCHC: 32.9 g/dL (ref 30.0–36.0)
MCV: 110.6 fL — ABNORMAL HIGH (ref 80.0–100.0)
Platelets: 34 10*3/uL — ABNORMAL LOW (ref 150–400)
RBC: 2.17 MIL/uL — ABNORMAL LOW (ref 4.22–5.81)
RDW: 15.8 % — ABNORMAL HIGH (ref 11.5–15.5)
WBC: 5.1 10*3/uL (ref 4.0–10.5)
nRBC: 0 % (ref 0.0–0.2)

## 2023-09-03 LAB — MAGNESIUM: Magnesium: 1.8 mg/dL (ref 1.7–2.4)

## 2023-09-03 LAB — PHOSPHORUS: Phosphorus: 3.3 mg/dL (ref 2.5–4.6)

## 2023-09-03 LAB — PROTIME-INR
INR: 1.7 — ABNORMAL HIGH (ref 0.8–1.2)
Prothrombin Time: 20.5 s — ABNORMAL HIGH (ref 11.4–15.2)

## 2023-09-03 LAB — AMMONIA: Ammonia: 36 umol/L — ABNORMAL HIGH (ref 9–35)

## 2023-09-03 LAB — TSH: TSH: 0.998 u[IU]/mL (ref 0.350–4.500)

## 2023-09-03 MED ORDER — LACTULOSE 10 GM/15ML PO SOLN
20.0000 g | Freq: Three times a day (TID) | ORAL | Status: DC
  Administered 2023-09-03 – 2023-09-04 (×5): 20 g via ORAL
  Filled 2023-09-03 (×8): qty 30

## 2023-09-03 MED ORDER — ALBUMIN HUMAN 25 % IV SOLN
25.0000 g | Freq: Two times a day (BID) | INTRAVENOUS | Status: AC
  Administered 2023-09-03 – 2023-09-04 (×2): 25 g via INTRAVENOUS
  Filled 2023-09-03 (×2): qty 100

## 2023-09-03 MED ORDER — FUROSEMIDE 10 MG/ML IJ SOLN
60.0000 mg | Freq: Two times a day (BID) | INTRAMUSCULAR | Status: DC
  Administered 2023-09-03 – 2023-09-04 (×2): 60 mg via INTRAVENOUS
  Filled 2023-09-03 (×2): qty 6

## 2023-09-03 NOTE — Plan of Care (Signed)
 ?  Problem: Clinical Measurements: ?Goal: Ability to maintain clinical measurements within normal limits will improve ?Outcome: Progressing ?Goal: Will remain free from infection ?Outcome: Progressing ?Goal: Diagnostic test results will improve ?Outcome: Progressing ?  ?

## 2023-09-03 NOTE — Plan of Care (Signed)
   Problem: Education: Goal: Knowledge of General Education information will improve Description Including pain rating scale, medication(s)/side effects and non-pharmacologic comfort measures Outcome: Progressing   Problem: Health Behavior/Discharge Planning: Goal: Ability to manage health-related needs will improve Outcome: Progressing

## 2023-09-03 NOTE — Progress Notes (Signed)
 PROGRESS NOTE  Mike Moreno ZOX:[id number] DOB: 11-11-1945   PCP: Veda Gerald, MD  Patient is from: Home.  Lives alone.  Uses rolling walker for ambulation.  DOA: 08/31/2023 LOS: 3  Chief complaints Chief Complaint  Patient presents with   Leg Swelling     Brief Narrative / Interim history: 78 year old M with PMH of alcoholic cirrhosis with PHTN,, nonocclusive SMV thrombosis, CKD-3B, HTN, HLD and neuropathy sent to ED by PCP after he presented today with worsening lower extremity edema extending all his way to lower abdomen and easy bruising despite taking his diuretics, and admitted with decompensated liver cirrhosis with ascites and anasarca.   In ED, stable vitals. Cr 2.5 (baseline 2.0).  BUN 42.  AST 68.  Total bili 3.6.  Glucose 215.  WBC 3.5.  Hgb 10.1.  MCV 111.6.  Platelet 47.  INR 1.6.  Lower extremity venous Doppler negative for DVT.  Started on IV Lasix  and admitted.  Remains on IV Lasix .  IR consulted for paracentesis which is planned for 5/12.  Subjective: Seen and examined earlier this morning.  No major events overnight or this morning.  Sleepy but wakes to voice.  Not alert but oriented x 4.  Reports improvement in swelling and pain.  Reports 3 bowel movements last night.  Discussed about CODE STATUS.  Patient states that he is DNR and has a paper on his door at home  Objective: Vitals:   09/02/23 2304 09/03/23 0323 09/03/23 0935 09/03/23 1230  BP: (!) 103/52 (!) 105/47 (!) 109/52 (!) 104/47  Pulse: 66 64 64 62  Resp: 20 20  18   Temp: 99.1 F (37.3 C) 99.5 F (37.5 C)  98 F (36.7 C)  TempSrc: Oral Oral  Oral  SpO2: 95% 94%  96%  Weight:  98.1 kg    Height:        Examination:  GENERAL: No apparent distress.  Nontoxic. HEENT: MMM.  Vision and hearing grossly intact.  NECK: Supple.  No apparent JVD.  RESP:  No IWOB.  Fair aeration bilaterally. CVS:  RRR. Heart sounds normal.  ABD/GI/GU: BS+.  Abdomen distended.  Not tender. MSK/EXT:  Moves  extremities.  2+ BLE edema.  Asterixis. SKIN: Bruising over posteromedial aspect of his legs bilaterally NEURO: Sleepy but wakes to voice.  Not alert but oriented x 4.  Asterixis. PSYCH: Calm. Normal affect.   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  -Paracentesis with fluid analysis.  IR unable to accommodate until Monday -Continue IV Lasix  with albumin  today and will drop albumin  tomorrow -Increased Aldactone  to 50 mg daily on 5/11 -Strict intake and output  AKI on CKD-3B: b/l Cr ~2.0.  Creatinine relatively stable. Recent Labs    01/30/23 1339 02/21/23 1057 03/07/23 1345 04/28/23 1032 06/19/23 0926 07/27/23 1333 08/31/23 1548 09/01/23 0256 09/02/23 0504 09/03/23 0515  BUN 24 36* 29* 20 45* 34* 42* 40* 44* 45*  CREATININE 1.49* 1.96* 1.60* 1.54* 2.01* 1.98* 2.50* 2.34* 2.32* 2.46*  -Continue monitoring  Nonocclusive Superior mesenteric vein thrombosis: Not on anticoagulation due to high risk for bleeding and thrombocytopenia.  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  Hgb and platelet relatively stable. -Continue monitoring  Ambulatory dysfunction/debility/deconditioning: -Diuretics as above -PT/OT eval  Body mass index is 30.16 kg/m.  Pressure skin injury: Unclear if present on admission. Pressure Injury 09/02/23 Sacrum Medial Stage 2 -  Partial thickness loss of dermis presenting as a shallow open injury with a red, pink wound bed without slough. small stage 2 surronded  by stage 1 (Active)  09/02/23 2151  Location: Sacrum  Location Orientation: Medial   Staging: Stage 2 -  Partial thickness loss of dermis presenting as a shallow open injury with a red, pink wound bed without slough.  Wound Description (Comments): small stage 2 surronded  by stage 1  Present on Admission: No   DVT prophylaxis:  SCDs Start: 08/31/23 1912  Code Status: DNR-confirmed with patient. Family Communication: None at bedside Level of care: Telemetry Status is: Inpatient Remains inpatient appropriate because: Decompensated liver cirrhosis with ascites and anasarca   Final disposition: To be determined   55 minutes with more than 50% spent in reviewing records, counseling patient/family and coordinating care.   Sch Meds:  Scheduled Meds:  alfuzosin   10 mg Oral Daily   ferrous sulfate   325 mg Oral Q breakfast   furosemide   60 mg Intravenous BID   gabapentin   100 mg Oral QHS   lactulose   10 g Oral TID   midodrine   10 mg Oral TID WC   propranolol   10 mg Oral BID   sodium bicarbonate   650 mg Oral BID   spironolactone   50 mg Oral Daily   traZODone   50 mg Oral QHS   Continuous Infusions:  albumin  human 25 g (09/03/23 0618)   PRN Meds:.acetaminophen  **OR** acetaminophen , ondansetron  **OR** ondansetron  (ZOFRAN ) IV  Antimicrobials: Anti-infectives (From admission, onward)    None        I have personally reviewed the following labs and images: CBC: Recent Labs  Lab 08/31/23 1548 09/01/23 0256 09/02/23 0504 09/03/23 0515  WBC 3.5* 3.3* 5.0 5.1  NEUTROABS 2.1  --   --   --   HGB 10.1* 8.4* 9.1* 7.9*  HCT 29.8* 24.6* 26.2* 24.0*  MCV 111.6* 109.3* 109.2* 110.6*  PLT 47* 39* 41* 34*   BMP &GFR Recent Labs  Lab 08/31/23 1548 09/01/23 0256 09/02/23 0504 09/03/23 0515  NA 135 135 138 138  K 4.2 4.0 4.3 4.2  CL 105 106 107 107  CO2 23 24 24 22   GLUCOSE 215* 97 104* 95  BUN 42* 40* 44* 45*  CREATININE 2.50* 2.34* 2.32* 2.46*  CALCIUM 8.4* 7.9* 8.1* 8.5*  MG  --   --   --  1.8  PHOS  --   --   --  3.3   Estimated Creatinine  Clearance: 30 mL/min (A) (by C-G formula based on SCr of 2.46 mg/dL (H)). Liver & Pancreas: Recent Labs  Lab 08/31/23 1548 09/02/23 0504 09/03/23 0515  AST 68*  --  48*  ALT 40  --  30  ALKPHOS 77  --  60  BILITOT 3.6*  --  5.6*  PROT 5.2*  --  4.5*  ALBUMIN  2.3* 2.1* 2.2*   No results for input(s): "LIPASE", "AMYLASE" in the last 168 hours. Recent Labs  Lab 09/03/23 0515  AMMONIA 36*   Diabetic: No results for input(s): "HGBA1C" in the last 72 hours. No results for input(s): "GLUCAP" in the last 168 hours. Cardiac Enzymes: No results for input(s): "CKTOTAL", "CKMB", "CKMBINDEX", "TROPONINI" in the last 168 hours. No results for input(s): "  PROBNP" in the last 8760 hours. Coagulation Profile: Recent Labs  Lab 08/31/23 1548 09/03/23 0515  INR 1.6* 1.7*   Thyroid Function Tests: Recent Labs    09/03/23 0515  TSH 0.998   Lipid Profile: No results for input(s): "CHOL", "HDL", "LDLCALC", "TRIG", "CHOLHDL", "LDLDIRECT" in the last 72 hours. Anemia Panel: No results for input(s): "VITAMINB12", "FOLATE", "FERRITIN", "TIBC", "IRON", "RETICCTPCT" in the last 72 hours. Urine analysis: No results found for: "COLORURINE", "APPEARANCEUR", "LABSPEC", "PHURINE", "GLUCOSEU", "HGBUR", "BILIRUBINUR", "KETONESUR", "PROTEINUR", "UROBILINOGEN", "NITRITE", "LEUKOCYTESUR" Sepsis Labs: Invalid input(s): "PROCALCITONIN", "LACTICIDVEN"  Microbiology: No results found for this or any previous visit (from the past 240 hours).  Radiology Studies: No results found.    Severus Brodzinski T. Arlesia Kiel [facility]  If 7PM-7AM, please contact night-coverage [email or web address] 09/03/2023, 12:32 PM

## 2023-09-04 ENCOUNTER — Encounter (HOSPITAL_COMMUNITY): Payer: Self-pay | Admitting: Internal Medicine

## 2023-09-04 ENCOUNTER — Inpatient Hospital Stay (HOSPITAL_COMMUNITY)

## 2023-09-04 DIAGNOSIS — D696 Thrombocytopenia, unspecified: Secondary | ICD-10-CM

## 2023-09-04 DIAGNOSIS — K7290 Hepatic failure, unspecified without coma: Secondary | ICD-10-CM

## 2023-09-04 DIAGNOSIS — K7031 Alcoholic cirrhosis of liver with ascites: Secondary | ICD-10-CM

## 2023-09-04 DIAGNOSIS — N179 Acute kidney failure, unspecified: Secondary | ICD-10-CM | POA: Diagnosis not present

## 2023-09-04 DIAGNOSIS — K7210 Chronic hepatic failure without coma: Secondary | ICD-10-CM | POA: Diagnosis not present

## 2023-09-04 DIAGNOSIS — K55069 Acute infarction of intestine, part and extent unspecified: Secondary | ICD-10-CM | POA: Diagnosis not present

## 2023-09-04 DIAGNOSIS — E8770 Fluid overload, unspecified: Secondary | ICD-10-CM

## 2023-09-04 LAB — GRAM STAIN: Gram Stain: NONE SEEN

## 2023-09-04 LAB — CBC
HCT: 23.4 % — ABNORMAL LOW (ref 39.0–52.0)
Hemoglobin: 7.7 g/dL — ABNORMAL LOW (ref 13.0–17.0)
MCH: 36.8 pg — ABNORMAL HIGH (ref 26.0–34.0)
MCHC: 32.9 g/dL (ref 30.0–36.0)
MCV: 112 fL — ABNORMAL HIGH (ref 80.0–100.0)
Platelets: 37 10*3/uL — ABNORMAL LOW (ref 150–400)
RBC: 2.09 MIL/uL — ABNORMAL LOW (ref 4.22–5.81)
RDW: 15.9 % — ABNORMAL HIGH (ref 11.5–15.5)
WBC: 4.4 10*3/uL (ref 4.0–10.5)
nRBC: 0 % (ref 0.0–0.2)

## 2023-09-04 LAB — MAGNESIUM: Magnesium: 2 mg/dL (ref 1.7–2.4)

## 2023-09-04 LAB — BODY FLUID CELL COUNT WITH DIFFERENTIAL
Eos, Fluid: 0 %
Lymphs, Fluid: 67 %
Monocyte-Macrophage-Serous Fluid: 9 % — ABNORMAL LOW (ref 50–90)
Neutrophil Count, Fluid: 24 % (ref 0–25)
Total Nucleated Cell Count, Fluid: 126 uL (ref 0–1000)

## 2023-09-04 LAB — COMPREHENSIVE METABOLIC PANEL WITH GFR
ALT: 26 U/L (ref 0–44)
AST: 37 U/L (ref 15–41)
Albumin: 2.5 g/dL — ABNORMAL LOW (ref 3.5–5.0)
Alkaline Phosphatase: 53 U/L (ref 38–126)
Anion gap: 9 (ref 5–15)
BUN: 49 mg/dL — ABNORMAL HIGH (ref 8–23)
CO2: 25 mmol/L (ref 22–32)
Calcium: 8.6 mg/dL — ABNORMAL LOW (ref 8.9–10.3)
Chloride: 104 mmol/L (ref 98–111)
Creatinine, Ser: 2.6 mg/dL — ABNORMAL HIGH (ref 0.61–1.24)
GFR, Estimated: 25 mL/min — ABNORMAL LOW (ref >60)
Glucose, Bld: 107 mg/dL — ABNORMAL HIGH (ref 70–99)
Potassium: 4.1 mmol/L (ref 3.5–5.1)
Sodium: 138 mmol/L (ref 135–145)
Total Bilirubin: 4.9 mg/dL — ABNORMAL HIGH (ref 0.0–1.2)
Total Protein: 4.8 g/dL — ABNORMAL LOW (ref 6.5–8.1)

## 2023-09-04 LAB — ALBUMIN, PLEURAL OR PERITONEAL FLUID: Albumin, Fluid: <1.5 g/dL

## 2023-09-04 LAB — PROTIME-INR
INR: 1.7 — ABNORMAL HIGH (ref 0.8–1.2)
Prothrombin Time: 20.5 s — ABNORMAL HIGH (ref 11.4–15.2)

## 2023-09-04 LAB — AMMONIA: Ammonia: 72 umol/L — ABNORMAL HIGH (ref 9–35)

## 2023-09-04 MED ORDER — SIMVASTATIN 10 MG TABLET
10 | ORAL_TABLET | ORAL | 5 refills | 90.00000 days | Status: AC
Start: 2023-09-04 — End: ?

## 2023-09-04 MED ORDER — ORAL CARE MOUTH RINSE: 15.0000 mL | OROMUCOSAL | Status: DC | PRN

## 2023-09-04 MED ORDER — FUROSEMIDE 10 MG/ML IJ SOLN
60.0000 mg | Freq: Every day | INTRAMUSCULAR | Status: DC
  Filled 2023-09-04: qty 6

## 2023-09-04 NOTE — Progress Notes (Signed)
 PROGRESS NOTE  Mike Moreno NUU:[id number] DOB: 1945/12/17   PCP: Veda Gerald, MD  Patient is from: Home.  Lives alone.  Uses rolling walker for ambulation.  DOA: 08/31/2023 LOS: 4  Chief complaints Chief Complaint  Patient presents with   Leg Swelling     Brief Narrative / Interim history: 78 year old M with PMH of alcoholic cirrhosis with PHTN,, nonocclusive SMV thrombosis, CKD-3B, HTN, HLD and neuropathy sent to ED by PCP after he presented today with worsening lower extremity edema extending all his way to lower abdomen and easy bruising despite taking his diuretics, and admitted with decompensated liver cirrhosis with ascites and anasarca.   In ED, stable vitals. Cr 2.5 (baseline 2.0).  BUN 42.  AST 68.  Total bili 3.6.  Glucose 215.  WBC 3.5.  Hgb 10.1.  MCV 111.6.  Platelet 47.  INR 1.6.  Lower extremity venous Doppler negative for DVT.  Started on IV Lasix  and admitted.  Remains on IV Lasix .  IR consulted for paracentesis which is planned for 5/12.  5/12: S/p paracentesis with removal of 250 mL of fluid.  Preliminary labs with transudative fluid.  Slight worsening of creatinine so decreasing IV Lasix  to daily instead of twice daily.  PT was recommending SNF.  Subjective: Patient was sitting comfortably in chair when seen today.  Continued to feel weak and appetite remain poor.  Objective: Vitals:   09/04/23 0634 09/04/23 0845 09/04/23 0857 09/04/23 0901  BP: (!) 93/54 (!) 100/46 (!) 115/53 97/72  Pulse: 60 63 62 60  Resp: 20 18 18 18   Temp: 98.5 F (36.9 C) 98.1 F (36.7 C)    TempSrc: Oral Oral    SpO2: 92% 99% 97% 97%  Weight: 98.4 kg     Height:        Examination:  General.  Frail elderly man, in no acute distress. Pulmonary.  Lungs clear bilaterally, normal respiratory effort. CV.  Regular rate and rhythm, no JVD, rub or murmur. Abdomen.  Soft, nontender, mildly distended, BS positive. CNS.  Alert and oriented .  No focal neurologic  deficit. Extremities.  2+ LE edema,  pulses intact and symmetrical. Psychiatry.  Judgment and insight appears normal.   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  Preliminary labs on paracentesis fluid appears transudative-pending culture -Continue IV Lasix -dose decreased to daily due to increasing creatinine -Increased Aldactone  to 50 mg daily on 5/11 -Strict intake and output  AKI on CKD-3B: b/l Cr ~2.0.  Creatinine relatively stable. Recent Labs    02/21/23 1057 03/07/23 1345 04/28/23 1032 06/19/23 0926 07/27/23 1333 08/31/23 1548 09/01/23 0256 09/02/23 0504 09/03/23 0515 09/04/23 0449  BUN 36* 29* 20 45* 34* 42* 40* 44* 45* 49*  CREATININE 1.96* 1.60* 1.54* 2.01* 1.98* 2.50* 2.34* 2.32* 2.46* 2.60*  -Continue monitoring  Nonocclusive Superior mesenteric vein thrombosis: Not on anticoagulation due to high risk for bleeding and thrombocytopenia.  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  Hgb and platelet relatively stable. -Continue monitoring  Ambulatory dysfunction/debility/deconditioning: -Diuretics as above -PT/OT eval-recommending SNF - TOC consult for placement  Body mass index is 30.26 kg/m.  Pressure skin injury: Unclear if  present on admission. Pressure Injury 09/02/23 Sacrum Medial Stage 2 -  Partial thickness loss of dermis presenting as a shallow open injury with a red, pink wound bed without slough. small stage 2 surronded  by stage 1 (Active)  09/02/23 2151  Location: Sacrum  Location Orientation: Medial  Staging: Stage 2 -  Partial  thickness loss of dermis presenting as a shallow open injury with a red, pink wound bed without slough.  Wound Description (Comments): small stage 2 surronded  by stage 1  Present on Admission: No   DVT prophylaxis:  SCDs Start: 08/31/23 1912  Code Status: DNR-confirmed with patient. Family Communication: None at bedside Level of care: Telemetry Status is: Inpatient Remains inpatient appropriate because: Decompensated liver cirrhosis with ascites and anasarca   Final disposition: SNF  50 minutes with more than 50% spent in reviewing records, counseling patient/family and coordinating care.   Sch Meds:  Scheduled Meds:  alfuzosin   10 mg Oral Daily   ferrous sulfate   325 mg Oral Q breakfast   [START ON 09/05/2023] furosemide   60 mg Intravenous Daily   gabapentin   100 mg Oral QHS   lactulose   20 g Oral TID   midodrine   10 mg Oral TID WC   propranolol   10 mg Oral BID   sodium bicarbonate   650 mg Oral BID   spironolactone   50 mg Oral Daily   traZODone   50 mg Oral QHS   Continuous Infusions:   PRN Meds:.acetaminophen  **OR** acetaminophen , ondansetron  **OR** ondansetron  (ZOFRAN ) IV, mouth rinse  Antimicrobials: Anti-infectives (From admission, onward)    None        I have personally reviewed the following labs and images: CBC: Recent Labs  Lab 08/31/23 1548 09/01/23 0256 09/02/23 0504 09/03/23 0515 09/04/23 0449  WBC 3.5* 3.3* 5.0 5.1 4.4  NEUTROABS 2.1  --   --   --   --   HGB 10.1* 8.4* 9.1* 7.9* 7.7*  HCT 29.8* 24.6* 26.2* 24.0* 23.4*  MCV 111.6* 109.3* 109.2* 110.6* 112.0*  PLT 47* 39* 41* 34* 37*   BMP &GFR Recent Labs  Lab 08/31/23 1548 09/01/23 0256 09/02/23 0504 09/03/23 0515 09/04/23 0449  NA 135 135 138 138 138  K 4.2 4.0 4.3 4.2 4.1  CL 105 106 107 107 104  CO2 23 24 24 22 25   GLUCOSE 215* 97 104* 95 107*  BUN 42* 40* 44* 45* 49*  CREATININE 2.50* 2.34* 2.32* 2.46* 2.60*  CALCIUM 8.4* 7.9* 8.1* 8.5* 8.6*  MG  --   --   --  1.8 2.0   PHOS  --   --   --  3.3  --    Estimated Creatinine Clearance: 28.4 mL/min (A) (by C-G formula based on SCr of 2.6 mg/dL (H)). Liver & Pancreas: Recent Labs  Lab 08/31/23 1548 09/02/23 0504 09/03/23 0515 09/04/23 0449  AST 68*  --  48* 37  ALT 40  --  30 26  ALKPHOS 77  --  60 53  BILITOT 3.6*  --  5.6* 4.9*  PROT 5.2*  --  4.5* 4.8*  ALBUMIN  2.3* 2.1* 2.2* 2.5*   No results for input(s): "LIPASE", "AMYLASE" in the last 168 hours. Recent Labs  Lab 09/03/23 0515 09/04/23 0449  AMMONIA 36* 72*   Diabetic: No results for input(s): "HGBA1C" in the last 72 hours. No results for input(s): "GLUCAP" in the last 168 hours. Cardiac Enzymes: No results for input(s): "CKTOTAL", "CKMB", "CKMBINDEX", "TROPONINI" in the last 168 hours. No results  for input(s): "PROBNP" in the last 8760 hours. Coagulation Profile: Recent Labs  Lab 08/31/23 1548 09/03/23 0515 09/04/23 0449  INR 1.6* 1.7* 1.7*   Thyroid Function Tests: Recent Labs    09/03/23 0515  TSH 0.998   Lipid Profile: No results for input(s): "CHOL", "HDL", "LDLCALC", "TRIG", "CHOLHDL", "LDLDIRECT" in the last 72 hours. Anemia Panel: No results for input(s): "VITAMINB12", "FOLATE", "FERRITIN", "TIBC", "IRON", "RETICCTPCT" in the last 72 hours. Urine analysis: No results found for: "COLORURINE", "APPEARANCEUR", "LABSPEC", "PHURINE", "GLUCOSEU", "HGBUR", "BILIRUBINUR", "KETONESUR", "PROTEINUR", "UROBILINOGEN", "NITRITE", "LEUKOCYTESUR" Sepsis Labs: Invalid input(s): "PROCALCITONIN", "LACTICIDVEN"  Microbiology: Recent Results (from the past 240 hours)  Aerobic/Anaerobic Culture w Gram Stain (surgical/deep wound)     Status: None (Preliminary result)   Collection Time: 09/04/23  8:50 AM   Specimen: Ascitic; Body Fluid  Result Value Ref Range Status   Specimen Description   Final    ASCITIC Performed at [facility], [address] [place], [place] [address]    Special Requests   Final    NONE Performed at [facility], [address] [place], [place] [address]    Gram Stain   Final    NO WBC SEEN NO ORGANISMS SEEN Performed at [facility], [address], [place], [place] [address]    Culture PENDING  Incomplete   Report Status PENDING  Incomplete  Gram stain     Status: None   Collection Time: 09/04/23  8:50 AM   Specimen: Ascitic  Result Value Ref Range Status   Specimen Description ASCITIC  Final   Special Requests NONE  Final   Gram Stain   Final    NO ORGANISMS SEEN WBC PRESENT,BOTH PMN AND MONONUCLEAR CYTOSPIN SMEAR Performed at [facility], [address], [place], [place] [address]    Report Status 09/04/2023 FINAL  Final    Radiology Studies: US  Paracentesis Result Date: 09/04/2023 INDICATION: [id number] Alcoholic cirrhosis of liver with ascites (HCC) 308657 Patient admitted with decompensated cirrhosis, anasarca, new onset ascites. Request for diagnostic therapeutic paracentesis. EXAM: ULTRASOUND GUIDED DIAGNOSTIC AND THERAPEUTIC PARACENTESIS MEDICATIONS: 6 mL 1% lidocaine  COMPLICATIONS: None immediate. PROCEDURE: Informed written consent was obtained from the patient after a discussion of the risks, benefits and alternatives to treatment. A timeout was performed prior to the initiation of the procedure. Initial ultrasound scanning demonstrates a small amount of ascites within the right lower abdominal quadrant. The right lower abdomen was prepped and draped in the usual sterile fashion. 1% lidocaine  was used for local anesthesia. Following this, a 19 gauge, 7-cm, Yueh catheter was introduced. An ultrasound image was saved for documentation purposes. The paracentesis was performed. The catheter was removed and a dressing was applied. The patient tolerated the procedure well without immediate post procedural complication. FINDINGS: A total of approximately 250 mL of clear yellow fluid was removed. Samples were sent to the laboratory as requested by the clinical team. IMPRESSION:  Successful ultrasound-guided paracentesis yielding 250 mL of peritoneal fluid. Performed by Nathan Bake, PA-C PLAN: If the patient eventually requires >/=2 paracenteses in a 30 day period, candidacy for formal evaluation by the [facility] Interventional Radiology Portal Hypertension Clinic will be assessed. Art Largo, MD [facility] Radiology Electronically Signed   By: Art Largo M.D.   On: 09/04/2023 12:41    This record has been created using Conservation officer, historic buildings. Errors have been sought and corrected,but may not always be located. Such creation errors do not reflect on the standard of  care.   Luna Salinas, MD [facility]  If 7PM-7AM, please contact night-coverage [email or web address] 09/04/2023, 1:44 PM

## 2023-09-04 NOTE — TOC Initial Note (Addendum)
 Transition of Care [facility]) - Initial/Assessment Note    Patient Details  Name: Mike Moreno MRN: [id number] Date of Birth: [birth year]  Transition of [facility]) CM/SW Contact:    Orelia Binet, RN Phone Number: 09/04/2023, 1:35 PM  Clinical Narrative:     Patient admitted with Decompensation of cirrhosis of the liver. Patient lives alone, was driving himself  before this swelling and admission. PT is recommending SNF. Patient is agreeable. FL2 sent out for bed offers.          Addendum : medically stable per MD, starting Auth, bed offers pending.        Expected Discharge Plan: Skilled Nursing Facility Barriers to Discharge: Insurance Authorization, SNF Pending bed offer   Patient Goals and CMS Choice Patient states their goals for this hospitalization and ongoing recovery are:: Agreeable to SNF CMS Medicare.gov Compare Post Acute Care list provided to:: Patient Choice offered to / list presented to : Patient     Expected Discharge Plan and Services      Living arrangements for the past 2 months: Single Family Home                    Prior Living Arrangements/Services Living arrangements for the past 2 months: Single Family Home Lives with:: Self Patient language and need for interpreter reviewed:: Yes        Need for Family Participation in Patient Care: Yes (Comment) Care giver support system in place?: Yes (comment)   Criminal Activity/Legal Involvement Pertinent to Current Situation/Hospitalization: No - Comment as needed  Activities of Daily Living   ADL Screening (condition at time of admission) Independently performs ADLs?: No Does the patient have a NEW difficulty with bathing/dressing/toileting/self-feeding that is expected to last >3 days?: Yes (Initiates electronic notice to provider for possible OT consult) Does the patient have a NEW difficulty with getting in/out of bed, walking, or climbing stairs that is expected to last >3 days?: No Does the  patient have a NEW difficulty with communication that is expected to last >3 days?: No Is the patient deaf or have difficulty hearing?: No Does the patient have difficulty seeing, even when wearing glasses/contacts?: No Does the patient have difficulty concentrating, remembering, or making decisions?: No  Permission Sought/Granted      Emotional Assessment     Affect (typically observed): Accepting Orientation: : Oriented to Self, Oriented to Place, Oriented to  Time, Oriented to Situation Alcohol / Substance Use: Not Applicable Psych Involvement: No (comment)  Admission diagnosis:  Hepatic failure (HCC) [K72.90] Leg edema [R60.0] Thrombocytopenia (HCC) [D69.6] Acute kidney injury (HCC) [N17.9] Cirrhosis of liver with ascites, unspecified hepatic cirrhosis type (HCC) [K74.60, R18.8] Patient Active Problem List   Diagnosis Date Noted   Elevated serum creatinine 07/31/2023   Superior mesenteric vein thrombosis (HCC) 02/22/2023   Pancytopenia (HCC) 02/22/2023   Thrombocytopenia (HCC) 02/22/2023   CKD (chronic kidney disease) 02/16/2023   Volume overload 02/16/2023   Rectal bleeding 01/09/2023   Nausea without vomiting 01/09/2023   Diarrhea 01/09/2023   Poor appetite 01/09/2023   Abnormal CT scan 01/09/2023   Alcoholic cirrhosis of liver with ascites (HCC) 04/30/2021   Liver mass 04/22/2021   Decompensation of cirrhosis of liver (HCC) 04/16/2021   Acute kidney failure, unspecified (HCC) 04/16/2021   Hx of adenomatous polyp of colon 11/10/2016   PCP:  Veda Gerald, MD Pharmacy:   [facility] [address], [place] - [address] [place]  [address] Phone: [phone] Fax: [phone]     Social Drivers of Health (SDOH) Social History: SDOH Screenings   Food Insecurity: Food Insecurity Present (08/31/2023)  Housing: Low Risk  (08/31/2023)  Transportation Needs: No Transportation Needs (08/31/2023)  Utilities: Not At Risk (08/31/2023)  Depression (PHQ2-9):  Low Risk  (02/22/2023)  Financial Resource Strain: Low Risk  (04/17/2021)   Received from [facility]  Social Connections: Socially Isolated (08/31/2023)  Stress: No Stress Concern Present (04/17/2021)   Received from [facility]  Tobacco Use: Medium Risk (09/04/2023)   SDOH Interventions: Food Insecurity Interventions: Inpatient TOC, Other (Comment) (Pt reports he has no issues affording food.)   Readmission Risk Interventions     No data to display

## 2023-09-04 NOTE — Progress Notes (Signed)
 Patient tolerated right sided Paracentesis procedure well today and 250 ML of yellow ascites removed and sent to lab for processing. Patient verbalized understanding of post procedure instructions and transported via stretcher back to inpatient bed assignment at this time with no acute distress noted.

## 2023-09-04 NOTE — NC FL2 (Signed)
 [place]  MEDICAID FL2 LEVEL OF CARE FORM     IDENTIFICATION  Patient Name: Mike Moreno Birthdate: [birth year] Sex: male Admission Date (Current Location): 08/31/2023  [facility] and [place] Number:  Mike Moreno:  Mike Moreno,  [address] Mike Moreno [address]      Provider Number: [id number]  Attending Physician Name and Moreno:  Mike Salinas, MD  Relative Name and Phone Number:  Mike Moreno (Aunt)  [phone]    Current Level of Care: Moreno Recommended Level of Care: Skilled Nursing Facility Prior Approval Number:    Date Approved/Denied:   PASRR Number: [id number]  Discharge Plan: SNF    Current Diagnoses: Patient Active Problem List   Diagnosis Date Noted   Elevated serum creatinine 07/31/2023   Superior mesenteric vein thrombosis (HCC) 02/22/2023   Pancytopenia (HCC) 02/22/2023   Thrombocytopenia (HCC) 02/22/2023   CKD (chronic kidney disease) 02/16/2023   Volume overload 02/16/2023   Rectal bleeding 01/09/2023   Nausea without vomiting 01/09/2023   Diarrhea 01/09/2023   Poor appetite 01/09/2023   Abnormal CT scan 01/09/2023   Alcoholic cirrhosis of liver with ascites (HCC) 04/30/2021   Liver mass 04/22/2021   Decompensation of cirrhosis of liver (HCC) 04/16/2021   Acute kidney failure, unspecified (HCC) 04/16/2021   Hx of adenomatous polyp of colon 11/10/2016    Orientation RESPIRATION BLADDER Height & Weight     Self, Time, Situation, Place  Normal Continent Weight: 98.4 kg Height:  5\' 11"  (180.3 cm)  BEHAVIORAL SYMPTOMS/MOOD NEUROLOGICAL BOWEL NUTRITION STATUS      Continent Diet (See DC summary)  AMBULATORY STATUS COMMUNICATION OF NEEDS Skin     Verbally Bruising, Skin abrasions                       Personal Care Assistance Level of Assistance              Functional Limitations Info  Sight, Hearing, Speech Sight Info: Adequate Hearing Info: Adequate Speech Info: Adequate     SPECIAL CARE FACTORS FREQUENCY  PT (By licensed PT)     PT Frequency: 5 times a week              Contractures Contractures Info: Not present    Additional Factors Info  Code Status, Allergies Code Status Info: DNR- Limited Allergies Info: NKDA           Current Medications (09/04/2023):  This is the current Moreno active medication list Current Facility-Administered Medications  Medication Dose Route Frequency Provider Last Rate Last Admin   acetaminophen  (TYLENOL ) tablet 650 mg  650 mg Oral Q6H PRN Arrien, Mauricio Daniel, MD   650 mg at 09/02/23 0253   Or   acetaminophen  (TYLENOL ) suppository 650 mg  650 mg Rectal Q6H PRN Arrien, Mauricio Daniel, MD       alfuzosin  (UROXATRAL ) 24 hr tablet 10 mg  10 mg Oral Daily Arrien, Curlee Doss, MD   10 mg at 09/04/23 5643   ferrous sulfate  tablet 325 mg  325 mg Oral Q breakfast Arrien, Curlee Doss, MD   325 mg at 09/04/23 3295   furosemide  (LASIX ) injection 60 mg  60 mg Intravenous BID Gonfa, Taye T, MD   60 mg at 09/04/23 1884   gabapentin  (NEURONTIN ) capsule 100 mg  100 mg Oral QHS Arrien, Mauricio Daniel, MD   100 mg at 09/03/23 2200   lactulose  (CHRONULAC ) 10 GM/15ML solution 20 g  20 g Oral TID Gonfa, Taye  T, MD   20 g at 09/04/23 0919   midodrine  (PROAMATINE ) tablet 10 mg  10 mg Oral TID WC Kc, Ramesh, MD   10 mg at 09/04/23 1312   ondansetron  (ZOFRAN ) tablet 4 mg  4 mg Oral Q6H PRN Arrien, Mauricio Daniel, MD       Or   ondansetron  (ZOFRAN ) injection 4 mg  4 mg Intravenous Q6H PRN Arrien, Mauricio Daniel, MD       Oral care mouth rinse  15 mL Mouth Rinse PRN Gonfa, Taye T, MD       propranolol  (INDERAL ) tablet 10 mg  10 mg Oral BID Arrien, Curlee Doss, MD   10 mg at 09/04/23 0919   sodium bicarbonate  tablet 650 mg  650 mg Oral BID Arrien, Mauricio Daniel, MD   650 mg at 09/04/23 0919   spironolactone  (ALDACTONE ) tablet 50 mg  50 mg Oral Daily Gonfa, Taye T, MD   50 mg at 09/04/23 1191   traZODone  (DESYREL )  tablet 50 mg  50 mg Oral QHS Arrien, Mauricio Daniel, MD   50 mg at 09/03/23 2200     Discharge Medications: Please see discharge summary for a list of discharge medications.  Relevant Imaging Results:  Relevant Lab Results:   Additional Information SS# [id number]  Mike Binet, RN

## 2023-09-04 NOTE — Procedures (Signed)
 PROCEDURE SUMMARY:  Successful ultrasound guided paracentesis from the right lower quadrant.  Yielded 250 mL of clear yellow fluid.  No immediate complications.  The patient tolerated the procedure well.   Specimen was sent for labs.  EBL < 5mL  If the patient eventually requires >/=2 paracenteses in a 30 day period, screening evaluation by the [facility] Interventional Radiology Portal Hypertension Clinic will be assessed.  Nathan Bake, PA-C

## 2023-09-04 NOTE — Progress Notes (Signed)
 Mobility Specialist Progress Note:    09/04/23 0920  Mobility  Activity Ambulated with assistance in room  Level of Assistance Moderate assist, patient does 50-74%  Assistive Device Front wheel walker  Distance Ambulated (ft) 8 ft  Range of Motion/Exercises Active;All extremities  Activity Response Tolerated well  Mobility Referral Yes  Mobility visit 1 Mobility  Mobility Specialist Start Time (ACUTE ONLY) 0900  Mobility Specialist Stop Time (ACUTE ONLY) 0920  Mobility Specialist Time Calculation (min) (ACUTE ONLY) 20 min   Staff requesting assistance to ambulate pt back to bed. Required ModA to stand and ambulate with RW. Tolerated well, asx throughout. Left pt supine, nurse at bedside. All needs met.  Glinda Lapping Mobility Specialist Please contact via Special educational needs teacher or  Rehab office at [phone]

## 2023-09-04 NOTE — Plan of Care (Signed)
  Problem: Education: Goal: Knowledge of General Education information will improve Description: Including pain rating scale, medication(s)/side effects and non-pharmacologic comfort measures Outcome: Progressing   Problem: Health Behavior/Discharge Planning: Goal: Ability to manage health-related needs will improve Outcome: Progressing   Problem: Nutrition: Goal: Adequate nutrition will be maintained Outcome: Progressing   Problem: Elimination: Goal: Will not experience complications related to bowel motility Outcome: Progressing   Problem: Pain Managment: Goal: General experience of comfort will improve and/or be controlled Outcome: Progressing   Problem: Safety: Goal: Ability to remain free from injury will improve Outcome: Progressing   Problem: Skin Integrity: Goal: Risk for impaired skin integrity will decrease Outcome: Progressing

## 2023-09-04 NOTE — Evaluation (Signed)
 Physical Therapy Evaluation Patient Details Name: Mike Moreno MRN: [id number] DOB: Jul 04, 1945 Today's Date: 09/04/2023  History of Present Illness  Mike Moreno is a 78 y.o. male with medical history significant of hepatic cirrhosis, non occlusive SMV thrombosis, hypertension, hyperlipidemia and neuropathy who presented with lower extremity edema.   Patient reported rapidly worsening lower extremity edema over the last 24 hrs, extending into his thighs, genitals and lower abdomen. No dyspnea or chest pain.   No confusion or agitation.   Noted easy bruising upper and lower extremities.     Because of worsening symptoms today he was evaluated by his primary care physician who recommended hospitalization for further management.      At home patient is on spironolactone  and bumetanide at home for diuresis. He has been compliant with his medications.      07/2023 outpatient GI follow up noted with stable cirrhosis with no volume overload.   Clinical Impression  Patient has difficulty moving legs during bed mobility due to weakness, pain, required repeated attempts before able to stand due to BLE weakness and limited to a few steps at bedside before having to sit due to fatigue and weakness. Patient tolerated sitting up on [facility] to have a BM - nursing staff notified. Patient will benefit from continued skilled physical therapy in hospital and recommended venue below to increase strength, balance, endurance for safe ADLs and gait.         If plan is discharge home, recommend the following: A lot of help with bathing/dressing/bathroom;A lot of help with walking and/or transfers;Help with stairs or ramp for entrance;Assistance with cooking/housework   Can travel by private vehicle   No    Equipment Recommendations None recommended by PT  Recommendations for Other Services       Functional Status Assessment Patient has had a recent decline in their functional status and demonstrates the ability  to make significant improvements in function in a reasonable and predictable amount of time.     Precautions / Restrictions Precautions Precautions: Fall Restrictions Weight Bearing Restrictions Per Provider Order: No      Mobility  Bed Mobility Overal bed mobility: Needs Assistance Bed Mobility: Rolling, Sidelying to Sit Rolling: Mod assist Sidelying to sit: Mod assist       General bed mobility comments: increased time, labored movement    Transfers Overall transfer level: Needs assistance Equipment used: Rolling walker (2 wheels) Transfers: Sit to/from Stand, Bed to chair/wheelchair/BSC Sit to Stand: Min assist, Mod assist   Step pivot transfers: Min assist, Mod assist       General transfer comment: diffiuclty completing sit to stands due to BLE weakness    Ambulation/Gait Ambulation/Gait assistance: Mod assist Gait Distance (Feet): 10 Feet Assistive device: Rolling walker (2 wheels) Gait Pattern/deviations: Decreased step length - left, Decreased step length - right, Decreased stride length Gait velocity: slow     General Gait Details: limited to a few unsteady labored side steps due to BLE weakness, poor standing balance  Stairs            Wheelchair Mobility     Tilt Bed    Modified Rankin (Stroke Patients Only)       Balance Overall balance assessment: Needs assistance Sitting-balance support: Feet supported, No upper extremity supported Sitting balance-Leahy Scale: Fair Sitting balance - Comments: fair/good seated at EOB   Standing balance support: Reliant on assistive device for balance, During functional activity, Bilateral upper extremity supported Standing balance-Leahy Scale: Poor Standing balance  comment: fair/poor using RW                             Pertinent Vitals/Pain Pain Assessment Pain Assessment: Faces Faces Pain Scale: Hurts a little bit Pain Location: legs Pain Descriptors / Indicators: Sore,  Discomfort Pain Intervention(s): Limited activity within patient's tolerance, Monitored during session, Repositioned    Home Living Family/patient expects to be discharged to:: Private residence Living Arrangements: Alone Available Help at Discharge: Family;Available PRN/intermittently Type of Home: House Home Access: Stairs to enter Entrance Stairs-Rails: Right Entrance Stairs-Number of Steps: 2   Home Layout: One level Home Equipment: Agricultural consultant (2 wheels)      Prior Function Prior Level of Function : Independent/Modified Independent             Mobility Comments: Household and short distanced community ambulation using RW PRN ADLs Comments: Independent     Extremity/Trunk Assessment   Upper Extremity Assessment Upper Extremity Assessment: Defer to OT evaluation    Lower Extremity Assessment Lower Extremity Assessment: Generalized weakness    Cervical / Trunk Assessment Cervical / Trunk Assessment: Normal  Communication   Communication Communication: No apparent difficulties    Cognition Arousal: Alert Behavior During Therapy: WFL for tasks assessed/performed   PT - Cognitive impairments: No apparent impairments                         Following commands: Intact       Cueing Cueing Techniques: Verbal cues, Tactile cues     General Comments      Exercises     Assessment/Plan    PT Assessment Patient needs continued PT services  PT Problem List Decreased strength;Decreased activity tolerance;Decreased balance;Decreased mobility       PT Treatment Interventions DME instruction;Gait training;Stair training;Functional mobility training;Therapeutic activities;Therapeutic exercise;Balance training;Patient/family education    PT Goals (Current goals can be found in the Care Plan section)  Acute Rehab PT Goals Patient Stated Goal: return home after rehab PT Goal Formulation: With patient Time For Goal Achievement: 09/18/23 Potential  to Achieve Goals: Good    Frequency Min 3X/week     Co-evaluation               AM-PAC PT "6 Clicks" Mobility  Outcome Measure Help needed turning from your back to your side while in a flat bed without using bedrails?: A Lot Help needed moving from lying on your back to sitting on the side of a flat bed without using bedrails?: A Lot Help needed moving to and from a bed to a chair (including a wheelchair)?: A Lot Help needed standing up from a chair using your arms (e.g., wheelchair or bedside chair)?: A Lot Help needed to walk in hospital room?: A Lot Help needed climbing 3-5 steps with a railing? : A Lot 6 Click Score: 12    End of Session   Activity Tolerance: Patient tolerated treatment well;Patient limited by fatigue Patient left: with call bell/phone within reach;Other (comment) (Patient left seated on [facility]) Nurse Communication: Mobility status PT Visit Diagnosis: Unsteadiness on feet (R26.81);Other abnormalities of gait and mobility (R26.89);Muscle weakness (generalized) (M62.81)    Time: 0949-1000 PT Time Calculation (min) (ACUTE ONLY): 11 min   Charges:   PT Evaluation $PT Eval Low Complexity: 1 Low PT Treatments $Therapeutic Activity: 8-22 mins PT General Charges $$ ACUTE PT VISIT: 1 Visit         1:46  PM, 09/04/23 Walton Guppy, MPT Physical Therapist with [facility] [phone] office [phone] mobile phone

## 2023-09-04 NOTE — Plan of Care (Signed)
  Problem: Acute Rehab PT Goals(only PT should resolve) Goal: Pt Will Go Supine/Side To Sit Outcome: Progressing Flowsheets (Taken 09/04/2023 1349) Pt will go Supine/Side to Sit:  with minimal assist  with moderate assist Goal: Patient Will Transfer Sit To/From Stand Outcome: Progressing Flowsheets (Taken 09/04/2023 1349) Patient will transfer sit to/from stand:  with minimal assist  with moderate assist Goal: Pt Will Transfer Bed To Chair/Chair To Bed Outcome: Progressing Flowsheets (Taken 09/04/2023 1349) Pt will Transfer Bed to Chair/Chair to Bed:  with min assist  with mod assist Goal: Pt Will Ambulate Outcome: Progressing Flowsheets (Taken 09/04/2023 1349) Pt will Ambulate:  25 feet  with minimal assist  with moderate assist  with rolling walker   1:49 PM, 09/04/23 Walton Guppy, MPT Physical Therapist with [facility] [phone] office [phone] mobile phone

## 2023-09-05 ENCOUNTER — Ambulatory Visit: Payer: Self-pay | Admitting: Gastroenterology

## 2023-09-05 DIAGNOSIS — K55069 Acute infarction of intestine, part and extent unspecified: Secondary | ICD-10-CM | POA: Diagnosis not present

## 2023-09-05 DIAGNOSIS — K7210 Chronic hepatic failure without coma: Secondary | ICD-10-CM | POA: Diagnosis not present

## 2023-09-05 DIAGNOSIS — K7290 Hepatic failure, unspecified without coma: Secondary | ICD-10-CM | POA: Diagnosis not present

## 2023-09-05 DIAGNOSIS — N179 Acute kidney failure, unspecified: Secondary | ICD-10-CM | POA: Diagnosis not present

## 2023-09-05 LAB — CBC
HCT: 23.8 % — ABNORMAL LOW (ref 39.0–52.0)
Hemoglobin: 7.9 g/dL — ABNORMAL LOW (ref 13.0–17.0)
MCH: 36.4 pg — ABNORMAL HIGH (ref 26.0–34.0)
MCHC: 33.2 g/dL (ref 30.0–36.0)
MCV: 109.7 fL — ABNORMAL HIGH (ref 80.0–100.0)
Platelets: 41 10*3/uL — ABNORMAL LOW (ref 150–400)
RBC: 2.17 MIL/uL — ABNORMAL LOW (ref 4.22–5.81)
RDW: 15.5 % (ref 11.5–15.5)
WBC: 5.2 10*3/uL (ref 4.0–10.5)
nRBC: 0 % (ref 0.0–0.2)

## 2023-09-05 LAB — PROTIME-INR
INR: 1.7 — ABNORMAL HIGH (ref 0.8–1.2)
Prothrombin Time: 20.1 s — ABNORMAL HIGH (ref 11.4–15.2)

## 2023-09-05 LAB — COMPREHENSIVE METABOLIC PANEL WITH GFR
ALT: 23 U/L (ref 0–44)
AST: 36 U/L (ref 15–41)
Albumin: 2.5 g/dL — ABNORMAL LOW (ref 3.5–5.0)
Alkaline Phosphatase: 52 U/L (ref 38–126)
Anion gap: 6 (ref 5–15)
BUN: 50 mg/dL — ABNORMAL HIGH (ref 8–23)
CO2: 25 mmol/L (ref 22–32)
Calcium: 8.5 mg/dL — ABNORMAL LOW (ref 8.9–10.3)
Chloride: 107 mmol/L (ref 98–111)
Creatinine, Ser: 2.68 mg/dL — ABNORMAL HIGH (ref 0.61–1.24)
GFR, Estimated: 24 mL/min — ABNORMAL LOW (ref >60)
Glucose, Bld: 86 mg/dL (ref 70–99)
Potassium: 4.8 mmol/L (ref 3.5–5.1)
Sodium: 138 mmol/L (ref 135–145)
Total Bilirubin: 4.2 mg/dL — ABNORMAL HIGH (ref 0.0–1.2)
Total Protein: 4.9 g/dL — ABNORMAL LOW (ref 6.5–8.1)

## 2023-09-05 NOTE — Care Management Important Message (Signed)
 Important Message  Patient Details  Name: Mike Moreno MRN: [id number] Date of Birth: Jan 23, 1946   Important Message Given:  Yes - Medicare IM     Ailine Hefferan L Iwalani Templeton 09/05/2023, 1:22 PM

## 2023-09-05 NOTE — Plan of Care (Signed)

## 2023-09-05 NOTE — Progress Notes (Signed)
 PROGRESS NOTE  Mike Moreno:[id number] DOB: 27-May-1945   PCP: Veda Gerald, MD  Patient is from: Home.  Lives alone.  Uses rolling walker for ambulation.  DOA: 08/31/2023 LOS: 5  Chief complaints Chief Complaint  Patient presents with   Leg Swelling     Brief Narrative / Interim history: 78 year old M with PMH of alcoholic cirrhosis with PHTN,, nonocclusive SMV thrombosis, CKD-3B, HTN, HLD and neuropathy sent to ED by PCP after he presented today with worsening lower extremity edema extending all his way to lower abdomen and easy bruising despite taking his diuretics, and admitted with decompensated liver cirrhosis with ascites and anasarca.   In ED, stable vitals. Cr 2.5 (baseline 2.0).  BUN 42.  AST 68.  Total bili 3.6.  Glucose 215.  WBC 3.5.  Hgb 10.1.  MCV 111.6.  Platelet 47.  INR 1.6.  Lower extremity venous Doppler negative for DVT.  Started on IV Lasix  and admitted.  Remains on IV Lasix .  IR consulted for paracentesis which is planned for 5/12.  5/12: S/p paracentesis with removal of 250 mL of fluid.  Preliminary labs with transudative fluid.  Slight worsening of creatinine so decreasing IV Lasix  to daily instead of twice daily.  PT was recommending SNF.  5/13: Paracentesis preliminary cultures remain negative.  Slight worsening of creatinine so discontinuing IV Lasix , we can give him a break tomorrow and restart p.o. Lasix  from 5/15 Also got insurance authorization later today-can go to facility tomorrow morning  Subjective: Patient was seen and examined today.  Continued to feel weak but stating that overall improving.  Objective: Vitals:   09/04/23 0901 09/04/23 1401 09/04/23 2115 09/05/23 0331  BP: 97/72 (!) 105/51 (!) 117/56 (!) 104/51  Pulse: 60  63 67  Resp: 18 18 18 20   Temp:  98.8 F (37.1 C) 98.7 F (37.1 C) 98.4 F (36.9 C)  TempSrc:  Oral Oral Oral  SpO2: 97% 99% 95% 92%  Weight:    98.9 kg  Height:        Examination:  General.  Frail  elderly man, in no acute distress. Pulmonary.  Lungs clear bilaterally, normal respiratory effort. CV.  Regular rate and rhythm, no JVD, rub or murmur. Abdomen.  Soft, nontender, nondistended, BS positive. CNS.  Alert and oriented .  No focal neurologic deficit. Extremities.  1+ LE edema, no cyanosis, pulses intact and symmetrical.  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  Preliminary labs on paracentesis fluid appears transudative-preliminary cultures negative -Discontinuing IV Lasix  due to increasing creatinine - Continue Aldactone  to 50 mg daily on 5/11 -Strict intake and output - P.o. Lasix  can be started after giving him a day break, likely from 5/15 as he already received today's dose.  AKI on CKD-3B: b/l Cr ~2.0.  Slowly worsening creatinine, currently at 2.68. -Holding further IV diuresis -Continue monitoring  Nonocclusive Superior mesenteric vein thrombosis: Not on anticoagulation due to high risk for bleeding and thrombocytopenia.  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  Hgb and platelet relatively stable. -Continue monitoring  Ambulatory dysfunction/debility/deconditioning: -Diuretics as above -PT/OT eval-recommending SNF - TOC consult for placement  Body mass  index is 30.41 kg/m.  Pressure skin injury: Unclear if present on admission. Pressure Injury 09/02/23 Sacrum Medial Stage 2 -  Partial thickness loss of dermis presenting as a shallow open injury with a red, pink wound bed without slough. small stage 2 surronded  by stage 1 (Active)  09/02/23  2151  Location: Sacrum  Location Orientation: Medial  Staging: Stage 2 -  Partial thickness loss of dermis presenting as a shallow open injury with a red, pink wound bed without slough.  Wound Description (Comments): small stage 2 surronded  by stage 1  Present on Admission: No   DVT prophylaxis:  SCDs Start: 08/31/23 1912  Code Status: DNR-confirmed with patient. Family Communication:  Level of care: Telemetry Status is: Inpatient Remains inpatient appropriate because: Decompensated liver cirrhosis with ascites and anasarca   Final disposition: SNF  50 minutes with more than 50% spent in reviewing records, counseling patient/family and coordinating care.   Sch Meds:  Scheduled Meds:  alfuzosin   10 mg Oral Daily   ferrous sulfate   325 mg Oral Q breakfast   gabapentin   100 mg Oral QHS   lactulose   20 g Oral TID   midodrine   10 mg Oral TID WC   propranolol   10 mg Oral BID   sodium bicarbonate   650 mg Oral BID   spironolactone   50 mg Oral Daily   traZODone   50 mg Oral QHS   Continuous Infusions:   PRN Meds:.acetaminophen  **OR** acetaminophen , ondansetron  **OR** ondansetron  (ZOFRAN ) IV, mouth rinse  Antimicrobials: Anti-infectives (From admission, onward)    None        I have personally reviewed the following labs and images: CBC: Recent Labs  Lab 08/31/23 1548 09/01/23 0256 09/02/23 0504 09/03/23 0515 09/04/23 0449 09/05/23 0504  WBC 3.5* 3.3* 5.0 5.1 4.4 5.2  NEUTROABS 2.1  --   --   --   --   --   HGB 10.1* 8.4* 9.1* 7.9* 7.7* 7.9*  HCT 29.8* 24.6* 26.2* 24.0* 23.4* 23.8*  MCV 111.6* 109.3* 109.2* 110.6* 112.0* 109.7*  PLT 47* 39* 41* 34* 37* 41*   BMP &GFR Recent Labs  Lab 09/01/23 0256 09/02/23 0504 09/03/23 0515 09/04/23 0449 09/05/23 0504  NA 135 138 138 138 138  K 4.0 4.3 4.2 4.1 4.8  CL 106 107 107 104 107  CO2 24 24 22 25 25   GLUCOSE 97 104* 95 107* 86  BUN 40* 44* 45* 49* 50*  CREATININE 2.34* 2.32* 2.46* 2.60* 2.68*  CALCIUM  7.9* 8.1* 8.5* 8.6* 8.5*  MG  --   --  1.8 2.0  --   PHOS  --   --  3.3  --   --    Estimated Creatinine Clearance: 27.7 mL/min (A) (by C-G formula based on SCr of 2.68 mg/dL (H)). Liver & Pancreas: Recent Labs  Lab 08/31/23 1548 09/02/23 0504 09/03/23 0515 09/04/23 0449 09/05/23 0504  AST 68*  --  48* 37 36  ALT 40  --  30 26 23   ALKPHOS 77  --  60 53 52  BILITOT 3.6*  --  5.6* 4.9* 4.2*  PROT 5.2*  --  4.5* 4.8* 4.9*  ALBUMIN  2.3* 2.1* 2.2* 2.5* 2.5*   No results for input(s): "LIPASE", "AMYLASE" in the last 168 hours. Recent Labs  Lab 09/03/23 0515 09/04/23 0449  AMMONIA 36* 72*   Diabetic: No results for input(s): "HGBA1C" in the last 72 hours. No results for input(s): "GLUCAP" in the last 168 hours. Cardiac Enzymes:  No results for input(s): "CKTOTAL", "CKMB", "CKMBINDEX", "TROPONINI" in the last 168 hours. No results for input(s): "PROBNP" in the last 8760 hours. Coagulation Profile: Recent Labs  Lab 08/31/23 1548 09/03/23 0515 09/04/23 0449 09/05/23 0504  INR 1.6* 1.7* 1.7* 1.7*   Thyroid Function Tests: Recent Labs    09/03/23 0515  TSH 0.998   Lipid Profile: No results for input(s): "CHOL", "HDL", "LDLCALC", "TRIG", "CHOLHDL", "LDLDIRECT" in the last 72 hours. Anemia Panel: No results for input(s): "VITAMINB12", "FOLATE", "FERRITIN", "TIBC", "IRON", "RETICCTPCT" in the last 72 hours. Urine analysis: No results found for: "COLORURINE", "APPEARANCEUR", "LABSPEC", "PHURINE", "GLUCOSEU", "HGBUR", "BILIRUBINUR", "KETONESUR", "PROTEINUR", "UROBILINOGEN", "NITRITE", "LEUKOCYTESUR" Sepsis Labs: Invalid input(s): "PROCALCITONIN", "LACTICIDVEN"  Microbiology: Recent Results (from the past 240 hours)  Aerobic/Anaerobic Culture w Gram Stain (surgical/deep wound)     Status: None (Preliminary result)   Collection Time: 09/04/23  8:50 AM   Specimen: Ascitic; Body Fluid  Result Value Ref Range Status   Specimen Description   Final    ASCITIC Performed at  [facility], [address], [place], [place] [address]    Special Requests   Final    NONE Performed at [facility], [address], [place], [place] [address]    Gram Stain NO WBC SEEN NO ORGANISMS SEEN   Final   Culture   Final    NO GROWTH < 24 HOURS Performed at [facility], [address], [place], [place] [address]    Report Status PENDING  Incomplete  Culture, body fluid w Gram Stain-bottle     Status: None (Preliminary result)   Collection Time: 09/04/23  8:50 AM   Specimen: Ascitic  Result Value Ref Range Status   Specimen Description ASCITIC  Final   Special Requests BOTTLES DRAWN AEROBIC AND ANAEROBIC 10CC  Final   Culture   Final    NO GROWTH < 24 HOURS Performed at [facility], [address], [place], [place] [address]    Report Status PENDING  Incomplete  Gram stain     Status: None   Collection Time: 09/04/23  8:50 AM   Specimen: Ascitic  Result Value Ref Range Status   Specimen Description ASCITIC  Final   Special Requests NONE  Final   Gram Stain   Final    NO ORGANISMS SEEN WBC PRESENT,BOTH PMN AND MONONUCLEAR CYTOSPIN SMEAR Performed at [facility], [address] [place], [place] [address]    Report Status 09/04/2023 FINAL  Final    Radiology Studies: No results found.   This record has been created using Conservation officer, historic buildings. Errors have been sought and corrected,but may not always be located. Such creation errors do not reflect on the standard of care.   Luna Salinas, MD [facility]  If 7PM-7AM, please contact night-coverage [email or web address] 09/05/2023, 2:32 PM

## 2023-09-05 NOTE — Evaluation (Signed)
 Occupational Therapy Evaluation Patient Details Name: Mike Moreno MRN: [id number] DOB: [birth year] Today's Date: 09/05/2023   History of Present Illness   Mike Moreno is a 78 y.o. male with medical history significant of hepatic cirrhosis, non occlusive SMV thrombosis, hypertension, hyperlipidemia and neuropathy who presented with lower extremity edema.   Patient reported rapidly worsening lower extremity edema over the last 24 hrs, extending into his thighs, genitals and lower abdomen. No dyspnea or chest pain.   No confusion or agitation.   Noted easy bruising upper and lower extremities.     Because of worsening symptoms today he was evaluated by his primary care physician who recommended hospitalization for further management.      At home patient is on spironolactone  and bumetanide at home for diuresis. He has been compliant with his medications.      07/2023 outpatient GI follow up noted with stable cirrhosis with no volume overload.     Clinical Impressions Pt agreeable to OT evaluation. Pt demonstrated min A for bed mobility and CGA to min A for transfer to chair and back. Pt able to ambulate ~8 feet in the room with RW with very slow, labored movement. B UE generally weak with good functional use. Assist for lower body ADL's at this time given pt's difficulty and inability to manage socks. Pt left in the bed with call bell within reach and bed alarm set. Pt will benefit from continued OT in the hospital and recommended venue below to increase strength, balance, and endurance for safe ADL's.        If plan is discharge home, recommend the following:   A little help with walking and/or transfers;A lot of help with bathing/dressing/bathroom;Assistance with cooking/housework;Assist for transportation;Help with stairs or ramp for entrance     Functional Status Assessment   Patient has had a recent decline in their functional status and demonstrates the ability to make  significant improvements in function in a reasonable and predictable amount of time.     Equipment Recommendations   None recommended by OT             Precautions/Restrictions   Precautions Precautions: Fall Recall of Precautions/Restrictions: Intact Restrictions Weight Bearing Restrictions Per Provider Order: No     Mobility Bed Mobility Overal bed mobility: Needs Assistance Bed Mobility: Supine to Sit, Sit to Supine     Supine to sit: Min assist Sit to supine: Min assist   General bed mobility comments: increased time, labored movement    Transfers Overall transfer level: Needs assistance Equipment used: Rolling walker (2 wheels) Transfers: Sit to/from Stand, Bed to chair/wheelchair/BSC Sit to Stand: Contact guard assist, Min assist     Step pivot transfers: Contact guard assist, Min assist     General transfer comment: labored movement; EOB to chair and chair to EOB      Balance Overall balance assessment: Needs assistance Sitting-balance support: No upper extremity supported, Feet supported Sitting balance-Leahy Scale: Good Sitting balance - Comments: fair/good seated at EOB   Standing balance support: Reliant on assistive device for balance, During functional activity, Bilateral upper extremity supported Standing balance-Leahy Scale: Fair Standing balance comment: using RW                           ADL either performed or assessed with clinical judgement   ADL Overall ADL's : Needs assistance/impaired     Grooming: Set up;Sitting   Upper Body Bathing: Set up;Sitting  Lower Body Bathing: Maximal assistance;Total assistance;Sitting/lateral leans   Upper Body Dressing : Set up;Sitting   Lower Body Dressing: Maximal assistance;Total assistance;Sitting/lateral leans Lower Body Dressing Details (indicate cue type and reason): Pt reported inability to doff socks today. Toilet Transfer: Contact guard assist;Minimal  assistance;Rolling walker (2 wheels);Ambulation;Stand-pivot Toilet Transfer Details (indicate cue type and reason): Simualted via EOB to chair with RW and return to bed. Toileting- Clothing Manipulation and Hygiene: Maximal assistance;Sit to/from stand Toileting - Clothing Manipulation Details (indicate cue type and reason): NT assisted with peri-care while this therapist helped the pt stand.     Functional mobility during ADLs: Contact guard assist;Rolling walker (2 wheels) General ADL Comments: Able to ambualte ~8 feet forward and backward with RW in the room.     Vision Baseline Vision/History: 0 No visual deficits Ability to See in Adequate Light: 0 Adequate Patient Visual Report: No change from baseline Vision Assessment?: No apparent visual deficits     Perception Perception: Not tested       Praxis Praxis: Not tested       Pertinent Vitals/Pain Pain Assessment Faces Pain Scale: Hurts a little bit Pain Location: legs/ feet Pain Descriptors / Indicators: Grimacing Pain Intervention(s): Monitored during session, Repositioned     Extremity/Trunk Assessment Upper Extremity Assessment Upper Extremity Assessment: Generalized weakness   Lower Extremity Assessment Lower Extremity Assessment: Defer to PT evaluation   Cervical / Trunk Assessment Cervical / Trunk Assessment: Normal   Communication Communication Communication: No apparent difficulties   Cognition Arousal: Alert Behavior During Therapy: WFL for tasks assessed/performed Cognition: No apparent impairments                               Following commands: Intact       Cueing  General Comments   Cueing Techniques: Verbal cues;Tactile cues                 Home Living Family/patient expects to be discharged to:: Private residence Living Arrangements: Alone Available Help at Discharge: Family;Available PRN/intermittently Type of Home: House Home Access: Stairs to enter [employer] of Steps: 2 Entrance Stairs-Rails: Right Home Layout: One level     Bathroom Shower/Tub: Chief Strategy Officer: Standard Bathroom Accessibility: Yes   Home Equipment: Agricultural consultant (2 wheels)          Prior Functioning/Environment Prior Level of Function : Needs assist       Physical Assist : ADLs (physical)   ADLs (physical): IADLs Mobility Comments: Household and short distanced community ambulation using RW PRN ADLs Comments: Independent ADL; assist IADL    OT Problem List: Decreased strength;Decreased activity tolerance;Impaired balance (sitting and/or standing)   OT Treatment/Interventions: Self-care/ADL training;Therapeutic exercise;Therapeutic activities;Patient/family education;Balance training      OT Goals(Current goals can be found in the care plan section)   Acute Rehab OT Goals Patient Stated Goal: improve function OT Goal Formulation: With patient Time For Goal Achievement: 09/19/23 Potential to Achieve Goals: Good   OT Frequency:  Min 2X/week                                   End of Session Equipment Utilized During Treatment: Rolling walker (2 wheels)  Activity Tolerance: Patient tolerated treatment well Patient left: in bed;with call bell/phone within reach;with bed alarm set  OT Visit Diagnosis: Unsteadiness on feet (R26.81);Other abnormalities of  gait and mobility (R26.89);Muscle weakness (generalized) (M62.81)                Time: 6045-4098 OT Time Calculation (min): 22 min Charges:  OT General Charges $OT Visit: 1 Visit OT Evaluation $OT Eval Low Complexity: 1 Low  Jaki Steptoe OT, MOT  Thurnell Floss 09/05/2023, 11:21 AM

## 2023-09-05 NOTE — Plan of Care (Signed)
  Problem: Acute Rehab OT Goals (only OT should resolve) Goal: Pt. Will Perform Grooming Flowsheets (Taken 09/05/2023 1123) Pt Will Perform Grooming: with modified independence Goal: Pt. Will Perform Lower Body Bathing Flowsheets (Taken 09/05/2023 1123) Pt Will Perform Lower Body Bathing: with modified independence Goal: Pt. Will Perform Lower Body Dressing Flowsheets (Taken 09/05/2023 1123) Pt Will Perform Lower Body Dressing: with modified independence Goal: Pt. Will Transfer To Toilet Flowsheets (Taken 09/05/2023 1123) Pt Will Transfer to Toilet: with modified independence Goal: Pt. Will Perform Toileting-Clothing Manipulation Flowsheets (Taken 09/05/2023 1123) Pt Will Perform Toileting - Clothing Manipulation and hygiene: with modified independence Goal: Pt/Caregiver Will Perform Home Exercise Program Flowsheets (Taken 09/05/2023 1123) Pt/caregiver will Perform Home Exercise Program:  Increased strength  Both right and left upper extremity  Independently  Akeem Heppler OT, MOT

## 2023-09-05 NOTE — Progress Notes (Signed)
 PT Cancellation Note  Patient Details Name: Mike Moreno MRN: [id number] DOB: 04/22/1946   Cancelled Treatment:    Reason Eval/Treat Not Completed: Patient declined, no reason specified. Pt received sleeping in bed. Attempted to arouse for PT treatment w/ pt declining due to expecting lunch soon. It was 2:30 pm. Pt lunch was sitting on bedside table. Attempted to encourage pt to sit in recliner with pt politely declining due to fatigue from previous session. Set bed in chair position and assisted pt with lunch set up. Will try to PT treatment at later time.   4:06 PM, 09/05/23 Marysue Sola, PT, DPT [place] with [facility]

## 2023-09-05 NOTE — TOC Progression Note (Signed)
 Transition of Care [facility]) - Progression Note    Patient Details  Name: Mike Moreno MRN: [id number] Date of Birth: Jul 25, 1945  Transition of Care [facility] CM/SW Contact  Orelia Binet, RN Phone Number: 09/05/2023, 1:44 PM  Clinical Narrative:   INS Auth received, CM checked with [facility], they can not admit. They have no one after lunch to enter meds. MD update Patient can discharge first thing tomorrow morning.    Expected Discharge Plan: Skilled Nursing Facility Barriers to Discharge: Other (must enter comment) (To late for [facility] to admit)  Expected Discharge Plan and Services      Living arrangements for the past 2 months: Single Family Home                    Social Determinants of Health (SDOH) Interventions SDOH Screenings   Food Insecurity: Food Insecurity Present (08/31/2023)  Housing: Low Risk  (08/31/2023)  Transportation Needs: No Transportation Needs (08/31/2023)  Utilities: Not At Risk (08/31/2023)  Depression (PHQ2-9): Low Risk  (02/22/2023)  Financial Resource Strain: Low Risk  (04/17/2021)   Received from [facility], [facility]  Social Connections: Socially Isolated (08/31/2023)  Stress: No Stress Concern Present (04/17/2021)   Received from [facility], [facility]  Tobacco Use: Medium Risk (09/04/2023)    Readmission Risk Interventions     No data to display

## 2023-09-06 ENCOUNTER — Encounter
Admit: 2023-09-06 | Payer: PRIVATE HEALTH INSURANCE | Attending: Vascular and Interventional Radiology | Primary: Family Medicine

## 2023-09-06 DIAGNOSIS — R001 Bradycardia, unspecified: Secondary | ICD-10-CM | POA: Diagnosis not present

## 2023-09-06 DIAGNOSIS — L89159 Pressure ulcer of sacral region, unspecified stage: Secondary | ICD-10-CM | POA: Diagnosis not present

## 2023-09-06 DIAGNOSIS — Z66 Do not resuscitate: Secondary | ICD-10-CM | POA: Diagnosis not present

## 2023-09-06 DIAGNOSIS — I959 Hypotension, unspecified: Secondary | ICD-10-CM | POA: Diagnosis not present

## 2023-09-06 DIAGNOSIS — K766 Portal hypertension: Secondary | ICD-10-CM | POA: Diagnosis not present

## 2023-09-06 DIAGNOSIS — K7460 Unspecified cirrhosis of liver: Secondary | ICD-10-CM | POA: Diagnosis not present

## 2023-09-06 DIAGNOSIS — K7469 Other cirrhosis of liver: Secondary | ICD-10-CM | POA: Diagnosis not present

## 2023-09-06 DIAGNOSIS — E8809 Other disorders of plasma-protein metabolism, not elsewhere classified: Secondary | ICD-10-CM | POA: Diagnosis not present

## 2023-09-06 DIAGNOSIS — R188 Other ascites: Secondary | ICD-10-CM | POA: Diagnosis not present

## 2023-09-06 DIAGNOSIS — D649 Anemia, unspecified: Secondary | ICD-10-CM | POA: Diagnosis present

## 2023-09-06 DIAGNOSIS — Z79899 Other long term (current) drug therapy: Secondary | ICD-10-CM | POA: Diagnosis not present

## 2023-09-06 DIAGNOSIS — K55069 Acute infarction of intestine, part and extent unspecified: Secondary | ICD-10-CM | POA: Diagnosis not present

## 2023-09-06 DIAGNOSIS — N1832 Chronic kidney disease, stage 3b: Secondary | ICD-10-CM | POA: Diagnosis not present

## 2023-09-06 DIAGNOSIS — I10 Essential (primary) hypertension: Secondary | ICD-10-CM | POA: Diagnosis not present

## 2023-09-06 DIAGNOSIS — N189 Chronic kidney disease, unspecified: Secondary | ICD-10-CM | POA: Diagnosis not present

## 2023-09-06 DIAGNOSIS — R69 Illness, unspecified: Secondary | ICD-10-CM | POA: Diagnosis not present

## 2023-09-06 DIAGNOSIS — R4182 Altered mental status, unspecified: Secondary | ICD-10-CM | POA: Diagnosis not present

## 2023-09-06 DIAGNOSIS — I129 Hypertensive chronic kidney disease with stage 1 through stage 4 chronic kidney disease, or unspecified chronic kidney disease: Secondary | ICD-10-CM | POA: Diagnosis not present

## 2023-09-06 DIAGNOSIS — K7030 Alcoholic cirrhosis of liver without ascites: Secondary | ICD-10-CM | POA: Diagnosis not present

## 2023-09-06 DIAGNOSIS — K3189 Other diseases of stomach and duodenum: Secondary | ICD-10-CM | POA: Diagnosis not present

## 2023-09-06 DIAGNOSIS — K7290 Hepatic failure, unspecified without coma: Secondary | ICD-10-CM | POA: Diagnosis not present

## 2023-09-06 DIAGNOSIS — D696 Thrombocytopenia, unspecified: Secondary | ICD-10-CM | POA: Diagnosis not present

## 2023-09-06 DIAGNOSIS — R4189 Other symptoms and signs involving cognitive functions and awareness: Secondary | ICD-10-CM | POA: Diagnosis not present

## 2023-09-06 DIAGNOSIS — N400 Enlarged prostate without lower urinary tract symptoms: Secondary | ICD-10-CM | POA: Diagnosis not present

## 2023-09-06 DIAGNOSIS — R531 Weakness: Secondary | ICD-10-CM | POA: Diagnosis not present

## 2023-09-06 DIAGNOSIS — K7210 Chronic hepatic failure without coma: Secondary | ICD-10-CM | POA: Diagnosis not present

## 2023-09-06 DIAGNOSIS — N179 Acute kidney failure, unspecified: Secondary | ICD-10-CM

## 2023-09-06 DIAGNOSIS — Z7401 Bed confinement status: Secondary | ICD-10-CM | POA: Diagnosis not present

## 2023-09-06 DIAGNOSIS — K7031 Alcoholic cirrhosis of liver with ascites: Secondary | ICD-10-CM | POA: Diagnosis not present

## 2023-09-06 DIAGNOSIS — K7682 Hepatic encephalopathy: Secondary | ICD-10-CM | POA: Diagnosis not present

## 2023-09-06 DIAGNOSIS — E875 Hyperkalemia: Secondary | ICD-10-CM | POA: Diagnosis not present

## 2023-09-06 DIAGNOSIS — D61818 Other pancytopenia: Secondary | ICD-10-CM | POA: Diagnosis not present

## 2023-09-06 MED ORDER — PANTOPRAZOLE SODIUM 40 MG PO TBEC: 40.0000 mg | DELAYED_RELEASE_TABLET | Freq: Two times a day (BID) | ORAL | Status: DC

## 2023-09-06 MED ORDER — PANTOPRAZOLE SODIUM 40 MG PO TBEC
40.0000 mg | DELAYED_RELEASE_TABLET | Freq: Two times a day (BID) | ORAL | Status: DC
  Administered 2023-09-06: 40 mg via ORAL
  Filled 2023-09-06: qty 1

## 2023-09-06 MED ORDER — LACTULOSE 10 GM/15ML PO SOLN: 20.0000 g | Freq: Three times a day (TID) | ORAL | Status: DC

## 2023-09-06 MED ORDER — MIDODRINE HCL 10 MG PO TABS
10.0000 mg | ORAL_TABLET | Freq: Three times a day (TID) | ORAL | Status: AC
Start: 2023-09-06 — End: ?

## 2023-09-06 NOTE — Discharge Summary (Addendum)
 Physician Discharge Summary   Patient: Mike Moreno MRN: [id number] DOB: 1946-02-16  Admit date:     08/31/2023  Discharge date: 09/06/23  Discharge Physician: Myrtie Atkinson Chyrel Taha   PCP: Veda Gerald, MD   Recommendations at discharge:   Please follow up with primary care provider within 1-2 weeks  Please repeat BMP and CBC in one week     Hospital Course: 78 year old M with PMH of alcoholic cirrhosis with PHTN,, nonocclusive SMV thrombosis, CKD-3B, HTN, HLD and neuropathy sent to ED by PCP after he presented today with worsening lower extremity edema extending all his way to lower abdomen and easy bruising despite taking his diuretics, and admitted with decompensated liver cirrhosis with ascites and anasarca.    In ED, stable vitals. Cr 2.5 (baseline 2.0).  BUN 42.  AST 68.  Total bili 3.6.  Glucose 215.  WBC 3.5.  Hgb 10.1.  MCV 111.6.  Platelet 47.  INR 1.6.  Lower extremity venous Doppler negative for DVT.  Started on IV Lasix  and admitted.   Remains on IV Lasix .  IR consulted for paracentesis which is planned for 5/12.   5/12: S/p paracentesis with removal of 250 mL of fluid.  Preliminary labs with transudative fluid.  Slight worsening of creatinine so decreasing IV Lasix  to daily instead of twice daily.  PT was recommending SNF.   5/13: Paracentesis preliminary cultures remain negative.  Slight worsening of creatinine so discontinuing IV Lasix , we can give him a break tomorrow and restart p.o. 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  Preliminary labs on paracentesis fluid appears  transudative-cultures negative -Discontinuing IV Lasix  due to increasing creatinine - Continue Aldactone  to 50 mg daily on 5/11 -Strict intake and output - P.o. Lasix  can be started after giving him a day break, likely from 5/15 as he already received 5/13 dose.   AKI on CKD-3B:  -baseline creatinine 1.9-2.0.   - Slowly worsening creatinine, currently at 2.68.with diuresis -Holding further IV diuresis -Continue monitoring - restart po bumex on 5/15   Nonocclusive Superior mesenteric vein thrombosis: Not on anticoagulation due to high risk for bleeding and thrombocytopenia.  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  Hgb and platelet relatively stable. -Continue monitoring   Ambulatory dysfunction/debility/deconditioning: -Diuretics as above -PT/OT eval-recommending SNF - TOC consult for placement   Obesity Body mass index is 30.41 kg/m.  Portal Hypertensive gastropathy -start pantoprazole bid -Hgb stable x 72 hours prior to d/c -no signs of active bleed   Pressure skin injury: Unclear if present on admission. Pressure Injury 09/02/23 Sacrum Medial Stage 2 -  Partial thickness loss of dermis presenting as a shallow open injury with a red, pink wound bed without slough. small stage 2 surronded  by stage 1 (Active)  09/02/23 2151  Location: Sacrum  Location Orientation: Medial  Staging: Stage 2 -  Partial thickness loss of dermis presenting as a shallow open injury with a red, pink wound bed without slough.  Wound Description (Comments): small stage 2 surronded  by stage 1  Present on Admission: No  Consultants: none Procedures performed: none  Disposition: Home Diet recommendation:  Low sodium DISCHARGE MEDICATION: Allergies as of 09/06/2023   No Known Allergies      Medication List     TAKE these medications    alfuzosin  10 MG 24 hr tablet Commonly known as: UROXATRAL  Take 10 mg by  mouth daily.   bumetanide 1 MG tablet Commonly known as: BUMEX Take 1 mg by mouth daily.   ferrous sulfate  325 (65 FE) MG EC tablet Take 325 mg by mouth daily with breakfast.   gabapentin  100 MG capsule Commonly known as: NEURONTIN  Take 100 mg by mouth at bedtime.   lactulose  10 GM/15ML solution Commonly known as: Constulose  Take 30 mLs (20 g total) by mouth 3 (three) times daily. What changed:  how much to take when to take this reasons to take this   midodrine  10 MG tablet Commonly known as: PROAMATINE  Take 1 tablet (10 mg total) by mouth 3 (three) times daily with meals. What changed:  medication strength how much to take when to take this   neomycin 500 MG tablet Commonly known as: MYCIFRADIN Take 500 mg by mouth 3 (three) times daily.   nitroGLYCERIN 0.4 MG SL tablet Commonly known as: NITROSTAT Place 0.4 mg under the tongue as needed for chest pain.   pantoprazole 40 MG tablet Commonly known as: PROTONIX Take 1 tablet (40 mg total) by mouth 2 (two) times daily.   promethazine 6.25 MG/5ML solution Commonly known as: PHENERGAN Take 6.25 mg by mouth 4 (four) times daily.   propranolol  10 MG tablet Commonly known as: INDERAL  Take 10 mg by mouth 2 (two) times daily.   sodium bicarbonate  650 MG tablet Take 650 mg by mouth 2 (two) times daily.   spironolactone  25 MG tablet Commonly known as: ALDACTONE  Take 25 mg by mouth daily.   traZODone  50 MG tablet Commonly known as: DESYREL  Take 50 mg by mouth at bedtime.        Contact information for after-discharge care     Destination     [facility] Preferred SNF .   Service: Skilled Nursing Contact information: [address] [place]  [address] [phone]                    Discharge Exam: Mike Moreno Weights   09/04/23 9147 09/05/23 0331 09/06/23 0509  Weight: 98.4 kg 98.9 kg 98.5 kg   HEENT:  [place]/AT, No thrush, no icterus CV:  RRR, no rub, no S3, no  S4 Lung:  CTA, no wheeze, no rhonchi Abd:  soft/+BS, NT Ext:  1 + LE edema, no lymphangitis, no synovitis, no rash   Condition at discharge: stable  The results of significant diagnostics from this hospitalization (including imaging, microbiology, ancillary and laboratory) are listed below for reference.   Imaging Studies: US  Paracentesis Result Date: 09/04/2023 INDICATION: [id number] Alcoholic cirrhosis of liver with ascites [facility]) [id number] Patient admitted with decompensated cirrhosis, anasarca, new onset ascites. Request for diagnostic therapeutic paracentesis. EXAM: ULTRASOUND GUIDED DIAGNOSTIC AND THERAPEUTIC PARACENTESIS MEDICATIONS: 6 mL 1% lidocaine  COMPLICATIONS: None immediate. PROCEDURE: Informed written consent was obtained from the patient after a discussion of the risks, benefits and alternatives to treatment. A timeout was performed prior to the initiation of the procedure. Initial ultrasound scanning demonstrates a small amount of ascites within the right lower abdominal quadrant. The right lower abdomen was prepped and draped in the usual sterile fashion. 1% lidocaine  was used for local anesthesia. Following this,  a 19 gauge, 7-cm, Yueh catheter was introduced. An ultrasound image was saved for documentation purposes. The paracentesis was performed. The catheter was removed and a dressing was applied. The patient tolerated the procedure well without immediate post procedural complication. FINDINGS: A total of approximately 250 mL of clear yellow fluid was removed. Samples were sent to the laboratory as requested by the clinical team. IMPRESSION: Successful ultrasound-guided paracentesis yielding 250 mL of peritoneal fluid. Performed by Nathan Bake, PA-C PLAN: If the patient eventually requires >/=2 paracenteses in a 30 day period, candidacy for formal evaluation by the [facility] will be assessed. Art Largo, MD [facility] Radiology Electronically Signed   By: Art Largo M.D.   On: 09/04/2023 12:41   US  Abdomen Complete Addendum Date: 09/01/2023 ADDENDUM REPORT: 09/01/2023 15:51 ADDENDUM: We attempted to retrieve the patient's prior CT abdomen pelvis from [facility] from December 27, 2022. However, we could not retrieved the axial images for comparison. Electronically Signed   By: Anna Barnes M.D.   On: 09/01/2023 15:51   Result Date: 09/01/2023 CLINICAL DATA:  Cirrhosis of liver. EXAM: ABDOMEN ULTRASOUND COMPLETE COMPARISON:  Patient's prior CT scan from December 27, 2022 is not available for comparison. FINDINGS: Gallbladder: Single focus of ring down artifact in the gallbladder raising the question of adenomyomatosis. No gallstones or wall thickening visualized. No sonographic Murphy sign noted by sonographer. Common bile duct: Diameter: 4.3 mm. Liver: Nodular contour of the liver with increased echotexture. There is a 0.8 x 0.8 x 0.7 cm hypoechoic lesion in the right lobe liver. Portal vein is patent on color Doppler imaging with normal direction of blood flow towards the liver. IVC: No abnormality visualized. Pancreas: Limited evaluation due to overlying bowel gas. Spleen: Size and appearance within normal limits. Right Kidney: Length: 10.8 cm. Echogenicity within normal limits. No hydronephrosis visualized. There is a 1.8 x 1.7 x 1.8 cm cyst, no follow-up is recommended. Left Kidney: Length: 11.7 cm. Echogenicity within normal limits. No hydronephrosis visualized. There is a 6.2 x 5.1 x 5.5 cm cyst, no follow-up is recommended. Abdominal aorta: No aneurysm visualized. Other findings: Ascites noted throughout the abdomen. IMPRESSION: 1. Cirrhotic liver.  Ascites. 2. 0.8 x 0.8 x 0.7 cm hypoechoic lesion in the right lobe liver. Recommend comparison with patient's prior CT scan from December 27, 2022 from [facility]. 3. Single focus of ring down artifact in the gallbladder  raising the question of adenomyomatosis. Electronically Signed: By: Anna Barnes M.D. On: 08/10/2023 16:53   US  ASCITES (ABDOMEN LIMITED) Result Date: 09/01/2023 CLINICAL DATA:  Evaluate for ascites.  Edema. EXAM: LIMITED ABDOMEN ULTRASOUND FOR ASCITES TECHNIQUE: Limited ultrasound survey for ascites was performed in all four abdominal quadrants. COMPARISON:  Ultrasound abdomen complete 08/10/2023 FINDINGS: Mild ascites seen in all 4 quadrants of the abdomen. IMPRESSION: Mild abdominal ascites. Electronically Signed   By: Elester Grim M.D.   On: 09/01/2023 11:25   US  Venous Img Lower Bilateral (DVT) Result Date: 08/31/2023 CLINICAL DATA:  Edema EXAM: BILATERAL LOWER EXTREMITY VENOUS DOPPLER ULTRASOUND TECHNIQUE: Gray-scale sonography with graded compression, as well as color Doppler and duplex ultrasound were performed to evaluate the lower extremity deep venous systems from the level of the common femoral vein and including the common femoral, femoral, profunda femoral, popliteal and calf veins including the posterior tibial, peroneal and gastrocnemius veins when visible. The superficial great saphenous vein was also interrogated. Spectral Doppler was utilized to evaluate flow at  rest and with distal augmentation maneuvers in the common femoral, femoral and popliteal veins. COMPARISON:  None Available. FINDINGS: RIGHT LOWER EXTREMITY Common Femoral Vein: No evidence of thrombus. Normal compressibility, respiratory phasicity and response to augmentation. Saphenofemoral Junction: No evidence of thrombus. Normal compressibility and flow on color Doppler imaging. Profunda Femoral Vein: No evidence of thrombus. Normal compressibility and flow on color Doppler imaging. Femoral Vein: No evidence of thrombus. Normal compressibility, respiratory phasicity and response to augmentation. Popliteal Vein: No evidence of thrombus. Normal compressibility, respiratory phasicity and response to augmentation. Calf Veins: No  evidence of thrombus. Normal compressibility and flow on color Doppler imaging. Some limited evaluation of the right peroneal vein. Superficial Great Saphenous Vein: No evidence of thrombus. Normal compressibility. Venous Reflux:  None. Other Findings:  Scattered edema LEFT LOWER EXTREMITY Common Femoral Vein: No evidence of thrombus. Normal compressibility, respiratory phasicity and response to augmentation. Saphenofemoral Junction: No evidence of thrombus. Normal compressibility and flow on color Doppler imaging. Profunda Femoral Vein: No evidence of thrombus. Normal compressibility and flow on color Doppler imaging. Femoral Vein: No evidence of thrombus. Normal compressibility, respiratory phasicity and response to augmentation. Popliteal Vein: No evidence of thrombus. Normal compressibility, respiratory phasicity and response to augmentation. Calf Veins: No evidence of thrombus. Normal compressibility and flow on color Doppler imaging. Superficial Great Saphenous Vein: No evidence of thrombus. Normal compressibility. Venous Reflux:  None. Other Findings:  Scattered soft tissue edema IMPRESSION: No evidence of deep venous thrombosis in either lower extremity. Electronically Signed   By: Adrianna Horde M.D.   On: 08/31/2023 18:38    Microbiology: Results for orders placed or performed during the hospital encounter of 08/31/23  Aerobic/Anaerobic Culture w Gram Stain (surgical/deep wound)     Status: None (Preliminary result)   Collection Time: 09/04/23  8:50 AM   Specimen: Ascitic; Body Fluid  Result Value Ref Range Status   Specimen Description   Final    ASCITIC Performed at [facility], [address] [place], [place] [address]    Special Requests   Final    NONE Performed at [facility], [address], [place], [place] [address]    Gram Stain NO WBC SEEN NO ORGANISMS SEEN   Final   Culture   Final    NO GROWTH 2 DAYS Performed at [facility], [address], [place], [place]  [address]    Report Status PENDING  Incomplete  Culture, body fluid w Gram Stain-bottle     Status: None (Preliminary result)   Collection Time: 09/04/23  8:50 AM   Specimen: Ascitic  Result Value Ref Range Status   Specimen Description ASCITIC  Final   Special Requests BOTTLES DRAWN AEROBIC AND ANAEROBIC 10CC  Final   Culture   Final    NO GROWTH 2 DAYS Performed at [facility], [address] [place], [place] [address]    Report Status PENDING  Incomplete  Gram stain     Status: None   Collection Time: 09/04/23  8:50 AM   Specimen: Ascitic  Result Value Ref Range Status   Specimen Description ASCITIC  Final   Special Requests NONE  Final   Gram Stain   Final    NO ORGANISMS SEEN WBC PRESENT,BOTH PMN AND MONONUCLEAR CYTOSPIN SMEAR Performed at [facility], [address] [place], [place] [address]    Report Status 09/04/2023 FINAL  Final    Labs: CBC: Recent Labs  Lab 08/31/23 1548 09/01/23 0256 09/02/23 0504 09/03/23 0515 09/04/23  0449 09/05/23 0504  WBC 3.5* 3.3* 5.0 5.1 4.4 5.2  NEUTROABS 2.1  --   --   --   --   --   HGB 10.1* 8.4* 9.1* 7.9* 7.7* 7.9*  HCT 29.8* 24.6* 26.2* 24.0* 23.4* 23.8*  MCV 111.6* 109.3* 109.2* 110.6* 112.0* 109.7*  PLT 47* 39* 41* 34* 37* 41*   Basic Metabolic Panel: Recent Labs  Lab 09/01/23 0256 09/02/23 0504 09/03/23 0515 09/04/23 0449 09/05/23 0504  NA 135 138 138 138 138  K 4.0 4.3 4.2 4.1 4.8  CL 106 107 107 104 107  CO2 24 24 22 25 25   GLUCOSE 97 104* 95 107* 86  BUN 40* 44* 45* 49* 50*  CREATININE 2.34* 2.32* 2.46* 2.60* 2.68*  CALCIUM 7.9* 8.1* 8.5* 8.6* 8.5*  MG  --   --  1.8 2.0  --   PHOS  --   --  3.3  --   --    Liver Function Tests: Recent Labs  Lab 08/31/23 1548 09/02/23 0504 09/03/23 0515 09/04/23 0449 09/05/23 0504  AST 68*  --  48* 37 36  ALT 40  --  30 26 23   ALKPHOS 77  --  60 53 52  BILITOT 3.6*  --  5.6* 4.9* 4.2*  PROT 5.2*  --  4.5* 4.8* 4.9*  ALBUMIN  2.3* 2.1* 2.2* 2.5* 2.5*   CBG: No  results for input(s): "GLUCAP" in the last 168 hours.  Discharge time spent: greater than 30 minutes.  Signed: Demaris Fillers, MD [facility] 09/06/2023

## 2023-09-06 NOTE — TOC Transition Note (Signed)
 Transition of Care [facility]) - Discharge Note   Patient Details  Name: Mike Moreno MRN: [id number] Date of Birth: August 21, 1945  [facility] CM/SW Contact:  Orelia Binet, RN Phone Number: 09/06/2023, 9:17 AM   Clinical Narrative:   Patient medically ready to discharge to [facility]. Room provided. RN calling report. TOC following to schedule EMS.     Final next level of care: Skilled Nursing Facility Barriers to Discharge: Barriers Resolved   Patient Goals and CMS Choice Patient states their goals for this hospitalization and ongoing recovery are:: Agreeable to SNF CMS Medicare.gov Compare Post Acute Care list provided to:: Patient Choice offered to / list presented to : Patient     Discharge Placement               Patient to be transferred to facility by: EMS   Patient and family notified of of transfer: 09/06/23  Discharge Plan and Services Additional resources added to the After Visit Summary for           Social Drivers of Health (SDOH) Interventions SDOH Screenings   Food Insecurity: Food Insecurity Present (08/31/2023)  Housing: Low Risk  (08/31/2023)  Transportation Needs: No Transportation Needs (08/31/2023)  Utilities: Not At Risk (08/31/2023)  Depression (PHQ2-9): Low Risk  (02/22/2023)  Financial Resource Strain: Low Risk  (04/17/2021)   Received from [facility]  Social Connections: Socially Isolated (08/31/2023)  Stress: No Stress Concern Present (04/17/2021)   Received from [facility]  Tobacco Use: Medium Risk (09/04/2023)

## 2023-09-08 LAB — CYTOLOGY - NON PAP

## 2023-09-09 LAB — AEROBIC/ANAEROBIC CULTURE W GRAM STAIN (SURGICAL/DEEP WOUND)
Culture: NO GROWTH
Gram Stain: NONE SEEN

## 2023-09-09 LAB — CULTURE, BODY FLUID W GRAM STAIN -BOTTLE: Culture: NO GROWTH

## 2023-09-25 ENCOUNTER — Inpatient Hospital Stay: Attending: Oncology

## 2023-09-25 DIAGNOSIS — D696 Thrombocytopenia, unspecified: Secondary | ICD-10-CM | POA: Insufficient documentation

## 2023-09-25 DIAGNOSIS — D649 Anemia, unspecified: Secondary | ICD-10-CM | POA: Insufficient documentation

## 2023-09-25 DIAGNOSIS — Z79899 Other long term (current) drug therapy: Secondary | ICD-10-CM | POA: Diagnosis not present

## 2023-09-25 LAB — COMPREHENSIVE METABOLIC PANEL WITH GFR
ALT: 28 U/L (ref 0–44)
AST: 52 U/L — ABNORMAL HIGH (ref 15–41)
Albumin: 2.5 g/dL — ABNORMAL LOW (ref 3.5–5.0)
Alkaline Phosphatase: 97 U/L (ref 38–126)
Anion gap: 10 (ref 5–15)
BUN: 60 mg/dL — ABNORMAL HIGH (ref 8–23)
CO2: 20 mmol/L — ABNORMAL LOW (ref 22–32)
Calcium: 8.7 mg/dL — ABNORMAL LOW (ref 8.9–10.3)
Chloride: 105 mmol/L (ref 98–111)
Creatinine, Ser: 2.98 mg/dL — ABNORMAL HIGH (ref 0.61–1.24)
GFR, Estimated: 21 mL/min — ABNORMAL LOW (ref >60)
Glucose, Bld: 149 mg/dL — ABNORMAL HIGH (ref 70–99)
Potassium: 4.9 mmol/L (ref 3.5–5.1)
Sodium: 135 mmol/L (ref 135–145)
Total Bilirubin: 2.7 mg/dL — ABNORMAL HIGH (ref 0.0–1.2)
Total Protein: 6.5 g/dL (ref 6.5–8.1)

## 2023-09-25 LAB — CBC WITH DIFFERENTIAL/PLATELET
Abs Immature Granulocytes: 0.03 10*3/uL (ref 0.00–0.07)
Basophils Absolute: 0 10*3/uL (ref 0.0–0.1)
Basophils Relative: 0 %
Eosinophils Absolute: 0.4 10*3/uL (ref 0.0–0.5)
Eosinophils Relative: 5 %
HCT: 30.8 % — ABNORMAL LOW (ref 39.0–52.0)
Hemoglobin: 10.4 g/dL — ABNORMAL LOW (ref 13.0–17.0)
Immature Granulocytes: 0 %
Lymphocytes Relative: 20 %
Lymphs Abs: 1.3 10*3/uL (ref 0.7–4.0)
MCH: 37.7 pg — ABNORMAL HIGH (ref 26.0–34.0)
MCHC: 33.8 g/dL (ref 30.0–36.0)
MCV: 111.6 fL — ABNORMAL HIGH (ref 80.0–100.0)
Monocytes Absolute: 0.9 10*3/uL (ref 0.1–1.0)
Monocytes Relative: 13 %
Neutro Abs: 4.1 10*3/uL (ref 1.7–7.7)
Neutrophils Relative %: 62 %
Platelets: 81 10*3/uL — ABNORMAL LOW (ref 150–400)
RBC: 2.76 MIL/uL — ABNORMAL LOW (ref 4.22–5.81)
RDW: 15 % (ref 11.5–15.5)
Smear Review: DECREASED
WBC: 6.7 10*3/uL (ref 4.0–10.5)
nRBC: 0 % (ref 0.0–0.2)

## 2023-09-25 LAB — IRON AND TIBC
Iron: 96 ug/dL (ref 45–182)
Saturation Ratios: 50 % — ABNORMAL HIGH (ref 17.9–39.5)
TIBC: 192 ug/dL — ABNORMAL LOW (ref 250–450)
UIBC: 96 ug/dL

## 2023-09-25 LAB — VITAMIN B12: Vitamin B-12: 1247 pg/mL — ABNORMAL HIGH (ref 180–914)

## 2023-09-25 LAB — FERRITIN: Ferritin: 574 ng/mL — ABNORMAL HIGH (ref 24–336)

## 2023-09-25 LAB — FOLATE: Folate: 10.9 ng/mL (ref >5.9)

## 2023-09-28 DIAGNOSIS — N1832 Chronic kidney disease, stage 3b: Secondary | ICD-10-CM | POA: Diagnosis not present

## 2023-09-28 DIAGNOSIS — N400 Enlarged prostate without lower urinary tract symptoms: Secondary | ICD-10-CM | POA: Diagnosis not present

## 2023-09-28 DIAGNOSIS — E8809 Other disorders of plasma-protein metabolism, not elsewhere classified: Secondary | ICD-10-CM | POA: Diagnosis not present

## 2023-09-28 DIAGNOSIS — N178 Other acute kidney failure: Secondary | ICD-10-CM | POA: Diagnosis not present

## 2023-09-28 DIAGNOSIS — K7030 Alcoholic cirrhosis of liver without ascites: Secondary | ICD-10-CM | POA: Diagnosis not present

## 2023-09-28 DIAGNOSIS — N189 Chronic kidney disease, unspecified: Secondary | ICD-10-CM | POA: Diagnosis not present

## 2023-09-28 DIAGNOSIS — R4189 Other symptoms and signs involving cognitive functions and awareness: Secondary | ICD-10-CM | POA: Diagnosis not present

## 2023-09-28 DIAGNOSIS — L89159 Pressure ulcer of sacral region, unspecified stage: Secondary | ICD-10-CM | POA: Diagnosis not present

## 2023-09-28 DIAGNOSIS — K7290 Hepatic failure, unspecified without coma: Secondary | ICD-10-CM | POA: Diagnosis not present

## 2023-09-28 DIAGNOSIS — K7682 Hepatic encephalopathy: Secondary | ICD-10-CM | POA: Diagnosis not present

## 2023-09-28 DIAGNOSIS — K7469 Other cirrhosis of liver: Secondary | ICD-10-CM | POA: Diagnosis not present

## 2023-09-28 DIAGNOSIS — R001 Bradycardia, unspecified: Secondary | ICD-10-CM | POA: Diagnosis not present

## 2023-09-28 DIAGNOSIS — I959 Hypotension, unspecified: Secondary | ICD-10-CM | POA: Diagnosis not present

## 2023-09-28 DIAGNOSIS — R531 Weakness: Secondary | ICD-10-CM | POA: Diagnosis not present

## 2023-09-28 DIAGNOSIS — D696 Thrombocytopenia, unspecified: Secondary | ICD-10-CM | POA: Diagnosis not present

## 2023-09-28 DIAGNOSIS — K766 Portal hypertension: Secondary | ICD-10-CM | POA: Diagnosis not present

## 2023-09-28 DIAGNOSIS — K7031 Alcoholic cirrhosis of liver with ascites: Secondary | ICD-10-CM | POA: Diagnosis not present

## 2023-09-28 DIAGNOSIS — K3189 Other diseases of stomach and duodenum: Secondary | ICD-10-CM | POA: Diagnosis not present

## 2023-09-28 DIAGNOSIS — Z7901 Long term (current) use of anticoagulants: Secondary | ICD-10-CM | POA: Diagnosis not present

## 2023-09-28 DIAGNOSIS — R4182 Altered mental status, unspecified: Secondary | ICD-10-CM | POA: Diagnosis not present

## 2023-09-28 DIAGNOSIS — I129 Hypertensive chronic kidney disease with stage 1 through stage 4 chronic kidney disease, or unspecified chronic kidney disease: Secondary | ICD-10-CM | POA: Diagnosis not present

## 2023-09-28 DIAGNOSIS — Z66 Do not resuscitate: Secondary | ICD-10-CM | POA: Diagnosis not present

## 2023-09-28 DIAGNOSIS — K7460 Unspecified cirrhosis of liver: Secondary | ICD-10-CM | POA: Diagnosis not present

## 2023-09-28 DIAGNOSIS — E875 Hyperkalemia: Secondary | ICD-10-CM | POA: Diagnosis not present

## 2023-09-28 DIAGNOSIS — L89150 Pressure ulcer of sacral region, unstageable: Secondary | ICD-10-CM | POA: Diagnosis not present

## 2023-09-28 DIAGNOSIS — K7210 Chronic hepatic failure without coma: Secondary | ICD-10-CM | POA: Diagnosis not present

## 2023-09-28 DIAGNOSIS — N179 Acute kidney failure, unspecified: Secondary | ICD-10-CM | POA: Diagnosis not present

## 2023-09-28 DIAGNOSIS — Z79899 Other long term (current) drug therapy: Secondary | ICD-10-CM | POA: Diagnosis not present

## 2023-09-29 DIAGNOSIS — L89150 Pressure ulcer of sacral region, unstageable: Secondary | ICD-10-CM | POA: Diagnosis not present

## 2023-09-29 DIAGNOSIS — K7682 Hepatic encephalopathy: Secondary | ICD-10-CM | POA: Diagnosis not present

## 2023-09-29 DIAGNOSIS — Z7901 Long term (current) use of anticoagulants: Secondary | ICD-10-CM | POA: Diagnosis not present

## 2023-09-29 DIAGNOSIS — D696 Thrombocytopenia, unspecified: Secondary | ICD-10-CM | POA: Diagnosis not present

## 2023-09-29 DIAGNOSIS — N189 Chronic kidney disease, unspecified: Secondary | ICD-10-CM | POA: Diagnosis not present

## 2023-09-29 DIAGNOSIS — N179 Acute kidney failure, unspecified: Secondary | ICD-10-CM | POA: Diagnosis not present

## 2023-09-29 DIAGNOSIS — Z79899 Other long term (current) drug therapy: Secondary | ICD-10-CM | POA: Diagnosis not present

## 2023-09-29 DIAGNOSIS — K7290 Hepatic failure, unspecified without coma: Secondary | ICD-10-CM | POA: Diagnosis not present

## 2023-09-30 DIAGNOSIS — N179 Acute kidney failure, unspecified: Secondary | ICD-10-CM | POA: Diagnosis not present

## 2023-09-30 DIAGNOSIS — K7682 Hepatic encephalopathy: Secondary | ICD-10-CM | POA: Diagnosis not present

## 2023-09-30 DIAGNOSIS — Z79899 Other long term (current) drug therapy: Secondary | ICD-10-CM | POA: Diagnosis not present

## 2023-09-30 DIAGNOSIS — D696 Thrombocytopenia, unspecified: Secondary | ICD-10-CM | POA: Diagnosis not present

## 2023-09-30 DIAGNOSIS — L89159 Pressure ulcer of sacral region, unspecified stage: Secondary | ICD-10-CM | POA: Diagnosis not present

## 2023-09-30 DIAGNOSIS — K7290 Hepatic failure, unspecified without coma: Secondary | ICD-10-CM | POA: Diagnosis not present

## 2023-09-30 DIAGNOSIS — N189 Chronic kidney disease, unspecified: Secondary | ICD-10-CM | POA: Diagnosis not present

## 2023-10-01 DIAGNOSIS — N179 Acute kidney failure, unspecified: Secondary | ICD-10-CM | POA: Diagnosis not present

## 2023-10-01 DIAGNOSIS — K7290 Hepatic failure, unspecified without coma: Secondary | ICD-10-CM | POA: Diagnosis not present

## 2023-10-01 DIAGNOSIS — Z79899 Other long term (current) drug therapy: Secondary | ICD-10-CM | POA: Diagnosis not present

## 2023-10-01 DIAGNOSIS — D696 Thrombocytopenia, unspecified: Secondary | ICD-10-CM | POA: Diagnosis not present

## 2023-10-01 DIAGNOSIS — K7682 Hepatic encephalopathy: Secondary | ICD-10-CM | POA: Diagnosis not present

## 2023-10-01 DIAGNOSIS — L89159 Pressure ulcer of sacral region, unspecified stage: Secondary | ICD-10-CM | POA: Diagnosis not present

## 2023-10-01 DIAGNOSIS — N189 Chronic kidney disease, unspecified: Secondary | ICD-10-CM | POA: Diagnosis not present

## 2023-10-02 ENCOUNTER — Inpatient Hospital Stay: Admitting: Oncology

## 2023-10-02 DIAGNOSIS — N1832 Chronic kidney disease, stage 3b: Secondary | ICD-10-CM | POA: Diagnosis not present

## 2023-10-02 DIAGNOSIS — M6281 Muscle weakness (generalized): Secondary | ICD-10-CM | POA: Diagnosis not present

## 2023-10-02 DIAGNOSIS — N179 Acute kidney failure, unspecified: Secondary | ICD-10-CM | POA: Diagnosis not present

## 2023-10-02 DIAGNOSIS — R531 Weakness: Secondary | ICD-10-CM | POA: Diagnosis not present

## 2023-10-02 DIAGNOSIS — K7469 Other cirrhosis of liver: Secondary | ICD-10-CM | POA: Diagnosis not present

## 2023-10-02 DIAGNOSIS — R944 Abnormal results of kidney function studies: Secondary | ICD-10-CM | POA: Diagnosis not present

## 2023-10-02 DIAGNOSIS — N189 Chronic kidney disease, unspecified: Secondary | ICD-10-CM | POA: Diagnosis not present

## 2023-10-02 DIAGNOSIS — Z7409 Other reduced mobility: Secondary | ICD-10-CM | POA: Diagnosis not present

## 2023-10-02 DIAGNOSIS — L89150 Pressure ulcer of sacral region, unstageable: Secondary | ICD-10-CM | POA: Diagnosis not present

## 2023-10-02 DIAGNOSIS — K7460 Unspecified cirrhosis of liver: Secondary | ICD-10-CM | POA: Diagnosis not present

## 2023-10-02 DIAGNOSIS — K769 Liver disease, unspecified: Secondary | ICD-10-CM | POA: Diagnosis not present

## 2023-10-02 DIAGNOSIS — R5383 Other fatigue: Secondary | ICD-10-CM | POA: Diagnosis not present

## 2023-10-02 DIAGNOSIS — D696 Thrombocytopenia, unspecified: Secondary | ICD-10-CM | POA: Diagnosis not present

## 2023-10-02 DIAGNOSIS — Z79899 Other long term (current) drug therapy: Secondary | ICD-10-CM | POA: Diagnosis not present

## 2023-10-02 DIAGNOSIS — D649 Anemia, unspecified: Secondary | ICD-10-CM | POA: Diagnosis not present

## 2023-10-02 DIAGNOSIS — K7682 Hepatic encephalopathy: Secondary | ICD-10-CM | POA: Diagnosis not present

## 2023-10-02 DIAGNOSIS — N178 Other acute kidney failure: Secondary | ICD-10-CM | POA: Diagnosis not present

## 2023-10-02 DIAGNOSIS — N400 Enlarged prostate without lower urinary tract symptoms: Secondary | ICD-10-CM | POA: Diagnosis not present

## 2023-10-02 DIAGNOSIS — K7031 Alcoholic cirrhosis of liver with ascites: Secondary | ICD-10-CM | POA: Diagnosis not present

## 2023-10-02 DIAGNOSIS — K7030 Alcoholic cirrhosis of liver without ascites: Secondary | ICD-10-CM | POA: Diagnosis not present

## 2023-10-02 DIAGNOSIS — E8809 Other disorders of plasma-protein metabolism, not elsewhere classified: Secondary | ICD-10-CM | POA: Diagnosis not present

## 2023-10-02 DIAGNOSIS — Z515 Encounter for palliative care: Secondary | ICD-10-CM | POA: Diagnosis not present

## 2023-10-02 DIAGNOSIS — K7210 Chronic hepatic failure without coma: Secondary | ICD-10-CM | POA: Diagnosis not present

## 2023-10-02 DIAGNOSIS — K766 Portal hypertension: Secondary | ICD-10-CM | POA: Diagnosis not present

## 2023-10-02 DIAGNOSIS — K3189 Other diseases of stomach and duodenum: Secondary | ICD-10-CM | POA: Diagnosis not present

## 2023-10-02 DIAGNOSIS — N401 Enlarged prostate with lower urinary tract symptoms: Secondary | ICD-10-CM | POA: Diagnosis not present

## 2023-10-04 ENCOUNTER — Inpatient Hospital Stay: Admit: 2023-10-04 | Discharge: 2023-10-04 | Payer: Medicare (Managed Care) | Primary: Family Medicine

## 2023-10-04 DIAGNOSIS — M4646 Discitis, unspecified, lumbar region: Secondary | ICD-10-CM

## 2023-10-04 LAB — SEDIMENTATION RATE (ESR): BKR SEDIMENTATION RATE, ERYTHROCYTE: 9 mm/h (ref 0–20)

## 2023-10-04 LAB — C-REACTIVE PROTEIN     (CRP): BKR C-REACTIVE PROTEIN, HIGH SENSITIVITY: 1 mg/L

## 2023-10-08 NOTE — Progress Notes (Unsigned)
 GI Office Note    Referring Provider: Veda Gerald, MD Primary Care Physician:  Veda Gerald, MD  Primary Gastroenterologist: Rheba Cedar, MD   Chief Complaint   No chief complaint on file.   History of Present Illness   Mike Moreno is a 78 y.o. male presenting today for follow up. Last seen 07/2023. H/O cirrhosis, nonocclusive SMV thrombus, C.diff.   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 Released to SNF.   Readmission to [facility], 6/5-10/02/23 for hepatic encephalopathy, decompensated liver disease. [facility] palliative care to follow patient when he returns to rehab.  Labs    Abd u/s 07/2023: IMPRESSION: 1. Cirrhotic liver.  Ascites. 2. 0.8 x 0.8 x 0.7 cm hypoechoic lesion in the right lobe liver. Recommend comparison with patient's prior CT scan from December 27, 2022 from [facility]. 3. Single focus of ring down artifact in the gallbladder raising the question of adenomyomatosis.   CT A/P with contrast 12/27/22: 1. 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 Small amount of abdominal and pelvic ascites.  2. Focal filling defect in the origin of the superior mesenteric  vein with flow on both sides suggesting nonocclusive thrombus.  3. No evidence of bowel obstruction or inflammation.  4. Aortic atherosclerosis.  5. Prostate gland is enlarged.  6. Nonobstructing stone in the right kidney.    CT A/P without contrast 01/2020 done after a fall, showing cirrhosis. Subsequent CT in 03/2021 with multiple hypodensities in the periphery of the liver, at one time thought worrisome for primary or metastatic neoplasm but now thought to be areas of asymmetry and fatty replacement or areas of hepatitis. Last CT as outlined below.    Hospitalized in 03/2021 with acute renal failure, mental status changes, concern for hepatorenal  syndrome. Concern for possible HCC but MRI not an option due to elevated creatinine. Patient elected to go home with comfort measures and no further work up of liver lesions.   CT Abd without contrast 01/2022: 1. 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 Single subcentimeter low-density lesion in the right  hepatic lobe remains too small to characterize. No definite new  liver lesions identified on noncontrast imaging.  2. Question mild wall thickening of the gastric antrum and proximal  duodenum, which may be secondary to underdistention or  gastritis/duodenitis.  3. Small left pleural effusion with adjacent atelectasis. Volume of  effusion has slightly decreased from prior.  4. Nonobstructing right renal stone.  5. Aortic atherosclerosis (ICD10-I70.0).    His brother had cirrhosis felt to be due to etoh use. Patient states he used to drink regularly but quit in 1983. Remote drug use. Patient still works.    He had thrombocytopenia (40981) in 03/2021. He is on propranolol  10mg  BID but he is unsure purpose. Also on midodrine , furosemide , and spironolactone .    Colonoscopy 11/2016: -one 4mm polyp in rectum, benign -nodular ileal mucosa, benign -surveillance colonoscopy in 5 years   Colonoscopy 05/2022: -four 4-4mm polyps in sigmoid colon, transverse colon and cecum removed.  -multiple tubular adenomas -consider another colonoscopy in five years if overall health permits.   EGD 03/2023: normal esophagus. 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 Duodenal bulbar and D2 erosions s/p bx. Mild inflammation noted on bx but no H.pylori.      Medications   Current Outpatient Medications  Medication Sig Dispense Refill  alfuzosin  (UROXATRAL ) 10 MG 24 hr tablet Take 10 mg by mouth daily.     bumetanide (BUMEX) 1 MG tablet Take 1 mg by mouth daily.     ferrous sulfate  325 (65 FE) MG EC tablet Take 325 mg by mouth daily with breakfast.      gabapentin  (NEURONTIN ) 100 MG capsule Take 100 mg by mouth at bedtime.     lactulose  (CONSTULOSE ) 10 GM/15ML solution Take 30 mLs (20 g total) by mouth 3 (three) times daily.     midodrine  (PROAMATINE ) 10 MG tablet Take 1 tablet (10 mg total) by mouth 3 (three) times daily with meals.     neomycin (MYCIFRADIN) 500 MG tablet Take 500 mg by mouth 3 (three) times daily.     nitroGLYCERIN (NITROSTAT) 0.4 MG SL tablet Place 0.4 mg under the tongue as needed for chest pain.     pantoprazole  (PROTONIX ) 40 MG tablet Take 1 tablet (40 mg total) by mouth 2 (two) times daily.     promethazine (PHENERGAN) 6.25 MG/5ML solution Take 6.25 mg by mouth 4 (four) times daily.     propranolol  (INDERAL ) 10 MG tablet Take 10 mg by mouth 2 (two) times daily.     sodium bicarbonate  650 MG tablet Take 650 mg by mouth 2 (two) times daily.     spironolactone  (ALDACTONE ) 25 MG tablet Take 25 mg by mouth daily.     traZODone  (DESYREL ) 50 MG tablet Take 50 mg by mouth at bedtime.     No current facility-administered medications for this visit.    Allergies   Allergies as of 10/09/2023   (No Known Allergies)     Past Medical History   Past Medical History:  Diagnosis Date   Anasarca    BPH (benign prostatic hyperplasia)    Cirrhosis (HCC)    HTN (hypertension)    Hyperlipidemia    Neuropathy     Past Surgical History   Past Surgical History:  Procedure Laterality Date   bilateral inguinal hernias     BIOPSY  12/07/2016   Procedure: BIOPSY;  Surgeon: Suzette Espy, MD;  [facility]: [facility];  Service: Endoscopy;;  ileocecal valve   BIOPSY  03/29/2023   Procedure: BIOPSY;  Surgeon: Suzette Espy, MD;  [facility]: [facility];  Service: Endoscopy;;   COLONOSCOPY  08/2011   According to [facility] notes, 2 tubular adenomatous colon polyps removed next surveillance colonoscopy in 5 years   COLONOSCOPY N/A 12/07/2016   Procedure: COLONOSCOPY;  Surgeon: Suzette Espy, MD;  Location: [facility];   Service: Endoscopy;  Laterality: N/A;  1030    COLONOSCOPY WITH PROPOFOL  N/A 06/22/2022   Procedure: COLONOSCOPY WITH PROPOFOL ;  Surgeon: Suzette Espy, MD;  [facility]: [facility];  Service: Endoscopy;  Laterality: N/A;  1:45 pm, pt can't move transportation   ESOPHAGOGASTRODUODENOSCOPY (EGD) WITH PROPOFOL  N/A 03/29/2023   Procedure: ESOPHAGOGASTRODUODENOSCOPY (EGD) WITH PROPOFOL ;  Surgeon: Suzette Espy, MD;  [facility]: [facility];  Service: Endoscopy;  Laterality: N/A;  2:00 pm, asa 3   HAND SURGERY     traumatic injury   POLYPECTOMY  12/07/2016   Procedure: POLYPECTOMY;  Surgeon: Suzette Espy, MD;  [facility];  Service: Endoscopy;;  colon   POLYPECTOMY  06/22/2022   Procedure: POLYPECTOMY;  Surgeon: Suzette Espy, MD;  [facility];  Service: Endoscopy;;   UMBILICAL HERNIA REPAIR      Past Family History   Family History  Problem Relation  Age of Onset   Cirrhosis Brother    Colon cancer Neg Hx     Past Social History   Social History   Socioeconomic History   Marital status: Single    Spouse name: Not on file   Number of children: Not on file   Years of education: Not on file   Highest education level: Not on file  Occupational History   Not on file  Tobacco Use   Smoking status: Former    Types: Cigarettes, Cigars   Smokeless tobacco: Never   Tobacco comments:    quit 2015  Vaping Use   Vaping status: Never Used  Substance and Sexual Activity   Alcohol use: No    Comment: some etoh 20 years ago   Drug use: No    Comment: not since 2005   Sexual activity: Not Currently  Other Topics Concern   Not on file  Social History Narrative   Not on file   Social Drivers of Health   Financial Resource Strain: Low Risk  (04/17/2021)   Received from [facility]   Overall Financial Resource Strain (CARDIA)    Difficulty of Paying Living Expenses: Not hard at all  Food Insecurity: Food Insecurity Present (08/31/2023)   Hunger Vital  Sign    Worried About Running Out of Food in the Last Year: Sometimes true    Ran Out of Food in the Last Year: Sometimes true  Transportation Needs: No Transportation Needs (10/02/2023)   Received from [facility] - Transportation    In the past 12 months, has lack of transportation kept you from medical appointments or from getting medications?: No    In the past 12 months, has lack of transportation kept you from meetings, work, or from getting things needed for daily living?: No  Physical Activity: Not on file  Stress: No Stress Concern Present (04/17/2021)   Received from [facility] - Occupational Stress Questionnaire    Feeling of Stress : Not at all  Social Connections: Socially Isolated (08/31/2023)   Social Connection and Isolation Panel    Frequency of Communication with Friends and Family: Twice a week    Frequency of Social Gatherings with Friends and Family: Once a week    Attends Religious Services: Never    Database administrator or Organizations: No    Attends Banker Meetings: Never    Marital Status: Separated  Intimate Partner Violence: Not At Risk (08/31/2023)   Humiliation, Afraid, Rape, and Kick questionnaire    Fear of Current or Ex-Partner: No    Emotionally Abused: No    Physically Abused: No    Sexually Abused: No    Review of Systems   General: Negative for anorexia, weight loss, fever, chills, fatigue, weakness. ENT: Negative for hoarseness, difficulty swallowing , nasal congestion. CV: Negative for chest pain, angina, palpitations, dyspnea on exertion, peripheral edema.  Respiratory: Negative for dyspnea at rest, dyspnea on exertion, cough, sputum, wheezing.  GI: See history of present illness. GU:  Negative for dysuria, hematuria, urinary incontinence, urinary frequency, nocturnal urination.  Endo: Negative for unusual weight change.     Physical Exam   There were no vitals taken  for this visit.   General: Well-nourished, well-developed in no acute distress.  Eyes: No icterus. Mouth: Oropharyngeal mucosa moist and pink   Lungs: Clear to auscultation bilaterally.  Heart: Regular rate and rhythm, no murmurs rubs or  gallops.  Abdomen: Bowel sounds are normal, nontender, nondistended, no hepatosplenomegaly or masses,  no abdominal bruits or hernia , no rebound or guarding.  Rectal: not performed Extremities: No lower extremity edema. No clubbing or deformities. Neuro: Alert and oriented x 4   Skin: Warm and dry, no jaundice.   Psych: Alert and cooperative, normal mood and affect.  Labs   *** Imaging Studies   No results found.  Assessment/Plan:    Cirrhosis: with thrombocytopenia, portal HTN gastropathy.  He has been vaccinated for both Hep A and Hep B. History of decompensated disease with remote concern for hepatorenal syndrome and possible HCC but MRI could not be done due to elevated creatinine at that time. Patient elected to go home with comfort measures and had no further GI/hepatology evaluation until we saw him earlier in 2024. Follow up liver imaging without concern for suspicious liver lesions -current MELD 3.0 of 24 (up from 20). -due for hepatoma screening in 06/2023 -minimal lower ext edema today. Will continue current diuretic regimen -2 gram sodium diet   Anemia: likely multifactorial. Cannot exclude chronic occult GI bleeding given recent EGD findings. Followed by hematology. Recent Hgb slightly improved. No overt GI bleeding.   Diarrhea: history of Cdiff. No longer on lactulose . Did not complete stool test previously.  -recheck Cdiff GDH   Decline in eGFR: -abdominal ultrasound planned for hepatoma screening, check kidneys at that time -referral to nephrology         ***   Trudie Fuse. Harles Lied, MHS, PA-C [facility]

## 2023-10-09 ENCOUNTER — Encounter: Payer: Self-pay | Admitting: Gastroenterology

## 2023-10-09 ENCOUNTER — Ambulatory Visit: Admitting: Gastroenterology

## 2023-10-09 VITALS — BP 113/57 | HR 85 | Temp 97.8°F | Ht 71.0 in | Wt 193.0 lb

## 2023-10-09 DIAGNOSIS — K766 Portal hypertension: Secondary | ICD-10-CM | POA: Diagnosis not present

## 2023-10-09 DIAGNOSIS — K7290 Hepatic failure, unspecified without coma: Secondary | ICD-10-CM

## 2023-10-09 DIAGNOSIS — K7460 Unspecified cirrhosis of liver: Secondary | ICD-10-CM

## 2023-10-09 DIAGNOSIS — D696 Thrombocytopenia, unspecified: Secondary | ICD-10-CM | POA: Diagnosis not present

## 2023-10-09 DIAGNOSIS — K769 Liver disease, unspecified: Secondary | ICD-10-CM

## 2023-10-09 DIAGNOSIS — R944 Abnormal results of kidney function studies: Secondary | ICD-10-CM

## 2023-10-09 DIAGNOSIS — K3189 Other diseases of stomach and duodenum: Secondary | ICD-10-CM

## 2023-10-09 DIAGNOSIS — D649 Anemia, unspecified: Secondary | ICD-10-CM | POA: Diagnosis not present

## 2023-10-09 DIAGNOSIS — D135 Benign neoplasm of extrahepatic bile ducts: Secondary | ICD-10-CM | POA: Insufficient documentation

## 2023-10-09 NOTE — Patient Instructions (Addendum)
 Please complete lab work in two weeks. See orders provided today.  We will decide after lab work, if we should do CT scan without contrast or with contrast, or follow up ultrasound to follow liver lesion.   Please continue lactulose  with goal of 2-3 soft stools daily.   Please reach out with any questions or concerns.

## 2023-10-12 DIAGNOSIS — N401 Enlarged prostate with lower urinary tract symptoms: Secondary | ICD-10-CM | POA: Diagnosis not present

## 2023-10-12 DIAGNOSIS — M6281 Muscle weakness (generalized): Secondary | ICD-10-CM | POA: Diagnosis not present

## 2023-10-12 DIAGNOSIS — K766 Portal hypertension: Secondary | ICD-10-CM | POA: Diagnosis not present

## 2023-10-12 DIAGNOSIS — R5383 Other fatigue: Secondary | ICD-10-CM | POA: Diagnosis not present

## 2023-10-12 DIAGNOSIS — Z515 Encounter for palliative care: Secondary | ICD-10-CM | POA: Diagnosis not present

## 2023-10-12 DIAGNOSIS — Z7409 Other reduced mobility: Secondary | ICD-10-CM | POA: Diagnosis not present

## 2023-10-12 DIAGNOSIS — K7031 Alcoholic cirrhosis of liver with ascites: Secondary | ICD-10-CM | POA: Diagnosis not present

## 2023-10-14 DIAGNOSIS — N178 Other acute kidney failure: Secondary | ICD-10-CM | POA: Diagnosis not present

## 2023-10-14 DIAGNOSIS — K7682 Hepatic encephalopathy: Secondary | ICD-10-CM | POA: Diagnosis not present

## 2023-10-14 DIAGNOSIS — R531 Weakness: Secondary | ICD-10-CM | POA: Diagnosis not present

## 2023-10-14 DIAGNOSIS — K7210 Chronic hepatic failure without coma: Secondary | ICD-10-CM | POA: Diagnosis not present

## 2023-10-14 DIAGNOSIS — K7030 Alcoholic cirrhosis of liver without ascites: Secondary | ICD-10-CM | POA: Diagnosis not present

## 2023-10-15 DIAGNOSIS — D696 Thrombocytopenia, unspecified: Secondary | ICD-10-CM | POA: Diagnosis not present

## 2023-10-15 DIAGNOSIS — D684 Acquired coagulation factor deficiency: Secondary | ICD-10-CM | POA: Diagnosis not present

## 2023-10-15 DIAGNOSIS — K7682 Hepatic encephalopathy: Secondary | ICD-10-CM | POA: Diagnosis not present

## 2023-10-15 DIAGNOSIS — K7210 Chronic hepatic failure without coma: Secondary | ICD-10-CM | POA: Diagnosis not present

## 2023-10-15 DIAGNOSIS — N1832 Chronic kidney disease, stage 3b: Secondary | ICD-10-CM | POA: Diagnosis not present

## 2023-10-15 DIAGNOSIS — K766 Portal hypertension: Secondary | ICD-10-CM | POA: Diagnosis not present

## 2023-10-15 DIAGNOSIS — K7031 Alcoholic cirrhosis of liver with ascites: Secondary | ICD-10-CM | POA: Diagnosis not present

## 2023-10-15 DIAGNOSIS — L89620 Pressure ulcer of left heel, unstageable: Secondary | ICD-10-CM | POA: Diagnosis not present

## 2023-10-15 DIAGNOSIS — K551 Chronic vascular disorders of intestine: Secondary | ICD-10-CM | POA: Diagnosis not present

## 2023-10-16 NOTE — Transitions of Care (Post Inpatient/ED Visit) (Signed)
 10/16/2023  Patient ID: Mike Moreno, male   DOB: [birth year], 78 y.o.   MRN: [id number]  Chart Review for transitions of care. Patient not eligible for transition of care program as patient discharged from SNF.   Emory Gallentine J. Buzz Axel RN, MSN [facility], [facility] RN Care Manager Direct Dial: [phone]  Fax: [phone] Website: [email or web address]

## 2023-10-18 DIAGNOSIS — K7210 Chronic hepatic failure without coma: Secondary | ICD-10-CM | POA: Diagnosis not present

## 2023-10-18 DIAGNOSIS — L89620 Pressure ulcer of left heel, unstageable: Secondary | ICD-10-CM | POA: Diagnosis not present

## 2023-10-18 DIAGNOSIS — K551 Chronic vascular disorders of intestine: Secondary | ICD-10-CM | POA: Diagnosis not present

## 2023-10-18 DIAGNOSIS — D684 Acquired coagulation factor deficiency: Secondary | ICD-10-CM | POA: Diagnosis not present

## 2023-10-18 DIAGNOSIS — K766 Portal hypertension: Secondary | ICD-10-CM | POA: Diagnosis not present

## 2023-10-18 DIAGNOSIS — D696 Thrombocytopenia, unspecified: Secondary | ICD-10-CM | POA: Diagnosis not present

## 2023-10-18 DIAGNOSIS — K7031 Alcoholic cirrhosis of liver with ascites: Secondary | ICD-10-CM | POA: Diagnosis not present

## 2023-10-18 DIAGNOSIS — K7682 Hepatic encephalopathy: Secondary | ICD-10-CM | POA: Diagnosis not present

## 2023-10-18 DIAGNOSIS — N1832 Chronic kidney disease, stage 3b: Secondary | ICD-10-CM | POA: Diagnosis not present

## 2023-10-19 DIAGNOSIS — Z7401 Bed confinement status: Secondary | ICD-10-CM | POA: Diagnosis not present

## 2023-10-19 DIAGNOSIS — I959 Hypotension, unspecified: Secondary | ICD-10-CM | POA: Diagnosis not present

## 2023-10-19 DIAGNOSIS — K7469 Other cirrhosis of liver: Secondary | ICD-10-CM | POA: Diagnosis not present

## 2023-10-19 DIAGNOSIS — N1832 Chronic kidney disease, stage 3b: Secondary | ICD-10-CM | POA: Diagnosis not present

## 2023-10-19 DIAGNOSIS — M5459 Other low back pain: Secondary | ICD-10-CM | POA: Diagnosis not present

## 2023-10-19 DIAGNOSIS — L8990 Pressure ulcer of unspecified site, unspecified stage: Secondary | ICD-10-CM | POA: Diagnosis not present

## 2023-10-19 DIAGNOSIS — Z6830 Body mass index (BMI) 30.0-30.9, adult: Secondary | ICD-10-CM | POA: Diagnosis not present

## 2023-10-19 DIAGNOSIS — I10 Essential (primary) hypertension: Secondary | ICD-10-CM | POA: Diagnosis not present

## 2023-10-19 DIAGNOSIS — R609 Edema, unspecified: Secondary | ICD-10-CM | POA: Diagnosis not present

## 2023-10-20 DIAGNOSIS — K766 Portal hypertension: Secondary | ICD-10-CM | POA: Diagnosis not present

## 2023-10-20 DIAGNOSIS — N1832 Chronic kidney disease, stage 3b: Secondary | ICD-10-CM | POA: Diagnosis not present

## 2023-10-20 DIAGNOSIS — L89620 Pressure ulcer of left heel, unstageable: Secondary | ICD-10-CM | POA: Diagnosis not present

## 2023-10-20 DIAGNOSIS — D684 Acquired coagulation factor deficiency: Secondary | ICD-10-CM | POA: Diagnosis not present

## 2023-10-20 DIAGNOSIS — D696 Thrombocytopenia, unspecified: Secondary | ICD-10-CM | POA: Diagnosis not present

## 2023-10-20 DIAGNOSIS — K7031 Alcoholic cirrhosis of liver with ascites: Secondary | ICD-10-CM | POA: Diagnosis not present

## 2023-10-20 DIAGNOSIS — K551 Chronic vascular disorders of intestine: Secondary | ICD-10-CM | POA: Diagnosis not present

## 2023-10-20 DIAGNOSIS — K7210 Chronic hepatic failure without coma: Secondary | ICD-10-CM | POA: Diagnosis not present

## 2023-10-20 DIAGNOSIS — K7682 Hepatic encephalopathy: Secondary | ICD-10-CM | POA: Diagnosis not present

## 2023-10-23 ENCOUNTER — Encounter
Admit: 2023-10-23 | Payer: PRIVATE HEALTH INSURANCE | Attending: Vascular and Interventional Radiology | Primary: Family Medicine

## 2023-10-25 DIAGNOSIS — K7682 Hepatic encephalopathy: Secondary | ICD-10-CM | POA: Diagnosis not present

## 2023-10-25 DIAGNOSIS — K7031 Alcoholic cirrhosis of liver with ascites: Secondary | ICD-10-CM | POA: Diagnosis not present

## 2023-10-25 DIAGNOSIS — K551 Chronic vascular disorders of intestine: Secondary | ICD-10-CM | POA: Diagnosis not present

## 2023-10-25 DIAGNOSIS — K7210 Chronic hepatic failure without coma: Secondary | ICD-10-CM | POA: Diagnosis not present

## 2023-10-25 DIAGNOSIS — L89620 Pressure ulcer of left heel, unstageable: Secondary | ICD-10-CM | POA: Diagnosis not present

## 2023-10-25 DIAGNOSIS — D696 Thrombocytopenia, unspecified: Secondary | ICD-10-CM | POA: Diagnosis not present

## 2023-10-25 DIAGNOSIS — D684 Acquired coagulation factor deficiency: Secondary | ICD-10-CM | POA: Diagnosis not present

## 2023-10-25 DIAGNOSIS — K766 Portal hypertension: Secondary | ICD-10-CM | POA: Diagnosis not present

## 2023-10-25 DIAGNOSIS — N1832 Chronic kidney disease, stage 3b: Secondary | ICD-10-CM | POA: Diagnosis not present

## 2023-10-30 DIAGNOSIS — D696 Thrombocytopenia, unspecified: Secondary | ICD-10-CM | POA: Diagnosis not present

## 2023-10-30 DIAGNOSIS — L89620 Pressure ulcer of left heel, unstageable: Secondary | ICD-10-CM | POA: Diagnosis not present

## 2023-10-30 DIAGNOSIS — K7031 Alcoholic cirrhosis of liver with ascites: Secondary | ICD-10-CM | POA: Diagnosis not present

## 2023-10-30 DIAGNOSIS — D684 Acquired coagulation factor deficiency: Secondary | ICD-10-CM | POA: Diagnosis not present

## 2023-10-30 DIAGNOSIS — K7210 Chronic hepatic failure without coma: Secondary | ICD-10-CM | POA: Diagnosis not present

## 2023-10-30 DIAGNOSIS — K551 Chronic vascular disorders of intestine: Secondary | ICD-10-CM | POA: Diagnosis not present

## 2023-10-30 DIAGNOSIS — N1832 Chronic kidney disease, stage 3b: Secondary | ICD-10-CM | POA: Diagnosis not present

## 2023-10-30 DIAGNOSIS — K766 Portal hypertension: Secondary | ICD-10-CM | POA: Diagnosis not present

## 2023-10-30 DIAGNOSIS — K7682 Hepatic encephalopathy: Secondary | ICD-10-CM | POA: Diagnosis not present

## 2023-10-31 DIAGNOSIS — L89620 Pressure ulcer of left heel, unstageable: Secondary | ICD-10-CM | POA: Diagnosis not present

## 2023-10-31 DIAGNOSIS — N1832 Chronic kidney disease, stage 3b: Secondary | ICD-10-CM | POA: Diagnosis not present

## 2023-10-31 DIAGNOSIS — D684 Acquired coagulation factor deficiency: Secondary | ICD-10-CM | POA: Diagnosis not present

## 2023-10-31 DIAGNOSIS — K7682 Hepatic encephalopathy: Secondary | ICD-10-CM | POA: Diagnosis not present

## 2023-10-31 DIAGNOSIS — K7210 Chronic hepatic failure without coma: Secondary | ICD-10-CM | POA: Diagnosis not present

## 2023-10-31 DIAGNOSIS — K766 Portal hypertension: Secondary | ICD-10-CM | POA: Diagnosis not present

## 2023-10-31 DIAGNOSIS — K551 Chronic vascular disorders of intestine: Secondary | ICD-10-CM | POA: Diagnosis not present

## 2023-10-31 DIAGNOSIS — K7031 Alcoholic cirrhosis of liver with ascites: Secondary | ICD-10-CM | POA: Diagnosis not present

## 2023-10-31 DIAGNOSIS — D696 Thrombocytopenia, unspecified: Secondary | ICD-10-CM | POA: Diagnosis not present

## 2023-11-02 DIAGNOSIS — K766 Portal hypertension: Secondary | ICD-10-CM | POA: Diagnosis not present

## 2023-11-02 DIAGNOSIS — K7210 Chronic hepatic failure without coma: Secondary | ICD-10-CM | POA: Diagnosis not present

## 2023-11-02 DIAGNOSIS — N1832 Chronic kidney disease, stage 3b: Secondary | ICD-10-CM | POA: Diagnosis not present

## 2023-11-02 DIAGNOSIS — D684 Acquired coagulation factor deficiency: Secondary | ICD-10-CM | POA: Diagnosis not present

## 2023-11-02 DIAGNOSIS — K551 Chronic vascular disorders of intestine: Secondary | ICD-10-CM | POA: Diagnosis not present

## 2023-11-02 DIAGNOSIS — L89620 Pressure ulcer of left heel, unstageable: Secondary | ICD-10-CM | POA: Diagnosis not present

## 2023-11-02 DIAGNOSIS — K7031 Alcoholic cirrhosis of liver with ascites: Secondary | ICD-10-CM | POA: Diagnosis not present

## 2023-11-02 DIAGNOSIS — D696 Thrombocytopenia, unspecified: Secondary | ICD-10-CM | POA: Diagnosis not present

## 2023-11-02 DIAGNOSIS — K7682 Hepatic encephalopathy: Secondary | ICD-10-CM | POA: Diagnosis not present

## 2023-11-03 DIAGNOSIS — K7031 Alcoholic cirrhosis of liver with ascites: Secondary | ICD-10-CM | POA: Diagnosis not present

## 2023-11-03 DIAGNOSIS — N1832 Chronic kidney disease, stage 3b: Secondary | ICD-10-CM | POA: Diagnosis not present

## 2023-11-03 DIAGNOSIS — K7210 Chronic hepatic failure without coma: Secondary | ICD-10-CM | POA: Diagnosis not present

## 2023-11-03 DIAGNOSIS — K766 Portal hypertension: Secondary | ICD-10-CM | POA: Diagnosis not present

## 2023-11-03 DIAGNOSIS — D684 Acquired coagulation factor deficiency: Secondary | ICD-10-CM | POA: Diagnosis not present

## 2023-11-03 DIAGNOSIS — K7682 Hepatic encephalopathy: Secondary | ICD-10-CM | POA: Diagnosis not present

## 2023-11-03 DIAGNOSIS — K551 Chronic vascular disorders of intestine: Secondary | ICD-10-CM | POA: Diagnosis not present

## 2023-11-03 DIAGNOSIS — L89620 Pressure ulcer of left heel, unstageable: Secondary | ICD-10-CM | POA: Diagnosis not present

## 2023-11-03 DIAGNOSIS — D696 Thrombocytopenia, unspecified: Secondary | ICD-10-CM | POA: Diagnosis not present

## 2023-11-13 DIAGNOSIS — L03115 Cellulitis of right lower limb: Secondary | ICD-10-CM | POA: Diagnosis not present

## 2023-11-13 DIAGNOSIS — R918 Other nonspecific abnormal finding of lung field: Secondary | ICD-10-CM | POA: Diagnosis not present

## 2023-11-13 DIAGNOSIS — E871 Hypo-osmolality and hyponatremia: Secondary | ICD-10-CM | POA: Diagnosis not present

## 2023-11-13 DIAGNOSIS — E8721 Acute metabolic acidosis: Secondary | ICD-10-CM | POA: Diagnosis not present

## 2023-11-13 DIAGNOSIS — I441 Atrioventricular block, second degree: Secondary | ICD-10-CM | POA: Diagnosis not present

## 2023-11-13 DIAGNOSIS — R609 Edema, unspecified: Secondary | ICD-10-CM | POA: Diagnosis not present

## 2023-11-13 DIAGNOSIS — I509 Heart failure, unspecified: Secondary | ICD-10-CM | POA: Diagnosis not present

## 2023-11-13 DIAGNOSIS — D61818 Other pancytopenia: Secondary | ICD-10-CM | POA: Diagnosis not present

## 2023-11-13 DIAGNOSIS — R4182 Altered mental status, unspecified: Secondary | ICD-10-CM | POA: Diagnosis not present

## 2023-11-13 DIAGNOSIS — A419 Sepsis, unspecified organism: Secondary | ICD-10-CM | POA: Diagnosis not present

## 2023-11-13 DIAGNOSIS — R29818 Other symptoms and signs involving the nervous system: Secondary | ICD-10-CM | POA: Diagnosis not present

## 2023-11-13 DIAGNOSIS — R7402 Elevation of levels of lactic acid dehydrogenase (LDH): Secondary | ICD-10-CM | POA: Diagnosis not present

## 2023-11-13 DIAGNOSIS — G9341 Metabolic encephalopathy: Secondary | ICD-10-CM | POA: Diagnosis not present

## 2023-11-13 DIAGNOSIS — E872 Acidosis, unspecified: Secondary | ICD-10-CM | POA: Diagnosis not present

## 2023-11-13 DIAGNOSIS — G9382 Brain death: Secondary | ICD-10-CM | POA: Diagnosis not present

## 2023-11-13 DIAGNOSIS — R7989 Other specified abnormal findings of blood chemistry: Secondary | ICD-10-CM | POA: Diagnosis not present

## 2023-11-13 DIAGNOSIS — R6521 Severe sepsis with septic shock: Secondary | ICD-10-CM | POA: Diagnosis not present

## 2023-11-13 DIAGNOSIS — D649 Anemia, unspecified: Secondary | ICD-10-CM | POA: Diagnosis not present

## 2023-11-13 DIAGNOSIS — I4430 Unspecified atrioventricular block: Secondary | ICD-10-CM | POA: Diagnosis not present

## 2023-11-13 DIAGNOSIS — I130 Hypertensive heart and chronic kidney disease with heart failure and stage 1 through stage 4 chronic kidney disease, or unspecified chronic kidney disease: Secondary | ICD-10-CM | POA: Diagnosis not present

## 2023-11-13 DIAGNOSIS — I959 Hypotension, unspecified: Secondary | ICD-10-CM | POA: Diagnosis not present

## 2023-11-13 DIAGNOSIS — I5043 Acute on chronic combined systolic (congestive) and diastolic (congestive) heart failure: Secondary | ICD-10-CM | POA: Diagnosis not present

## 2023-11-13 DIAGNOSIS — E8729 Other acidosis: Secondary | ICD-10-CM | POA: Diagnosis not present

## 2023-11-13 DIAGNOSIS — R0689 Other abnormalities of breathing: Secondary | ICD-10-CM | POA: Diagnosis not present

## 2023-11-24 DEATH — deceased

## 2023-12-06 ENCOUNTER — Encounter
Admit: 2023-12-06 | Payer: PRIVATE HEALTH INSURANCE | Attending: Vascular and Interventional Radiology | Primary: Family Medicine

## 2023-12-20 ENCOUNTER — Encounter
Admit: 2023-12-20 | Payer: PRIVATE HEALTH INSURANCE | Attending: Vascular and Interventional Radiology | Primary: Family Medicine

## 2024-02-28 ENCOUNTER — Encounter
Admit: 2024-02-28 | Payer: PRIVATE HEALTH INSURANCE | Attending: Vascular and Interventional Radiology | Primary: Family Medicine

## 2024-03-18 ENCOUNTER — Encounter: Admit: 2024-03-18 | Payer: PRIVATE HEALTH INSURANCE | Attending: Family Medicine | Primary: Family Medicine

## 2024-03-18 MED ORDER — SILDENAFIL 100 MG TABLET
100 | ORAL_TABLET | ORAL | 3 refills | 15.00000 days | Status: AC
Start: 2024-03-18 — End: ?

## 2024-04-11 ENCOUNTER — Encounter
Admit: 2024-04-11 | Payer: PRIVATE HEALTH INSURANCE | Attending: Vascular and Interventional Radiology | Primary: Family Medicine

## 2024-05-15 ENCOUNTER — Encounter: Admit: 2024-05-15 | Payer: PRIVATE HEALTH INSURANCE | Attending: Family Medicine | Primary: Family Medicine

## 2024-05-15 ENCOUNTER — Telehealth: Admit: 2024-05-15 | Payer: PRIVATE HEALTH INSURANCE | Attending: Family Medicine | Primary: Family Medicine

## 2024-05-15 NOTE — Telephone Encounter [36]
 Forms signed by provider Forms up front waiting to be picked up

## 2024-05-15 NOTE — Telephone Encounter [36]
 Put on providers desk

## 2024-05-15 NOTE — Telephone Encounter [36]
 Dropped off a parking placard renewal application for   prson with is disabled.Please call him back when done- he wants to pick up the form with original signature.Form placed in Dr. Johnnette bin up frontCall [phone]

## 2024-05-20 ENCOUNTER — Telehealth: Admit: 2024-05-20 | Payer: PRIVATE HEALTH INSURANCE | Attending: Family Medicine | Primary: Family Medicine

## 2024-05-20 ENCOUNTER — Encounter: Admit: 2024-05-20 | Payer: PRIVATE HEALTH INSURANCE | Attending: Family Medicine | Primary: Family Medicine

## 2024-05-20 ENCOUNTER — Encounter
Admit: 2024-05-20 | Payer: PRIVATE HEALTH INSURANCE | Attending: Vascular and Interventional Radiology | Primary: Family Medicine

## 2024-05-20 NOTE — Telephone Encounter [36]
 Forms dropped off by wife to be signed by Dr. Cristobal. Placed in Dr. Delaney bin at front desk.

## 2024-05-21 NOTE — Telephone Encounter [36]
 Forms given to provider Provider signed Faxed to [facility]

## 2024-05-28 ENCOUNTER — Encounter
Admit: 2024-05-28 | Payer: PRIVATE HEALTH INSURANCE | Attending: Vascular and Interventional Radiology | Primary: Family Medicine

## 2024-06-27 ENCOUNTER — Encounter: Admit: 2024-06-27 | Payer: PRIVATE HEALTH INSURANCE | Attending: Family Medicine | Primary: Family Medicine

## 2024-07-17 ENCOUNTER — Encounter: Admit: 2024-07-17 | Payer: PRIVATE HEALTH INSURANCE | Attending: Family Medicine | Primary: Family Medicine
